# Patient Record
Sex: Female | Born: 1985
Health system: Southern US, Community
[De-identification: ages and names within clinical notes are randomized; demographics above are authoritative.]

## PROBLEM LIST (undated history)

## (undated) ENCOUNTER — Inpatient Hospital Stay (HOSPITAL_COMMUNITY): Payer: Self-pay

## (undated) DIAGNOSIS — Z9889 Other specified postprocedural states: Secondary | ICD-10-CM

## (undated) DIAGNOSIS — D649 Anemia, unspecified: Secondary | ICD-10-CM

## (undated) DIAGNOSIS — R112 Nausea with vomiting, unspecified: Secondary | ICD-10-CM

## (undated) DIAGNOSIS — N189 Chronic kidney disease, unspecified: Secondary | ICD-10-CM

## (undated) DIAGNOSIS — N803 Endometriosis of pelvic peritoneum: Secondary | ICD-10-CM

## (undated) DIAGNOSIS — F419 Anxiety disorder, unspecified: Secondary | ICD-10-CM

## (undated) HISTORY — PX: BREAST ENHANCEMENT SURGERY: SHX7

## (undated) HISTORY — PX: BREAST SURGERY: SHX581

## (undated) HISTORY — DX: Endometriosis of pelvic peritoneum: N80.3

## (undated) HISTORY — DX: Anxiety disorder, unspecified: F41.9

---

## 2004-08-13 ENCOUNTER — Emergency Department (HOSPITAL_COMMUNITY): Admission: EM | Admit: 2004-08-13 | Discharge: 2004-08-14 | Payer: Self-pay | Admitting: Emergency Medicine

## 2008-02-29 ENCOUNTER — Emergency Department (HOSPITAL_COMMUNITY): Admission: EM | Admit: 2008-02-29 | Discharge: 2008-02-29 | Payer: Self-pay | Admitting: Emergency Medicine

## 2008-05-02 ENCOUNTER — Inpatient Hospital Stay (HOSPITAL_COMMUNITY): Admission: AD | Admit: 2008-05-02 | Discharge: 2008-05-02 | Payer: Self-pay | Admitting: Obstetrics

## 2008-05-15 ENCOUNTER — Observation Stay (HOSPITAL_COMMUNITY): Admission: AD | Admit: 2008-05-15 | Discharge: 2008-05-16 | Payer: Self-pay | Admitting: Obstetrics

## 2008-06-14 ENCOUNTER — Inpatient Hospital Stay (HOSPITAL_COMMUNITY): Admission: RE | Admit: 2008-06-14 | Discharge: 2008-06-17 | Payer: Self-pay | Admitting: Obstetrics

## 2010-06-04 ENCOUNTER — Ambulatory Visit (HOSPITAL_COMMUNITY)
Admission: RE | Admit: 2010-06-04 | Discharge: 2010-06-04 | Disposition: A | Discharge status: Home / Self Care | Payer: Self-pay | Source: Ambulatory Visit | Attending: Family Medicine | Admitting: Family Medicine

## 2010-06-04 ENCOUNTER — Other Ambulatory Visit: Payer: Self-pay | Admitting: Family Medicine

## 2010-06-04 DIAGNOSIS — D72829 Elevated white blood cell count, unspecified: Secondary | ICD-10-CM | POA: Insufficient documentation

## 2010-06-04 DIAGNOSIS — R112 Nausea with vomiting, unspecified: Secondary | ICD-10-CM | POA: Insufficient documentation

## 2010-06-04 DIAGNOSIS — K7689 Other specified diseases of liver: Secondary | ICD-10-CM | POA: Insufficient documentation

## 2010-06-04 DIAGNOSIS — R1032 Left lower quadrant pain: Secondary | ICD-10-CM | POA: Insufficient documentation

## 2010-06-04 MED ORDER — IOHEXOL 300 MG/ML  SOLN
100.0000 mL | Freq: Once | INTRAMUSCULAR | Status: AC | PRN
  Administered 2010-06-04: 100 mL via INTRAVENOUS

## 2010-07-24 LAB — CBC
HCT: 36.8 % (ref 36.0–46.0)
Hemoglobin: 12.2 g/dL (ref 12.0–15.0)
Hemoglobin: 9 g/dL — ABNORMAL LOW (ref 12.0–15.0)
MCV: 86.2 fL (ref 78.0–100.0)
Platelets: 289 10*3/uL (ref 150–400)
RBC: 3.1 MIL/uL — ABNORMAL LOW (ref 3.87–5.11)
RDW: 17 % — ABNORMAL HIGH (ref 11.5–15.5)
WBC: 11.7 10*3/uL — ABNORMAL HIGH (ref 4.0–10.5)
WBC: 11.8 10*3/uL — ABNORMAL HIGH (ref 4.0–10.5)

## 2010-07-28 LAB — WET PREP, GENITAL
Trich, Wet Prep: NONE SEEN
Yeast Wet Prep HPF POC: NONE SEEN

## 2010-07-28 LAB — FETAL FIBRONECTIN: Fetal Fibronectin: NEGATIVE

## 2010-07-28 LAB — URINALYSIS, ROUTINE W REFLEX MICROSCOPIC
Bilirubin Urine: NEGATIVE
Glucose, UA: NEGATIVE mg/dL
Hgb urine dipstick: NEGATIVE
Protein, ur: NEGATIVE mg/dL

## 2010-07-29 LAB — CROSSMATCH
ABO/RH(D): O POS
Antibody Screen: NEGATIVE

## 2010-07-29 LAB — CBC
HCT: 31 % — ABNORMAL LOW (ref 36.0–46.0)
Hemoglobin: 10.5 g/dL — ABNORMAL LOW (ref 12.0–15.0)
MCHC: 33.8 g/dL (ref 30.0–36.0)
MCV: 84.3 fL (ref 78.0–100.0)
Platelets: 290 K/uL (ref 150–400)
RBC: 3.68 MIL/uL — ABNORMAL LOW (ref 3.87–5.11)
RDW: 14.1 % (ref 11.5–15.5)
WBC: 13 K/uL — ABNORMAL HIGH (ref 4.0–10.5)

## 2010-08-26 NOTE — Op Note (Signed)
Shelly [name], Shelly [name]             ACCOUNT NO.:  [id number]   MEDICAL RECORD NO.:  [id number]          PATIENT TYPE:  INP   LOCATION:  NA                            FACILITY:  [facility]   PHYSICIAN:  Kathreen Cosier, M.D.DATE OF BIRTH:  02-18-1986   DATE OF PROCEDURE:  06/14/2008  DATE OF DISCHARGE:                               OPERATIVE REPORT   PREOPERATIVE DIAGNOSIS:  Previous cesarean section at term, desires  repeat.   POSTOPERATIVE DIAGNOSES:  Previous cesarean section at term, desires  repeat.   SURGEON:  Kathreen Cosier, M.D.   FIRST ASSISTANT:  Charles A. Clearance Coots, M.D.   ANESTHESIA:  Spinal.   PROCEDURE:  The patient placed on the operating table in supine position  after the spinal administered.  Abdomen prepped and draped. Bladder  emptied with a Foley catheter. Transverse suprapubic incision was made,  carried down to the rectus fascia.  Fascia cleaned and incised to the  length of the incision. Rectus muscles retracted laterally. The  peritoneum was incised longitudinally.  Transverse incision was made in  the visceral peritoneum above the bladder.  Bladder mobilized  inferiorly.  Transverse lower uterine incision was made and the fluid  was clear.  The patient delivered from the OP position of a female, Apgar  9, 9, weighing 7 pounds 8 ounces.  The team was in attendance and the  fluid was cleared.  The placenta was posterior fundal and removed  manually and sent to labor and delivery.  Uterine cavity cleaned with  dry laps.  Uterine incision closed in one layer with continuous suture  of #1 chromic.  Hemostasis satisfactory.  Bladder flap reattached with 2-  0 chromic.  Uterus well contracted.  Tubes and ovaries normal.  Abdomen  closed in layers, peritoneum continuous suture of O chromic fascia,  continuous suture with Dexon, and the skin closed with subcuticular  stitch of 4-0 Monocryl.  Blood loss 400 mL.  The patient tolerated the  procedure well, taken to  recovery room in good condition.           ______________________________  Kathreen Cosier, M.D.     BAM/MEDQ  D:  06/14/2008  T:  06/14/2008  Job:  [id number]

## 2010-08-29 NOTE — Discharge Summary (Signed)
Shelly [name], Shelly [name]             ACCOUNT NO.:  [id number]   MEDICAL RECORD NO.:  [id number]          PATIENT TYPE:  INP   LOCATION:  9117                          FACILITY:  [facility]   PHYSICIAN:  Kathreen Cosier, M.D.DATE OF BIRTH:  [birth year]   DATE OF ADMISSION:  06/14/2008  DATE OF DISCHARGE:  06/17/2008                               DISCHARGE SUMMARY   The patient is a 25 year old gravida 3, para 2-0-2, EDC on June 21, 2008, with 2 previous C-sections, and she was now at term and was in for  a repeat C-section.  During the pregnancy, her hemoglobin fell to 8 and  she was symptomatic and got transfused 2 units of packed cells  understanding the risk of transfusion, this was done on May 15, 2008.  Post-transfusion hemoglobin was 10.  The patient underwent a  repeat low transverse cesarean section on June 14, 2008, and had a female,  Apgar 9 and 9 weighing 7 pounds 8 ounces from the OP position.  Postoperatively, she did well.  Her hemoglobin was 9.  RPR negative.  HIV negative.  She was discharged home on the third postoperative day,  ambulatory on a regular diet, to see me in 6 weeks.  She was on Tylox  for pain and discharge diagnosis status post previous cesarean section  at term desired repeat.           ______________________________  Kathreen Cosier, M.D.     BAM/MEDQ  D:  07/04/2008  T:  07/04/2008  Job:  [id number]

## 2011-01-14 LAB — URINALYSIS, ROUTINE W REFLEX MICROSCOPIC
Glucose, UA: NEGATIVE
Hgb urine dipstick: NEGATIVE
Protein, ur: NEGATIVE
Specific Gravity, Urine: 1.01
pH: 8

## 2011-01-14 LAB — POCT I-STAT, CHEM 8
BUN: 6
Calcium, Ion: 1.18
Chloride: 108
Creatinine, Ser: 0.7
Sodium: 139

## 2011-01-14 LAB — CBC
HCT: 28.5 — ABNORMAL LOW
Hemoglobin: 9.7 — ABNORMAL LOW
RBC: 3.19 — ABNORMAL LOW
RDW: 13.6
WBC: 10.2

## 2011-01-14 LAB — PREGNANCY, URINE: Preg Test, Ur: POSITIVE

## 2011-01-14 LAB — D-DIMER, QUANTITATIVE: D-Dimer, Quant: 0.36

## 2011-01-14 LAB — DIFFERENTIAL
Basophils Absolute: 0
Eosinophils Relative: 1
Lymphocytes Relative: 12
Lymphs Abs: 1.2
Monocytes Absolute: 0.5
Neutro Abs: 8.4 — ABNORMAL HIGH

## 2011-02-25 ENCOUNTER — Other Ambulatory Visit: Payer: Self-pay | Admitting: Family Medicine

## 2011-02-25 ENCOUNTER — Ambulatory Visit
Admission: RE | Admit: 2011-02-25 | Discharge: 2011-02-25 | Disposition: A | Discharge status: Home / Self Care | Payer: Self-pay | Source: Ambulatory Visit | Attending: Family Medicine | Admitting: Family Medicine

## 2011-02-25 DIAGNOSIS — R519 Headache, unspecified: Secondary | ICD-10-CM

## 2011-02-26 ENCOUNTER — Other Ambulatory Visit: Payer: Self-pay

## 2011-09-22 ENCOUNTER — Ambulatory Visit: Payer: Self-pay | Admitting: Physician Assistant

## 2011-09-22 VITALS — BP 108/76 | HR 61 | Temp 98.7°F | Resp 16 | Ht 61.0 in | Wt 115.0 lb

## 2011-09-22 DIAGNOSIS — J029 Acute pharyngitis, unspecified: Secondary | ICD-10-CM

## 2011-09-22 DIAGNOSIS — R059 Cough, unspecified: Secondary | ICD-10-CM

## 2011-09-22 DIAGNOSIS — R05 Cough: Secondary | ICD-10-CM

## 2011-09-22 LAB — POCT RAPID STREP A (OFFICE): Rapid Strep A Screen: NEGATIVE

## 2011-09-22 MED ORDER — AMOXICILLIN 875 MG PO TABS: 875.0000 mg | ORAL_TABLET | Freq: Two times a day (BID) | ORAL | Status: AC

## 2011-09-22 MED ORDER — BENZONATATE 100 MG PO CAPS: 100.0000 mg | ORAL_CAPSULE | Freq: Three times a day (TID) | ORAL | Status: AC | PRN

## 2011-09-22 NOTE — Progress Notes (Signed)
  Subjective:    Patient ID: Shelly [name], female    DOB: April 23, 1985, 26 y.o.   MRN: [id number]  HPI Patient presents with sore throat, fever, chills, and slight cough x 7 days. States her symptoms started as a sore throat and fever. Last documented fever was 09/16/11 and was 100.0. She has had persistent congestion, fatigue, and slight cough. Denies nausea, vomiting, headache, sinus pain/pressure, or otalgia. She has been taking ibuprofen as needed for her sore throat which has not helped. States that at the onset of the illness she had white patches on the back of her throat and it has been difficulty with eating and swallowing.     Review of Systems  All other systems reviewed and are negative.       Objective:   Physical Exam  Constitutional: She is oriented to person, place, and time. She appears well-developed and well-nourished.  HENT:  Head: Normocephalic and atraumatic.  Right Ear: Hearing, tympanic membrane, external ear and ear canal normal.  Left Ear: Hearing, tympanic membrane, external ear and ear canal normal.       Bilateral erythema. No exudate. +clear postnasal drainage.   Eyes: Conjunctivae are normal.  Neck: Normal range of motion. Neck supple.  Cardiovascular: Normal rate, regular rhythm and normal heart sounds.   Pulmonary/Chest: Effort normal and breath sounds normal.  Lymphadenopathy:    She has no cervical adenopathy.  Neurological: She is alert and oriented to person, place, and time.  Skin: Skin is warm and dry.  Psychiatric: She has a normal mood and affect. Her behavior is normal. Judgment and thought content normal.          Assessment & Plan:   1. Acute pharyngitis  Will treat with amoxicillin 875 mg bid x 7 days.  Continue tylenol or ibuprofen as needed for pain.  Follow up in 48 hours if symptoms persist, sooner if worse.  POCT rapid strep A, amoxicillin (AMOXIL) 875 MG tablet  2. Cough  benzonatate (TESSALON) 100 MG capsule

## 2012-02-12 ENCOUNTER — Encounter: Payer: Self-pay | Admitting: Family Medicine

## 2012-02-12 ENCOUNTER — Ambulatory Visit (INDEPENDENT_AMBULATORY_CARE_PROVIDER_SITE_OTHER): Payer: PRIVATE HEALTH INSURANCE | Admitting: Family Medicine

## 2012-02-12 VITALS — BP 110/68 | HR 88 | Temp 99.0°F | Resp 16 | Ht 61.0 in | Wt 117.0 lb

## 2012-02-12 DIAGNOSIS — R5383 Other fatigue: Secondary | ICD-10-CM

## 2012-02-12 DIAGNOSIS — N921 Excessive and frequent menstruation with irregular cycle: Secondary | ICD-10-CM

## 2012-02-12 DIAGNOSIS — R110 Nausea: Secondary | ICD-10-CM

## 2012-02-12 DIAGNOSIS — N920 Excessive and frequent menstruation with regular cycle: Secondary | ICD-10-CM

## 2012-02-12 DIAGNOSIS — R140 Abdominal distension (gaseous): Secondary | ICD-10-CM

## 2012-02-12 DIAGNOSIS — R141 Gas pain: Secondary | ICD-10-CM

## 2012-02-12 LAB — CBC WITH DIFFERENTIAL/PLATELET
HCT: 34.4 % — ABNORMAL LOW (ref 36.0–46.0)
Hemoglobin: 11.8 g/dL — ABNORMAL LOW (ref 12.0–15.0)
Lymphs Abs: 1.6 10*3/uL (ref 0.7–4.0)
Monocytes Absolute: 0.5 10*3/uL (ref 0.1–1.0)
Monocytes Relative: 7 % (ref 3–12)
Neutro Abs: 4.7 10*3/uL (ref 1.7–7.7)
Neutrophils Relative %: 68 % (ref 43–77)
RBC: 4.29 MIL/uL (ref 3.87–5.11)

## 2012-02-12 LAB — TSH: TSH: 1.064 u[IU]/mL (ref 0.350–4.500)

## 2012-02-13 LAB — COMPREHENSIVE METABOLIC PANEL
Albumin: 4.5 g/dL (ref 3.5–5.2)
CO2: 28 mEq/L (ref 19–32)
Calcium: 9.1 mg/dL (ref 8.4–10.5)
Chloride: 103 mEq/L (ref 96–112)
Glucose, Bld: 75 mg/dL (ref 70–99)
Potassium: 3.9 mEq/L (ref 3.5–5.3)
Sodium: 140 mEq/L (ref 135–145)
Total Protein: 7.4 g/dL (ref 6.0–8.3)

## 2012-02-15 LAB — H. PYLORI ANTIBODY, IGG: H Pylori IgG: 0.53 {ISR}

## 2012-02-17 NOTE — Progress Notes (Signed)
  Subjective:    Patient ID: Shelly [name], female    DOB: [birth year], 26 y.o.   MRN: [id number] Chief Complaint  Patient presents with  . Abdominal Pain    HPI  Intermittent RLQ abd pain for the past yr. Not associated w/ menses. Unsure if associated w/ food. Now becoming bloated as well. Normal stools.  Does have a h/o ovarian cysts.  No past medical history on file.   Review of Systems  Constitutional: Positive for activity change, appetite change and fatigue. Negative for fever, chills and unexpected weight change.  Respiratory: Negative for shortness of breath.   Cardiovascular: Negative for chest pain and leg swelling.  Gastrointestinal: Positive for nausea, abdominal pain and abdominal distention. Negative for vomiting, diarrhea, constipation, blood in stool and anal bleeding.  Genitourinary: Positive for vaginal bleeding, menstrual problem and pelvic pain. Negative for dysuria, decreased urine volume, vaginal discharge and difficulty urinating.  Musculoskeletal: Negative for gait problem.  Skin: Negative for rash.  Hematological: Negative for adenopathy. Does not bruise/bleed easily.  Psychiatric/Behavioral: Negative for sleep disturbance.      BP 110/68  Pulse 88  Temp 99 F (37.2 C) (Oral)  Resp 16  Ht 5\' 1"  (1.549 m)  Wt 117 lb (53.071 kg)  BMI 22.11 kg/m2  SpO2 100% Objective:   Physical Exam  Constitutional: She is oriented to person, place, and time. She appears well-developed and well-nourished. No distress.  HENT:  Head: Normocephalic and atraumatic.  Neck: Normal range of motion. Neck supple. No thyromegaly present.  Cardiovascular: Normal rate, regular rhythm, normal heart sounds and intact distal pulses.   Pulmonary/Chest: Effort normal and breath sounds normal. No respiratory distress.  Abdominal: Soft. Bowel sounds are normal. She exhibits no distension and no mass. There is no tenderness. There is no rebound, no guarding and no CVA tenderness.    Musculoskeletal: She exhibits no edema.  Lymphadenopathy:    She has no cervical adenopathy.  Neurological: She is alert and oriented to person, place, and time.  Skin: Skin is warm and dry. She is not diaphoretic. No erythema.  Psychiatric: She has a normal mood and affect. Her behavior is normal.          Assessment & Plan:   1. Bloating  Comprehensive metabolic panel, H. pylori antibody, IgG, Tissue transglutaminase, IgG, Lipase  2. Fatigue  CBC with Differential, Comprehensive metabolic panel, TSH, Lipase  3. Nausea  H. pylori antibody, IgG  4. Metrorrhagia    5. Menorrhagia

## 2012-02-22 ENCOUNTER — Emergency Department (HOSPITAL_COMMUNITY)
Admission: EM | Admit: 2012-02-22 | Discharge: 2012-02-22 | Disposition: A | Discharge status: Home / Self Care | Payer: PRIVATE HEALTH INSURANCE | Attending: Emergency Medicine | Admitting: Emergency Medicine

## 2012-02-22 ENCOUNTER — Encounter (HOSPITAL_COMMUNITY): Payer: Self-pay | Admitting: Emergency Medicine

## 2012-02-22 ENCOUNTER — Emergency Department (HOSPITAL_COMMUNITY): Payer: PRIVATE HEALTH INSURANCE

## 2012-02-22 DIAGNOSIS — N83209 Unspecified ovarian cyst, unspecified side: Secondary | ICD-10-CM | POA: Insufficient documentation

## 2012-02-22 DIAGNOSIS — N83201 Unspecified ovarian cyst, right side: Secondary | ICD-10-CM

## 2012-02-22 DIAGNOSIS — Z3202 Encounter for pregnancy test, result negative: Secondary | ICD-10-CM | POA: Insufficient documentation

## 2012-02-22 DIAGNOSIS — Z9889 Other specified postprocedural states: Secondary | ICD-10-CM | POA: Insufficient documentation

## 2012-02-22 LAB — COMPREHENSIVE METABOLIC PANEL
Alkaline Phosphatase: 53 U/L (ref 39–117)
BUN: 12 mg/dL (ref 6–23)
Chloride: 102 mEq/L (ref 96–112)
GFR calc Af Amer: >90 mL/min (ref >90)
Glucose, Bld: 85 mg/dL (ref 70–99)
Potassium: 3.9 mEq/L (ref 3.5–5.1)
Total Bilirubin: 0.5 mg/dL (ref 0.3–1.2)

## 2012-02-22 LAB — PREGNANCY, URINE: Preg Test, Ur: NEGATIVE

## 2012-02-22 LAB — CBC WITH DIFFERENTIAL/PLATELET
Basophils Relative: 1 % (ref 0–1)
Eosinophils Absolute: 0.1 10*3/uL (ref 0.0–0.7)
Eosinophils Relative: 1 % (ref 0–5)
Lymphs Abs: 1.7 10*3/uL (ref 0.7–4.0)
MCH: 27.7 pg (ref 26.0–34.0)
MCHC: 33.2 g/dL (ref 30.0–36.0)
MCV: 83.7 fL (ref 78.0–100.0)
Platelets: 354 10*3/uL (ref 150–400)
RBC: 4.47 MIL/uL (ref 3.87–5.11)
RDW: 13.5 % (ref 11.5–15.5)

## 2012-02-22 LAB — LIPASE, BLOOD: Lipase: 52 U/L (ref 11–59)

## 2012-02-22 LAB — WET PREP, GENITAL
Trich, Wet Prep: NONE SEEN
Yeast Wet Prep HPF POC: NONE SEEN

## 2012-02-22 LAB — URINALYSIS, MICROSCOPIC ONLY
Bilirubin Urine: NEGATIVE
Ketones, ur: NEGATIVE mg/dL
Nitrite: NEGATIVE
Urobilinogen, UA: 0.2 mg/dL (ref 0.0–1.0)

## 2012-02-22 MED ORDER — ONDANSETRON HCL 4 MG/2ML IJ SOLN
4.0000 mg | Freq: Once | INTRAMUSCULAR | Status: AC
  Administered 2012-02-22: 4 mg via INTRAVENOUS
  Filled 2012-02-22: qty 2

## 2012-02-22 MED ORDER — HYDROCODONE-ACETAMINOPHEN 5-325 MG PO TABS: 1.0000 | ORAL_TABLET | Freq: Four times a day (QID) | ORAL | Status: DC | PRN

## 2012-02-22 NOTE — ED Notes (Signed)
Pt c/o of lower right abdominal pain. States she has had pain there for about a year but was tolerable but has gotten worse since last night. Also states that she has been bloated a lot the past 2 weeks.

## 2012-02-22 NOTE — ED Provider Notes (Signed)
History     CSN: [id number]  Arrival date & time 02/22/12  8756   First MD Initiated Contact with Patient 02/22/12 1011      Chief Complaint  Patient presents with  . Abdominal Pain    (Consider location/radiation/quality/duration/timing/severity/associated sxs/prior treatment) HPI Pt is a 26 yo female with history of an ovarian cyst in 2008 who presents to the ER with abdominal pain.  Pain has been occuring on and off for the past year.  Pain is located in RLQ.  Pt states that at times she has seen bulging in that area.  Pt complains of dyspareunia.  Pt has had very heavy irregular periods for the past year.  Her periods were regular before this.  Pt does not associate pain with menstrual cycle.  Pt is in a monogamous relationship.  Last LMP was 02/05/12 and lasted 6 days.  Pt is not on any birth control.  Pt has also experienced some bloating for the past 2 weeks.  Pt denies vaginal bleeding and discharge, fever, chills chest pain, SOB, diarrhea, flank pain, dysuria, and hematuria.  She has not seen an OB/GYN concerning this issue.  History reviewed. No pertinent past medical history.  Past Surgical History  Procedure Date  . Cesarean section     No family history on file.  History  Substance Use Topics  . Smoking status: Never Smoker   . Smokeless tobacco: Not on file  . Alcohol Use: No    OB History    Grav Para Term Preterm Abortions TAB SAB Ect Mult Living                  Review of Systems All other systems negative except as documented in the HPI. All pertinent positives and negatives as reviewed in the HPI.  Allergies  Review of patient's allergies indicates no known allergies.  Home Medications   Current Outpatient Rx  Name  Route  Sig  Dispense  Refill  . IBUPROFEN 100 MG PO TABS   Oral   Take 100 mg by mouth every 6 (six) hours as needed.           Pulse 111  Temp 98.8 F (37.1 C) (Oral)  Resp 20  SpO2 99%  LMP 02/05/2012  Physical Exam    Constitutional: She is oriented to person, place, and time. She appears well-developed and well-nourished. No distress.  HENT:  Head: Normocephalic and atraumatic.  Mouth/Throat: Oropharynx is clear and moist.  Eyes: Pupils are equal, round, and reactive to light.  Cardiovascular: Normal rate, regular rhythm and normal heart sounds.  Exam reveals no gallop and no friction rub.   No murmur heard. Pulmonary/Chest: Effort normal and breath sounds normal. No respiratory distress.  Abdominal: Soft. Bowel sounds are normal. She exhibits no distension and no mass. There is tenderness in the right lower quadrant. There is no rebound, no guarding and negative Murphy's sign.  Genitourinary: Vagina normal. There is no rash, tenderness or injury on the right labia. There is no rash, tenderness, lesion or injury on the left labia. Cervix exhibits no motion tenderness, no discharge and no friability. Right adnexum displays tenderness. Right adnexum displays no mass and no fullness. Left adnexum displays no mass, no tenderness and no fullness. No erythema, tenderness or bleeding around the vagina. No foreign body around the vagina. No signs of injury around the vagina. No vaginal discharge found.  Neurological: She is alert and oriented to person, place, and time.  Skin: Skin  is warm and dry. No rash noted.    ED Course  Procedures (including critical care time)   Labs Reviewed  CBC WITH DIFFERENTIAL  COMPREHENSIVE METABOLIC PANEL  LIPASE, BLOOD  URINALYSIS, MICROSCOPIC ONLY   The patient has pain in the right lower quadrant that is most likely due to an ovarian cyst.  Patient, states, that she's had this pain, off and on for quite a while.  Patient will be referred to GYN for close followup. Told to return here as needed.   MDM   MDM Reviewed: vitals and nursing note Interpretation: labs and ultrasound          Carlyle Dolly, PA-C 02/22/12 1352

## 2012-02-22 NOTE — ED Notes (Signed)
Pt presenting to ed with c/o lower right side abdominal pain x 1 year. Pt states pain worse last night. Pt states "in 2008 I was diagnosed with a cyst and it feels the same and it's in the same spot" pt states positive nausea no vomiting no diarrhea. Pt denies hematuria pt states normal bowel movement yesterday

## 2012-02-23 LAB — GC/CHLAMYDIA PROBE AMP, GENITAL
Chlamydia, DNA Probe: NEGATIVE
GC Probe Amp, Genital: NEGATIVE

## 2012-02-25 NOTE — ED Provider Notes (Signed)
Medical screening examination/treatment/procedure(s) were performed by non-physician practitioner and as supervising physician I was immediately available for consultation/collaboration.  Nilton Lave T Thane Age, MD 02/25/12 2317 

## 2012-03-03 ENCOUNTER — Encounter: Payer: Self-pay | Admitting: Obstetrics & Gynecology

## 2012-03-03 ENCOUNTER — Ambulatory Visit (INDEPENDENT_AMBULATORY_CARE_PROVIDER_SITE_OTHER): Payer: PRIVATE HEALTH INSURANCE | Admitting: Obstetrics & Gynecology

## 2012-03-03 VITALS — BP 113/78 | HR 98 | Temp 97.5°F | Ht 61.0 in | Wt 116.0 lb

## 2012-03-03 DIAGNOSIS — N83209 Unspecified ovarian cyst, unspecified side: Secondary | ICD-10-CM

## 2012-03-03 DIAGNOSIS — N949 Unspecified condition associated with female genital organs and menstrual cycle: Secondary | ICD-10-CM

## 2012-03-03 DIAGNOSIS — G8929 Other chronic pain: Secondary | ICD-10-CM | POA: Insufficient documentation

## 2012-03-03 MED ORDER — DICLOFENAC SODIUM 75 MG PO TBEC: 75.0000 mg | DELAYED_RELEASE_TABLET | Freq: Two times a day (BID) | ORAL | Status: DC

## 2012-03-03 NOTE — Patient Instructions (Addendum)
Laparoscopy Laparoscopy is a surgical procedure. It is used to diagnose and treat diseases inside the belly(abdomen). It is usually a brief, common, and relatively simple procedure. The laparoscopeis a thin, lighted, pencil-sized instrument. It is like a telescope. It is inserted into your abdomen through a small cut (incision). Your caregiver can look at the organs inside your body through this instrument. He or she can see if there is anything abnormal. Laparoscopy can be done either in a hospital or outpatient clinic. You may be given a mild sedative to help you relax before the procedure. Once in the operating room, you will be given a drug to make you sleep (general anesthesia). Laparoscopy usually lasts less than 1 hour. After the procedure, you will be monitored in a recovery area until you are stable and doing well. Once you are home, it will take 2 to 3 days to fully recover. RISKS AND COMPLICATIONS  Laparoscopy has relatively few risks. Your caregiver will discuss the risks with you before the procedure. Some problems that can occur include:  Infection.  Bleeding.  Damage to other organs.  Anesthetic side effects. PROCEDURE Once you receive anesthesia, your surgeon inflates the abdomen with a harmless gas (carbon dioxide). This makes the organs easier to see. The laparoscope is inserted into the abdomen through a small incision. This allows your surgeon to see into the abdomen. Other small instruments are also inserted into the abdomen through other small openings. Many surgeons attach a video camera to the laparoscope to enlarge the view. During a diagnostic laparoscopy, the surgeon may be looking for inflammation, infection, or cancer. Your surgeon may take tissue samples(biopsies). The samples are sent to a specialist in looking at cells and tissue samples (pathologist). The pathologist examines them under a microscope. Biopsies can help to diagnose or confirm a disease. AFTER THE  PROCEDURE   The gas is released from inside the abdomen.  The incisions are closed with stitches (sutures). Because these incisions are small (usually less than 1/2 inch), there is usually minimal discomfort after the procedure. There may be some mild discomfort in the throat. This is from the tube placed in the throat while you were sleeping. You may have some mild abdominal discomfort. There may also be discomfort from the instrument placement incisions in the abdomen.  The recovery time is shortened as long as there are no complications.  You will rest in a recovery room until stable and doing well. As long as there are no complications, you may be allowed to go home. FINDING OUT THE RESULTS OF YOUR TEST Not all test results are available during your visit. If your test results are not back during the visit, make an appointment with your caregiver to find out the results. Do not assume everything is normal if you have not heard from your caregiver or the medical facility. It is important for you to follow up on all of your test results. HOME CARE INSTRUCTIONS   Take all medicines as directed.  Only take over-the-counter or prescription medicines for pain, discomfort, or fever as directed by your caregiver.  Resume daily activities as directed.  Showers are preferred over baths.  You may resume sexual activities in 1 week or as directed.  Do not drive while taking narcotics. SEEK MEDICAL CARE IF:   There is increasing abdominal pain.  There is new pain in the shoulders (shoulder strap areas).  You feel lightheaded or faint.  You have the chills.  You or your child  has an oral temperature above 102 F (38.9 C).  There is pus-like (purulent) drainage from any of the wounds.  You are unable to pass gas or have a bowel movement.  You feel sick to your stomach (nauseous) or throw up (vomit). MAKE SURE YOU:   Understand these instructions.  Will watch your condition.  Will  get help right away if you are not doing well or get worse. Document Released: 07/06/2000 Document Revised: 06/22/2011 Document Reviewed: 03/30/2007 [facility] Patient Information 2013 [place], [place].   Ovarian Cyst The ovaries are small organs that are on each side of the uterus. The ovaries are the organs that produce the female hormones, estrogen and progesterone. An ovarian cyst is a sac filled with fluid that can vary in its size. It is normal for a small cyst to form in women who are in the childbearing age and who have menstrual periods. This type of cyst is called a follicle cyst that becomes an ovulation cyst (corpus luteum cyst) after it produces the women's egg. It later goes away on its own if the woman does not become pregnant. There are other kinds of ovarian cysts that may cause problems and may need to be treated. The most serious problem is a cyst with cancer. It should be noted that menopausal women who have an ovarian cyst are at a higher risk of it being a cancer cyst. They should be evaluated very quickly, thoroughly and followed closely. This is especially true in menopausal women because of the high rate of ovarian cancer in women in menopause. CAUSES AND TYPES OF OVARIAN CYSTS:  FUNCTIONAL CYST: The follicle/corpus luteum cyst is a functional cyst that occurs every month during ovulation with the menstrual cycle. They go away with the next menstrual cycle if the woman does not get pregnant. Usually, there are no symptoms with a functional cyst.  ENDOMETRIOMA CYST: This cyst develops from the lining of the uterus tissue. This cyst gets in or on the ovary. It grows every month from the bleeding during the menstrual period. It is also called a "chocolate cyst" because it becomes filled with blood that turns brown. This cyst can cause pain in the lower abdomen during intercourse and with your menstrual period.  CYSTADENOMA CYST: This cyst develops from the cells on the outside of the  ovary. They usually are not cancerous. They can get very big and cause lower abdomen pain and pain with intercourse. This type of cyst can twist on itself, cut off its blood supply and cause severe pain. It also can easily rupture and cause a lot of pain.  DERMOID CYST: This type of cyst is sometimes found in both ovaries. They are found to have different kinds of body tissue in the cyst. The tissue includes skin, teeth, hair, and/or cartilage. They usually do not have symptoms unless they get very big. Dermoid cysts are rarely cancerous.  POLYCYSTIC OVARY: This is a rare condition with hormone problems that produces many small cysts on both ovaries. The cysts are follicle-like cysts that never produce an egg and become a corpus luteum. It can cause an increase in body weight, infertility, acne, increase in body and facial hair and lack of menstrual periods or rare menstrual periods. Many women with this problem develop type 2 diabetes. The exact cause of this problem is unknown. A polycystic ovary is rarely cancerous.  THECA LUTEIN CYST: Occurs when too much hormone (human chorionic gonadotropin) is produced and over-stimulates the ovaries to produce  an egg. They are frequently seen when doctors stimulate the ovaries for invitro-fertilization (test tube babies).  LUTEOMA CYST: This cyst is seen during pregnancy. Rarely it can cause an obstruction to the birth canal during labor and delivery. They usually go away after delivery. SYMPTOMS   Pelvic pain or pressure.  Pain during sexual intercourse.  Increasing girth (swelling) of the abdomen.  Abnormal menstrual periods.  Increasing pain with menstrual periods.  You stop having menstrual periods and you are not pregnant. DIAGNOSIS  The diagnosis can be made during:  Routine or annual pelvic examination (common).  Ultrasound.  X-ray of the pelvis.  CT Scan.  MRI.  Blood tests. TREATMENT   Treatment may only be to follow the cyst  monthly for 2 to 3 months with your caregiver. Many go away on their own, especially functional cysts.  May be aspirated (drained) with a long needle with ultrasound, or by laparoscopy (inserting a tube into the pelvis through a small incision).  The whole cyst can be removed by laparoscopy.  Sometimes the cyst may need to be removed through an incision in the lower abdomen.  Hormone treatment is sometimes used to help dissolve certain cysts.  Birth control pills are sometimes used to help dissolve certain cysts. HOME CARE INSTRUCTIONS  Follow your caregiver's advice regarding:  Medicine.  Follow up visits to evaluate and treat the cyst.  You may need to come back or make an appointment with another caregiver, to find the exact cause of your cyst, if your caregiver is not a gynecologist.  Get your yearly and recommended pelvic examinations and Pap tests.  Let your caregiver know if you have had an ovarian cyst in the past. SEEK MEDICAL CARE IF:   Your periods are late, irregular, they stop, or are painful.  Your stomach (abdomen) or pelvic pain does not go away.  Your stomach becomes larger or swollen.  You have pressure on your bladder or trouble emptying your bladder completely.  You have painful sexual intercourse.  You have feelings of fullness, pressure, or discomfort in your stomach.  You lose weight for no apparent reason.  You feel generally ill.  You become constipated.  You lose your appetite.  You develop acne.  You have an increase in body and facial hair.  You are gaining weight, without changing your exercise and eating habits.  You think you are pregnant. SEEK IMMEDIATE MEDICAL CARE IF:   You have increasing abdominal pain.  You feel sick to your stomach (nausea) and/or vomit.  You develop a fever that comes on suddenly.  You develop abdominal pain during a bowel movement.  Your menstrual periods become heavier than usual. Document  Released: 03/30/2005 Document Revised: 06/22/2011 Document Reviewed: 01/31/2009 [facility] Patient Information 2013 [place], [place].

## 2012-03-03 NOTE — Progress Notes (Signed)
History:  26 y.o. G3P3003 here today for followup after being evaluated in the ED on 02/22/12 for chronic pelvic pain, ultrasound showed a 1.8 cm likely corpus luteum cyst.  She reports that she has had pelvic pain, mostly on her right side, for over one year.  She reports daily pain, worse during intercourse, has tried different positions but always has pain.  She is concerned that "something is wrong" and is interested in surgical evaluation for her pain.  She understands that her pain is out of proportion to pain cause by a small cyst, also this pain has been present for over a year.  Patient also reports having infertility problems, she was told she needs a separate appointment to discuss any infertility issues.   The following portions of the patient's history were reviewed and updated as appropriate: allergies, current medications, past family history, past medical history, past social history, past surgical history and problem list.  Review of Systems:  Pertinent items are noted in HPI.  Objective:  Physical Exam Blood pressure 113/78, pulse 98, temperature 97.5 F (36.4 C), temperature source Oral, height 5\' 1"  (1.549 m), weight 116 lb (52.617 kg), last menstrual period 02/05/2012. Gen: NAD Abd: Soft, well-healed Pfannensteil incisions, moderate tenderness in RLQ around her right incisional edge, no masses palpated Pelvic: Normal appearing external genitalia; normal appearing vaginal mucosa and cervix.   Small uterus, normal adnexa. Diffuse uterine and adnexal tenderness on bimanual exam especially on the right.  Labs and Imaging 02/22/2012  TRANSABDOMINAL AND TRANSVAGINAL ULTRASOUND OF PELVIS  Clinical Data: Pelvic pain  Technique:  Both transabdominal and transvaginal ultrasound examinations of the pelvis were performed. Transabdominal technique was performed for global imaging of the pelvis including uterus, ovaries, adnexal regions, and pelvic cul-de-sac.  It was necessary to proceed  with endovaginal exam following the transabdominal exam to visualize the adnexa.  Comparison:  None  Findings:  Uterus: 8.5 x 4.1 x 5.5 cm.  No myometrial mass.  Retroverted.  Endometrium: 13 mm in thickness and uniform.  Right ovary:  1.8 x 1.4 x 0.9 cm complex cyst.  Otherwise within normal limits.  Left ovary: Normal appearance/no adnexal mass  Other findings: Small amount of free fluid is physiologic.  IMPRESSION: Complex cyst in the right ovary 1.8 cm likely a hemorrhagic cyst or involuting functional cyst.  Otherwise the study is within normal limits.   Original Report Authenticated By: Jolaine Click, M.D.    02/22/2012 GC, Chlam, wet prep all negative  Assessment & Plan:  Patient desires surgical evaluation for chronic pelvic pain.  Recommended operative laparoscopy and chromopertubation.  The risks of surgery were discussed in detail with the patient including but not limited to: bleeding which may require transfusion or reoperation; infection which may require prolonged hospitalization or re-hospitalization and antibiotic therapy; injury to bowel, bladder, ureters and major vessels or other surrounding organs; need for additional procedures including laparotomy; thromboembolic phenomenon, incisional problems and other postoperative or anesthesia complications.  Patient was told that the likelihood that her condition and symptoms will be treated effectively with this surgical management was very high; the postoperative expectations were also discussed in detail. The patient also understands the alternative treatment options which were discussed in full, she declined trial of OCPs.  All questions were answered.  She was told that she will be contacted by our surgical scheduler regarding the time and date of her surgery; routine preoperative instructions of having nothing to eat or drink after midnight on the day prior to  surgery and also coming to the hospital 1 1/2 hours prior to her time of surgery were  also emphasized.  She was told she may be called for a preoperative appointment about a week prior to surgery and will be given further preoperative instructions at that visit. Printed patient education handouts about the procedure were given to the patient to review at home.  Patient was given a prescription for Diclofenac for pain. Pain precautions given.

## 2012-03-07 ENCOUNTER — Encounter: Payer: Self-pay | Admitting: Obstetrics & Gynecology

## 2012-03-31 ENCOUNTER — Ambulatory Visit (INDEPENDENT_AMBULATORY_CARE_PROVIDER_SITE_OTHER): Payer: Self-pay | Admitting: Obstetrics & Gynecology

## 2012-03-31 ENCOUNTER — Encounter: Payer: Self-pay | Admitting: Obstetrics & Gynecology

## 2012-03-31 VITALS — BP 110/74 | HR 72 | Temp 98.1°F | Ht 61.0 in | Wt 116.3 lb

## 2012-03-31 DIAGNOSIS — N949 Unspecified condition associated with female genital organs and menstrual cycle: Secondary | ICD-10-CM

## 2012-03-31 DIAGNOSIS — G8929 Other chronic pain: Secondary | ICD-10-CM

## 2012-03-31 DIAGNOSIS — Z01818 Encounter for other preprocedural examination: Secondary | ICD-10-CM

## 2012-03-31 NOTE — Progress Notes (Signed)
History:   26 y.o. G3P3003 here today for preoperative examination for upcoming surgery; she is scheduled for operative laparoscopy, chromopertubation for chronic pelvic pain and infertility.   Patient also reports having infertility problems, she was told she needs a separate appointment to discuss any infertility issues.  The following portions of the patient's history were reviewed and updated as appropriate: allergies, current medications, past family history, past medical history, past social history, past surgical history and problem list.   Review of Systems:   Pertinent items are noted in HPI.   Objective:   Physical Exam  BP 110/74  Pulse 72  Temp 98.1 F (36.7 C) (Oral)  Ht 5' 1" (1.549 m)  Wt 116 lb 4.8 oz (52.753 kg)  BMI 21.97 kg/m2  LMP 03/08/2012 Gen: NAD  Abd: Soft, well-healed Pfannensteil incisions, moderate tenderness in RLQ around her right incisional edge, no masses palpated  Pelvic: Normal appearing external genitalia; normal appearing vaginal mucosa and cervix. Small uterus, normal adnexa. Diffuse uterine and adnexal tenderness on bimanual exam especially on the right.   Labs and Imaging  02/22/2012 TRANSABDOMINAL AND TRANSVAGINAL ULTRASOUND OF PELVIS Clinical Data: Pelvic pain Technique: Both transabdominal and transvaginal ultrasound examinations of the pelvis were performed. Transabdominal technique was performed for global imaging of the pelvis including uterus, ovaries, adnexal regions, and pelvic cul-de-sac. It was necessary to proceed with endovaginal exam following the transabdominal exam to visualize the adnexa. Comparison: None Findings: Uterus: 8.5 x 4.1 x 5.5 cm. No myometrial mass. Retroverted. Endometrium: 13 mm in thickness and uniform. Right ovary: 1.8 x 1.4 x 0.9 cm complex cyst. Otherwise within normal limits. Left ovary: Normal appearance/no adnexal mass Other findings: Small amount of free fluid is physiologic. IMPRESSION: Complex cyst in the right  ovary 1.8 cm likely a hemorrhagic cyst or involuting functional cyst. Otherwise the study is within normal limits.   02/22/2012 GC, Chlam, wet prep all negative   Assessment & Plan:   Patient will undergo operative laparoscopy and chromopertubation. The risks of surgery were re-discussed in detail with the patient including but not limited to: bleeding which may require transfusion or reoperation; infection which may require prolonged hospitalization or re-hospitalization and antibiotic therapy; injury to bowel, bladder, ureters and major vessels or other surrounding organs; need for additional procedures including laparotomy; thromboembolic phenomenon, incisional problems and other postoperative or anesthesia complications. Postoperative expectations were also discussed in detail.  All questions were answered.  Routine preoperative instructions of having nothing to eat or drink after midnight on the day prior to surgery and also coming to the hospital 1 1/2 hours prior to her time of surgery were also emphasized.  Patient has enough pain medications, will take them as needed.   

## 2012-03-31 NOTE — Patient Instructions (Signed)
Return to clinic for any scheduled appointments or for any gynecologic concerns as needed.   

## 2012-04-15 ENCOUNTER — Encounter (HOSPITAL_COMMUNITY): Payer: Self-pay | Admitting: Pharmacist

## 2012-04-18 ENCOUNTER — Encounter: Payer: Self-pay | Admitting: *Deleted

## 2012-04-18 NOTE — Progress Notes (Signed)
Pre-authorization obtained.  #[id number].

## 2012-04-20 ENCOUNTER — Encounter (HOSPITAL_COMMUNITY)
Admission: RE | Admit: 2012-04-20 | Discharge: 2012-04-20 | Disposition: A | Discharge status: Home / Self Care | Payer: PRIVATE HEALTH INSURANCE | Source: Ambulatory Visit | Attending: Obstetrics & Gynecology | Admitting: Obstetrics & Gynecology

## 2012-04-20 ENCOUNTER — Encounter (HOSPITAL_COMMUNITY): Payer: Self-pay

## 2012-04-20 ENCOUNTER — Encounter (HOSPITAL_COMMUNITY): Payer: Self-pay | Admitting: Obstetrics & Gynecology

## 2012-04-20 HISTORY — DX: Other specified postprocedural states: Z98.890

## 2012-04-20 HISTORY — DX: Nausea with vomiting, unspecified: R11.2

## 2012-04-20 LAB — CBC
MCH: 27.2 pg (ref 26.0–34.0)
MCV: 85.1 fL (ref 78.0–100.0)
Platelets: 289 10*3/uL (ref 150–400)
RBC: 4.23 MIL/uL (ref 3.87–5.11)
RDW: 13.3 % (ref 11.5–15.5)

## 2012-04-20 MED ORDER — DEXTROSE 5 % IV SOLN
2.0000 g | INTRAVENOUS | Status: AC
  Administered 2012-04-21: 2000 mg via INTRAVENOUS
  Filled 2012-04-20: qty 2

## 2012-04-20 MED ORDER — DEXTROSE 5 % IV SOLN
2.0000 g | INTRAVENOUS | Status: DC
  Filled 2012-04-20: qty 2

## 2012-04-20 NOTE — Patient Instructions (Signed)
Your procedure is scheduled on:04/21/12  Enter through the Main Entrance at :8am Pick up desk phone and dial [phone] and inform us of your arrival.  Please call [phone] if you have any problems the morning of surgery.  Remember: Do not eat or drink after midnight:tonight   DO NOT wear jewelry, eye make-up, lipstick,body lotion, or dark fingernail polish. Do not shave for 48 hours prior to surgery.   Patients discharged on the day of surgery will not be allowed to drive home.

## 2012-04-21 ENCOUNTER — Ambulatory Visit (HOSPITAL_COMMUNITY): Payer: PRIVATE HEALTH INSURANCE | Admitting: Anesthesiology

## 2012-04-21 ENCOUNTER — Ambulatory Visit (HOSPITAL_COMMUNITY)
Admission: RE | Admit: 2012-04-21 | Discharge: 2012-04-21 | Disposition: A | Discharge status: Home / Self Care | Payer: PRIVATE HEALTH INSURANCE | Source: Ambulatory Visit | Attending: Obstetrics & Gynecology | Admitting: Obstetrics & Gynecology

## 2012-04-21 ENCOUNTER — Encounter (HOSPITAL_COMMUNITY)
Admission: RE | Disposition: A | Discharge status: Home / Self Care | Payer: Self-pay | Source: Ambulatory Visit | Attending: Obstetrics & Gynecology

## 2012-04-21 ENCOUNTER — Encounter (HOSPITAL_COMMUNITY): Payer: Self-pay | Admitting: Anesthesiology

## 2012-04-21 DIAGNOSIS — R102 Pelvic and perineal pain: Secondary | ICD-10-CM

## 2012-04-21 DIAGNOSIS — Z01818 Encounter for other preprocedural examination: Secondary | ICD-10-CM | POA: Insufficient documentation

## 2012-04-21 DIAGNOSIS — N979 Female infertility, unspecified: Secondary | ICD-10-CM | POA: Diagnosis present

## 2012-04-21 DIAGNOSIS — IMO0002 Reserved for concepts with insufficient information to code with codable children: Secondary | ICD-10-CM

## 2012-04-21 DIAGNOSIS — N949 Unspecified condition associated with female genital organs and menstrual cycle: Secondary | ICD-10-CM | POA: Insufficient documentation

## 2012-04-21 DIAGNOSIS — G8929 Other chronic pain: Secondary | ICD-10-CM | POA: Diagnosis present

## 2012-04-21 DIAGNOSIS — N803 Endometriosis of pelvic peritoneum: Secondary | ICD-10-CM | POA: Insufficient documentation

## 2012-04-21 DIAGNOSIS — Z01812 Encounter for preprocedural laboratory examination: Secondary | ICD-10-CM | POA: Insufficient documentation

## 2012-04-21 HISTORY — PX: LAPAROSCOPY: SHX197

## 2012-04-21 HISTORY — PX: LAPAROSCOPIC LYSIS OF ADHESIONS: SHX5905

## 2012-04-21 HISTORY — DX: Endometriosis of pelvic peritoneum: N80.3

## 2012-04-21 HISTORY — DX: Reserved for concepts with insufficient information to code with codable children: IMO0002

## 2012-04-21 HISTORY — PX: CHROMOPERTUBATION: SHX6288

## 2012-04-21 SURGERY — LAPAROSCOPY OPERATIVE
Anesthesia: General | Site: Vagina | Wound class: Clean

## 2012-04-21 MED ORDER — MIDAZOLAM HCL 5 MG/5ML IJ SOLN
INTRAMUSCULAR | Status: DC | PRN
  Administered 2012-04-21: 2 mg via INTRAVENOUS

## 2012-04-21 MED ORDER — LIDOCAINE HCL (CARDIAC) 20 MG/ML IV SOLN
INTRAVENOUS | Status: AC
  Filled 2012-04-21: qty 5

## 2012-04-21 MED ORDER — NEOSTIGMINE METHYLSULFATE 1 MG/ML IJ SOLN
INTRAMUSCULAR | Status: AC
  Filled 2012-04-21: qty 1

## 2012-04-21 MED ORDER — OXYCODONE-ACETAMINOPHEN 5-325 MG PO TABS: 1.0000 | ORAL_TABLET | Freq: Four times a day (QID) | ORAL | Status: DC | PRN

## 2012-04-21 MED ORDER — METHYLENE BLUE 1 % INJ SOLN
INTRAMUSCULAR | Status: AC
  Filled 2012-04-21: qty 1

## 2012-04-21 MED ORDER — GLYCOPYRROLATE 0.2 MG/ML IJ SOLN
INTRAMUSCULAR | Status: DC | PRN
  Administered 2012-04-21: 0.4 mg via INTRAVENOUS

## 2012-04-21 MED ORDER — DEXAMETHASONE SODIUM PHOSPHATE 10 MG/ML IJ SOLN
INTRAMUSCULAR | Status: DC | PRN
  Administered 2012-04-21: 10 mg via INTRAVENOUS

## 2012-04-21 MED ORDER — KETOROLAC TROMETHAMINE 30 MG/ML IJ SOLN
INTRAMUSCULAR | Status: AC
  Filled 2012-04-21: qty 1

## 2012-04-21 MED ORDER — NEOSTIGMINE METHYLSULFATE 1 MG/ML IJ SOLN
INTRAMUSCULAR | Status: DC | PRN
  Administered 2012-04-21: 2 mg via INTRAVENOUS

## 2012-04-21 MED ORDER — IBUPROFEN 600 MG PO TABS: 600.0000 mg | ORAL_TABLET | Freq: Four times a day (QID) | ORAL | Status: DC | PRN

## 2012-04-21 MED ORDER — ONDANSETRON HCL 4 MG/2ML IJ SOLN
INTRAMUSCULAR | Status: DC | PRN
  Administered 2012-04-21: 4 mg via INTRAVENOUS

## 2012-04-21 MED ORDER — FENTANYL CITRATE 0.05 MG/ML IJ SOLN
INTRAMUSCULAR | Status: DC | PRN
  Administered 2012-04-21: 100 ug via INTRAVENOUS
  Administered 2012-04-21 (×3): 50 ug via INTRAVENOUS

## 2012-04-21 MED ORDER — ONDANSETRON HCL 4 MG/2ML IJ SOLN
INTRAMUSCULAR | Status: AC
  Filled 2012-04-21: qty 2

## 2012-04-21 MED ORDER — GLYCOPYRROLATE 0.2 MG/ML IJ SOLN
INTRAMUSCULAR | Status: AC
  Filled 2012-04-21: qty 2

## 2012-04-21 MED ORDER — KETOROLAC TROMETHAMINE 30 MG/ML IJ SOLN: 15.0000 mg | Freq: Once | INTRAMUSCULAR | Status: DC | PRN

## 2012-04-21 MED ORDER — MUPIROCIN 2 % EX OINT
TOPICAL_OINTMENT | CUTANEOUS | Status: AC
  Filled 2012-04-21: qty 22

## 2012-04-21 MED ORDER — ROCURONIUM BROMIDE 50 MG/5ML IV SOLN
INTRAVENOUS | Status: AC
  Filled 2012-04-21: qty 1

## 2012-04-21 MED ORDER — METHYLENE BLUE 1 % INJ SOLN
INTRAMUSCULAR | Status: DC | PRN
  Administered 2012-04-21: 1 mL

## 2012-04-21 MED ORDER — MUPIROCIN 2 % EX OINT
TOPICAL_OINTMENT | Freq: Two times a day (BID) | CUTANEOUS | Status: DC
  Administered 2012-04-21: 08:00:00 via NASAL

## 2012-04-21 MED ORDER — BUPIVACAINE HCL (PF) 0.5 % IJ SOLN
INTRAMUSCULAR | Status: DC | PRN
  Administered 2012-04-21: 8 mL

## 2012-04-21 MED ORDER — DEXAMETHASONE SODIUM PHOSPHATE 10 MG/ML IJ SOLN
INTRAMUSCULAR | Status: AC
  Filled 2012-04-21: qty 1

## 2012-04-21 MED ORDER — PROMETHAZINE HCL 25 MG/ML IJ SOLN: 6.2500 mg | INTRAMUSCULAR | Status: DC | PRN

## 2012-04-21 MED ORDER — BUPIVACAINE HCL (PF) 0.5 % IJ SOLN
INTRAMUSCULAR | Status: AC
  Filled 2012-04-21: qty 30

## 2012-04-21 MED ORDER — LACTATED RINGERS IR SOLN
Status: DC | PRN
  Administered 2012-04-21: 3000 mL

## 2012-04-21 MED ORDER — MIDAZOLAM HCL 2 MG/2ML IJ SOLN: 0.5000 mg | Freq: Once | INTRAMUSCULAR | Status: DC | PRN

## 2012-04-21 MED ORDER — DOCUSATE SODIUM 100 MG PO CAPS: 100.0000 mg | ORAL_CAPSULE | Freq: Two times a day (BID) | ORAL | Status: DC | PRN

## 2012-04-21 MED ORDER — MIDAZOLAM HCL 2 MG/2ML IJ SOLN
INTRAMUSCULAR | Status: AC
  Filled 2012-04-21: qty 2

## 2012-04-21 MED ORDER — MUPIROCIN 2 % EX OINT: TOPICAL_OINTMENT | Freq: Two times a day (BID) | CUTANEOUS | Status: DC

## 2012-04-21 MED ORDER — PROPOFOL 10 MG/ML IV EMUL
INTRAVENOUS | Status: AC
  Filled 2012-04-21: qty 20

## 2012-04-21 MED ORDER — PROPOFOL 10 MG/ML IV BOLUS
INTRAVENOUS | Status: DC | PRN
  Administered 2012-04-21: 150 mg via INTRAVENOUS

## 2012-04-21 MED ORDER — MEPERIDINE HCL 25 MG/ML IJ SOLN
INTRAMUSCULAR | Status: AC
  Administered 2012-04-21: 12.5 mg via INTRAVENOUS
  Filled 2012-04-21: qty 1

## 2012-04-21 MED ORDER — FENTANYL CITRATE 0.05 MG/ML IJ SOLN
INTRAMUSCULAR | Status: AC
  Filled 2012-04-21: qty 5

## 2012-04-21 MED ORDER — ROCURONIUM BROMIDE 100 MG/10ML IV SOLN
INTRAVENOUS | Status: DC | PRN
  Administered 2012-04-21: 30 mg via INTRAVENOUS

## 2012-04-21 MED ORDER — KETOROLAC TROMETHAMINE 30 MG/ML IJ SOLN
INTRAMUSCULAR | Status: DC | PRN
  Administered 2012-04-21: 30 mg via INTRAVENOUS

## 2012-04-21 MED ORDER — LACTATED RINGERS IV SOLN
INTRAVENOUS | Status: DC | PRN
  Administered 2012-04-21 (×2): via INTRAVENOUS

## 2012-04-21 MED ORDER — FENTANYL CITRATE 0.05 MG/ML IJ SOLN: 25.0000 ug | INTRAMUSCULAR | Status: DC | PRN

## 2012-04-21 MED ORDER — LACTATED RINGERS IV SOLN
INTRAVENOUS | Status: DC
  Administered 2012-04-21: 09:00:00 via INTRAVENOUS

## 2012-04-21 MED ORDER — MEPERIDINE HCL 25 MG/ML IJ SOLN
6.2500 mg | INTRAMUSCULAR | Status: DC | PRN
  Administered 2012-04-21: 12.5 mg via INTRAVENOUS

## 2012-04-21 MED ORDER — LIDOCAINE HCL (CARDIAC) 20 MG/ML IV SOLN
INTRAVENOUS | Status: DC | PRN
  Administered 2012-04-21: 50 mg via INTRAVENOUS

## 2012-04-21 SURGICAL SUPPLY — 30 items
BAG SPEC RTRVL LRG 6X4 10 (ENDOMECHANICALS)
CABLE HIGH FREQUENCY MONO STRZ (ELECTRODE) IMPLANT
CHLORAPREP W/TINT 26ML (MISCELLANEOUS) ×3 IMPLANT
CLOTH BEACON ORANGE TIMEOUT ST (SAFETY) ×3 IMPLANT
FORCEPS CUTTING 33CM 5MM (CUTTING FORCEPS) IMPLANT
GLOVE BIO SURGEON STRL SZ7 (GLOVE) ×3 IMPLANT
GLOVE BIOGEL PI IND STRL 7.0 (GLOVE) ×4 IMPLANT
GLOVE BIOGEL PI INDICATOR 7.0 (GLOVE) ×2
GOWN PREVENTION PLUS LG XLONG (DISPOSABLE) ×4 IMPLANT
GOWN STRL REIN XL XLG (GOWN DISPOSABLE) ×4 IMPLANT
NDL INSUFFLATION 14GA 120MM (NEEDLE) ×2 IMPLANT
NEEDLE GYRUS 33CM (NEEDLE) IMPLANT
NEEDLE INSUFFLATION 14GA 120MM (NEEDLE) ×3 IMPLANT
NS IRRIG 1000ML POUR BTL (IV SOLUTION) ×2 IMPLANT
PACK LAPAROSCOPY BASIN (CUSTOM PROCEDURE TRAY) ×3 IMPLANT
POUCH SPECIMEN RETRIEVAL 10MM (ENDOMECHANICALS) IMPLANT
PROTECTOR NERVE ULNAR (MISCELLANEOUS) ×4 IMPLANT
SEALER TISSUE G2 CVD JAW 35 (ENDOMECHANICALS) IMPLANT
SEALER TISSUE G2 CVD JAW 45CM (ENDOMECHANICALS)
SET IRRIG TUBING LAPAROSCOPIC (IRRIGATION / IRRIGATOR) ×1 IMPLANT
SUT VIC AB 3-0 X1 27 (SUTURE) IMPLANT
SUT VICRYL 0 UR6 27IN ABS (SUTURE) ×6 IMPLANT
SUT VICRYL 4-0 PS2 18IN ABS (SUTURE) ×3 IMPLANT
SYR 50ML LL SCALE MARK (SYRINGE) ×2 IMPLANT
TOWEL OR 17X24 6PK STRL BLUE (TOWEL DISPOSABLE) ×6 IMPLANT
TRAY FOLEY CATH 14FR (SET/KITS/TRAYS/PACK) ×3 IMPLANT
TROCAR XCEL NON-BLD 11X100MML (ENDOMECHANICALS) ×3 IMPLANT
TROCAR XCEL NON-BLD 5MMX100MML (ENDOMECHANICALS) ×6 IMPLANT
WARMER LAPAROSCOPE (MISCELLANEOUS) ×3 IMPLANT
WATER STERILE IRR 1000ML POUR (IV SOLUTION) ×2 IMPLANT

## 2012-04-21 NOTE — Transfer of Care (Signed)
Immediate Anesthesia Transfer of Care Note  Patient: Shelly [name]  Procedure(s) Performed: Procedure(s) (LRB) with comments: LAPAROSCOPY OPERATIVE (N/A) - Peritoneal biopsies CHROMOPERTUBATION (N/A) LAPAROSCOPIC LYSIS OF ADHESIONS (N/A)  Patient Location: PACU  Anesthesia Type:General  Level of Consciousness: sedated  Airway & Oxygen Therapy: Patient Spontanous Breathing and Patient connected to nasal cannula oxygen  Post-op Assessment: Report given to PACU RN and Post -op Vital signs reviewed and stable  Post vital signs: stable  Complications: No apparent anesthesia complications

## 2012-04-21 NOTE — Anesthesia Postprocedure Evaluation (Signed)
Anesthesia Post Note  Patient: Shelly [name]  Procedure(s) Performed: Procedure(s) (LRB): LAPAROSCOPY OPERATIVE (N/A) CHROMOPERTUBATION (N/A) LAPAROSCOPIC LYSIS OF ADHESIONS (N/A)  Anesthesia type: GA  Patient location: PACU  Post pain: Pain level controlled  Post assessment: Post-op Vital signs reviewed  Last Vitals:  Filed Vitals:   04/21/12 1045  BP: 111/74  Pulse: 62  Temp:   Resp: 14    Post vital signs: Reviewed  Level of consciousness: sedated  Complications: No apparent anesthesia complications

## 2012-04-21 NOTE — Op Note (Addendum)
Shelly [name] PROCEDURE DATE: 04/21/2012  PREOPERATIVE DIAGNOSIS: Chronic pelvic pain, secondary infertility POSTOPERATIVE DIAGNOSIS: The same PROCEDURE: Laparoscopy,peritoneal biopsies, lysis of adhesions, chromopertubation SURGEON:  Dr. Jaynie Collins ASSISTANT: Dr. Nicholaus Bloom  INDICATIONS: 27 y.o. [id number] with history of chronic pelvic pain and secondary infertility desiring surgical evaluation.   Please see preoperative notes for further details.  Risks of surgery were discussed with the patient including but not limited to: bleeding which may require transfusion or reoperation; infection which may require antibiotics; injury to bowel, bladder, ureters or other surrounding organs; need for additional procedures including laparotomy; thromboembolic phenomenon, incisional problems and other postoperative/anesthesia complications. Written informed consent was obtained.    FINDINGS:  Small uterus, normal appearance of ovaries and fallopian tubes bilaterally.  Chromopertubation revealed patency of the left fallopian tube but no patency on the right fallopian tube after after 180 ml instillation of diluted methylene blue solution. There is diffuse evidence of red-colored probable endometriosis lesions in the on the right pelvic side wall and posterior cul-de-sac, and diffuse thin adhesions which were lysed.  Peritoneal biopsies were taken and sent to pathology.  The posterior surface of her bladder was very adherent to her anterior lower segment which is expected given her history of three previous cesarean sections.  No other abdominal/pelvic abnormality.  Normal upper abdomen.  ANESTHESIA:    General INTRAVENOUS FLUIDS: 1100 ml ESTIMATED BLOOD LOSS: 10 ml URINE OUTPUT: 300 ml SPECIMENS: Peritoneal biopsies COMPLICATIONS: None immediate  PROCEDURE IN DETAIL:  The patient had sequential compression devices applied to her lower extremities while in the preoperative area.  She was then taken to the  operating room where general anesthesia was administered and was found to be adequate.  She was placed in the dorsal lithotomy position, and was prepped and draped in a sterile manner.  A Foley catheter was inserted into her bladder and attached to constant drainage and a Kahn uterine manipulator was then advanced into the uterus .  After an adequate timeout was performed, attention was then turned to the patient's abdomen where a 10-mm skin incision was made in the umbilical fold.  The Veress needle was carefully introduced into the peritoneal cavity at a 45-degree angle into the abdominal wall.  Intraperitoneal placement was confirmed by drop in intraabdominal pressure with insufflation of carbon dioxide gas.  Adequate pneumoperitoneum was obtained, and the 10-mm trocar and sleeve were then advanced without difficulty into the abdomen where intraabdominal placement was confirmed by the laparoscope. Bilateral 5-mm lower quadrant ports were placed under direct visualization.  A detailed survey of the patient's pelvis and abdomen revealed the findings as mentioned above.   Biopsy forceps were used to take peritoneal biopsies.  The thin adhesions were lysed bluntly.  The operative site was surveyed, and it was found to be hemostatic.  No intraoperative injury to surrounding organs was noted.  Chromopertubation was performed which revealed a patent left fallopian tube, but blocked right fallopian tube.  Irrigation of the pelvis was then performed. The abdomen was desufflated and all instruments were then removed from the patient's abdomen. The uterine manipulator was removed without complications.  All incisions were closed with 3-0 Vicryl subcuticular stitches and Dermabond. The patient tolerated the procedures well.  All instruments, needles, and sponge counts were correct x 2. The patient was taken to the recovery room in stable condition.   The patient will be discharged to home as per PACU criteria.  Routine  postoperative instructions given.  She was prescribed Percocet, Ibuprofen  and Colace.  She will follow up in the clinic on 05/12/12 for postoperative evaluation .

## 2012-04-21 NOTE — H&P (View-Only) (Signed)
History:   27 y.o. [id number] here today for preoperative examination for upcoming surgery; she is scheduled for operative laparoscopy, chromopertubation for chronic pelvic pain and infertility.   Patient also reports having infertility problems, she was told she needs a separate appointment to discuss any infertility issues.  The following portions of the patient's history were reviewed and updated as appropriate: allergies, current medications, past family history, past medical history, past social history, past surgical history and problem list.   Review of Systems:   Pertinent items are noted in HPI.   Objective:   Physical Exam  BP 110/74  Pulse 72  Temp 98.1 F (36.7 C) (Oral)  Ht 5\' 1"  (1.549 m)  Wt 116 lb 4.8 oz (52.753 kg)  BMI 21.97 kg/m2  LMP 03/08/2012 Gen: NAD  Abd: Soft, well-healed Pfannensteil incisions, moderate tenderness in RLQ around her right incisional edge, no masses palpated  Pelvic: Normal appearing external genitalia; normal appearing vaginal mucosa and cervix. Small uterus, normal adnexa. Diffuse uterine and adnexal tenderness on bimanual exam especially on the right.   Labs and Imaging  02/22/2012 TRANSABDOMINAL AND TRANSVAGINAL ULTRASOUND OF PELVIS Clinical Data: Pelvic pain Technique: Both transabdominal and transvaginal ultrasound examinations of the pelvis were performed. Transabdominal technique was performed for global imaging of the pelvis including uterus, ovaries, adnexal regions, and pelvic cul-de-sac. It was necessary to proceed with endovaginal exam following the transabdominal exam to visualize the adnexa. Comparison: None Findings: Uterus: 8.5 x 4.1 x 5.5 cm. No myometrial mass. Retroverted. Endometrium: 13 mm in thickness and uniform. Right ovary: 1.8 x 1.4 x 0.9 cm complex cyst. Otherwise within normal limits. Left ovary: Normal appearance/no adnexal mass Other findings: Small amount of free fluid is physiologic. IMPRESSION: Complex cyst in the right  ovary 1.8 cm likely a hemorrhagic cyst or involuting functional cyst. Otherwise the study is within normal limits.   02/22/2012 GC, Chlam, wet prep all negative   Assessment & Plan:   Patient will undergo operative laparoscopy and chromopertubation. The risks of surgery were re-discussed in detail with the patient including but not limited to: bleeding which may require transfusion or reoperation; infection which may require prolonged hospitalization or re-hospitalization and antibiotic therapy; injury to bowel, bladder, ureters and major vessels or other surrounding organs; need for additional procedures including laparotomy; thromboembolic phenomenon, incisional problems and other postoperative or anesthesia complications. Postoperative expectations were also discussed in detail.  All questions were answered.  Routine preoperative instructions of having nothing to eat or drink after midnight on the day prior to surgery and also coming to the hospital 1 1/2 hours prior to her time of surgery were also emphasized.  Patient has enough pain medications, will take them as needed.

## 2012-04-21 NOTE — Anesthesia Preprocedure Evaluation (Signed)
Anesthesia Evaluation  Patient identified by MRN, date of birth, ID band Patient awake    Reviewed: Allergy & Precautions, H&P , Patient's Chart, lab work & pertinent test results, reviewed documented beta blocker date and time   History of Anesthesia Complications (+) PONV  Airway Mallampati: II TM Distance: >3 FB Neck ROM: full    Dental No notable dental hx.    Pulmonary neg pulmonary ROS,  breath sounds clear to auscultation  Pulmonary exam normal       Cardiovascular Exercise Tolerance: Good negative cardio ROS  Rhythm:regular Rate:Normal     Neuro/Psych negative neurological ROS  negative psych ROS   GI/Hepatic negative GI ROS, Neg liver ROS,   Endo/Other  negative endocrine ROS  Renal/GU negative Renal ROS     Musculoskeletal   Abdominal   Peds  Hematology negative hematology ROS (+)   Anesthesia Other Findings   Reproductive/Obstetrics negative OB ROS                           Anesthesia Physical Anesthesia Plan  ASA: I  Anesthesia Plan: General ETT   Post-op Pain Management:    Induction:   Airway Management Planned:   Additional Equipment:   Intra-op Plan:   Post-operative Plan:   Informed Consent: I have reviewed the patients History and Physical, chart, labs and discussed the procedure including the risks, benefits and alternatives for the proposed anesthesia with the patient or authorized representative who has indicated his/her understanding and acceptance.   Dental Advisory Given  Plan Discussed with: CRNA and Surgeon  Anesthesia Plan Comments:         Anesthesia Quick Evaluation

## 2012-04-21 NOTE — Interval H&P Note (Signed)
History and Physical Interval Note 04/21/2012 8:24 AM  Shelly [name]  has presented today for surgery, with the diagnosis of chronic pelvic pain and secondary infertility  The various methods of treatment have been discussed with the patient and family. After consideration of risks, benefits and other options for treatment, the patient has consented to  Procedure(s): LAPAROSCOPY OPERATIVE (N/A) - chromopertubation/lysis of adhesion as a surgical intervention .  The patient's history has been reviewed, patient examined, no change in status, stable for surgery.  I have reviewed the patient's chart and labs.  Questions were answered to the patient's satisfaction.  To OR when ready.  Jaynie Collins, MD, FACOG Attending Obstetrician & Gynecologist Faculty Practice, [facility] [place]

## 2012-04-22 ENCOUNTER — Encounter (HOSPITAL_COMMUNITY): Payer: Self-pay | Admitting: Obstetrics & Gynecology

## 2012-04-22 ENCOUNTER — Encounter: Payer: Self-pay | Admitting: Obstetrics & Gynecology

## 2012-05-12 ENCOUNTER — Ambulatory Visit (INDEPENDENT_AMBULATORY_CARE_PROVIDER_SITE_OTHER): Payer: PRIVATE HEALTH INSURANCE | Admitting: Obstetrics & Gynecology

## 2012-05-12 ENCOUNTER — Encounter: Payer: Self-pay | Admitting: Obstetrics & Gynecology

## 2012-05-12 VITALS — BP 118/85 | HR 96 | Temp 99.8°F | Ht 61.0 in | Wt 119.6 lb

## 2012-05-12 DIAGNOSIS — N803 Endometriosis of pelvic peritoneum, unspecified: Secondary | ICD-10-CM

## 2012-05-12 DIAGNOSIS — G8929 Other chronic pain: Secondary | ICD-10-CM

## 2012-05-12 DIAGNOSIS — N949 Unspecified condition associated with female genital organs and menstrual cycle: Secondary | ICD-10-CM

## 2012-05-12 DIAGNOSIS — IMO0002 Reserved for concepts with insufficient information to code with codable children: Secondary | ICD-10-CM

## 2012-05-12 DIAGNOSIS — Z09 Encounter for follow-up examination after completed treatment for conditions other than malignant neoplasm: Secondary | ICD-10-CM

## 2012-05-12 DIAGNOSIS — N979 Female infertility, unspecified: Secondary | ICD-10-CM

## 2012-05-12 NOTE — Progress Notes (Signed)
Here for post op visit. States had diarrhea 1/26 and 1/27 and has had low fever up to 100.3 05/09/12, but states has felt hot today and yesterday but hasn't checked temperature.

## 2012-05-12 NOTE — Patient Instructions (Signed)
  Endometriosis  Endometriosis is a disease that occurs when the endometrium (lining of the uterus) is misplaced outside of its normal location. It may occur in many locations close to the uterus (womb), but commonly on the ovaries, fallopian tubes, vagina (birth canal) and bowel located close to the uterus. Because the uterus sloughs (expels) its lining every month (menses), there is bleeding whereever the endometrial tissue is located.  SYMPTOMS   Often there are no symptoms. However, because blood is irritating to tissues not normally exposed to it, when symptoms occur they vary with the location of the misplaced endometrium. Symptoms often include back and abdominal pain. Periods may be heavier and intercourse may be painful. Infertility may be present. You may have all of these symptoms at one time or another or you may have months with no symptoms at all. Although the symptoms occur mainly during menses, they can occur mid-cycle as well, and usually terminate with menopause.  DIAGNOSIS   Your caregiver may recommend a blood test and urine test (urinalysis) to help rule out other conditions. Another common test is ultrasound, a painless procedure that uses sound waves to make a sonogram "picture" of abnormal tissue that could be endometriosis. If your bowel movements are painful around your periods, your caregiver may advise a barium enema (an X-ray of the lower bowel), to try to find the source of your pain. This is sometimes confirmed by laparoscopy. Laparoscopy is a procedure where your caregiver looks into your abdomen with a laparoscope (a small pencil sized telescope). Your caregiver may take a tiny piece of tissue (biopsy) from any abnormal tissue to confirm or document your problem. These tissues are sent to the lab and a pathologist looks at them under the microscope to give a microscopic diagnosis.  TREATMENT   Once the diagnosis is made, it can be treated by destruction of the misplaced endometrial  tissue using heat (diathermy), laser, cutting (excision), or chemical means. It may also be treated with hormonal therapy. When using hormonal therapy menses are eliminated, therefore eliminating the monthly exposure to blood by the misplaced endometrial tissue. Only in severe cases is it necessary to perform a hysterectomy with removal of the tubes, uterus and ovaries.  HOME CARE INSTRUCTIONS    Only take over-the-counter or prescription medicines for pain, discomfort, or fever as directed by your caregiver.   Avoid activities that produce pain, including a physical sexual relationship.   Do not take aspirin as this may increase bleeding when not on hormonal therapy.   See your caregiver for pain or problems not controlled with treatment.  SEEK IMMEDIATE MEDICAL CARE IF:    Your pain is severe and is not responding to pain medicine.   You develop severe nausea and vomiting, or you cannot keep foods down.   Your pain localizes to the right lower part of your abdomen (possible appendicitis).   You have swelling or increasing pain in the abdomen.   You have a fever.   You see blood in your stool.  MAKE SURE YOU:    Understand these instructions.   Will watch your condition.   Will get help right away if you are not doing well or get worse.  Document Released: 03/27/2000 Document Revised: 06/22/2011 Document Reviewed: 11/16/2007  [employer] Patient Information 2013 [employer]

## 2012-05-12 NOTE — Progress Notes (Signed)
  Subjective:     Shelly [name] is a 27 y.o. [id number] female who presents to the clinic status post operative laparoscopy,peritoneal biopsies, lysis of adhesions, chromopertubation for chronic pelvic pain and infertility.  Eating a regular diet without difficulty. Reports having low grade fever yesterday with loose stool, possible gastroenteritis.  The patient is not having any pain.  The following portions of the patient's history were reviewed and updated as appropriate: allergies, current medications, past family history, past medical history, past social history, past surgical history and problem list.  Review of Systems Pertinent items are noted in HPI.    Objective:    BP 118/85  Pulse 96  Temp 99.8 F (37.7 C)  Ht 5\' 1"  (1.549 m)  Wt 119 lb 9.6 oz (54.25 kg)  BMI 22.60 kg/m2  LMP 04/09/2012 General:  alert and no distress  Abdomen: soft, bowel sounds active, non-tender  Incision:   healing well, no drainage, no erythema,  no dehiscence, incision well approximated     Pathology (04/21/2012) 1. Peritoneum, biopsy - FIBROTIC TISSUE. - NO EVIDENCE OF ATYPIA OR MALIGNANCY. 2. Cul-de-sac biopsy, left - FIBROTIC TISSUE WITH MESOTHELIAL LINED CYST AND FOCAL ENDOMETRIOSIS. - NO EVIDENCE OF ATYPIA OR MALIGNANC  Assessment:    Doing well postoperatively. Operative findings again reviewed. Pathology report discussed.  Chromopertubation showed nonpatent right fallopian tube.   Plan:   Discussed management options for endometriosis and infertility; all questions answered. Patient not interested in hormonal management or further surgery at this point as she wants to have another child.  She has been trying for over a year, has used ovulation predictor kits.  Recommended referall to Dr. April Manson ([facility]), patient agreed. Will follow up his recommendations.

## 2012-05-13 NOTE — Progress Notes (Unsigned)
Faxed via EPIC visit note and OP note from 05/13/11 from Dr. Macon Large for appointment scheduled with Dr. April Manson on Weds. March 19 at 8:00am.

## 2012-05-18 ENCOUNTER — Ambulatory Visit (INDEPENDENT_AMBULATORY_CARE_PROVIDER_SITE_OTHER): Payer: PRIVATE HEALTH INSURANCE | Admitting: Family Medicine

## 2012-05-18 VITALS — BP 134/86 | HR 62 | Temp 98.6°F | Resp 16 | Ht 62.0 in | Wt 118.0 lb

## 2012-05-18 DIAGNOSIS — R82998 Other abnormal findings in urine: Secondary | ICD-10-CM

## 2012-05-18 DIAGNOSIS — R8281 Pyuria: Secondary | ICD-10-CM

## 2012-05-18 DIAGNOSIS — R110 Nausea: Secondary | ICD-10-CM

## 2012-05-18 DIAGNOSIS — R109 Unspecified abdominal pain: Secondary | ICD-10-CM

## 2012-05-18 DIAGNOSIS — R51 Headache: Secondary | ICD-10-CM

## 2012-05-18 DIAGNOSIS — R509 Fever, unspecified: Secondary | ICD-10-CM

## 2012-05-18 DIAGNOSIS — R319 Hematuria, unspecified: Secondary | ICD-10-CM

## 2012-05-18 LAB — POCT CBC
Lymph, poc: 3.1 (ref 0.6–3.4)
MCH, POC: 27.2 pg (ref 27–31.2)
MCHC: 31.7 g/dL — AB (ref 31.8–35.4)
MID (cbc): 0.4 (ref 0–0.9)
MPV: 7.6 fL (ref 0–99.8)
POC LYMPH PERCENT: 44.7 %L (ref 10–50)
POC MID %: 5.4 %M (ref 0–12)
Platelet Count, POC: 350 10*3/uL (ref 142–424)
RBC: 4.27 M/uL (ref 4.04–5.48)
RDW, POC: 15.1 %
WBC: 7 10*3/uL (ref 4.6–10.2)

## 2012-05-18 LAB — POCT URINALYSIS DIPSTICK
Glucose, UA: NEGATIVE
Nitrite, UA: NEGATIVE
Protein, UA: NEGATIVE
Urobilinogen, UA: 0.2

## 2012-05-18 LAB — POCT UA - MICROSCOPIC ONLY
Casts, Ur, LPF, POC: NEGATIVE
Yeast, UA: NEGATIVE

## 2012-05-18 MED ORDER — ONDANSETRON 4 MG PO TBDP: 4.0000 mg | ORAL_TABLET | Freq: Three times a day (TID) | ORAL | Status: DC | PRN

## 2012-05-18 MED ORDER — TRAMADOL HCL 50 MG PO TABS: 50.0000 mg | ORAL_TABLET | Freq: Four times a day (QID) | ORAL | Status: DC | PRN

## 2012-05-18 NOTE — Progress Notes (Signed)
Subjective:    Patient ID: Shelly [name], female    DOB: [birth year], 27 y.o.   MRN: [id number]  HPI Shelly [name] is a 27 y.o. female  Started 2 days ago - persistent headache, weak and shaky feeling. Min cough yesterday.  No new urinary symptoms. Nausea but no vomiting, no back pain. Eyes feel like they are burning. Hot and cold spells over past week. No new abdominal pain, just nausea.  Some residual soreness at umbilical trocar site, but closed and no discharge from this site. Loose stool every other day. No blood in stool.  No recent foreign travel. Feels like moody recently. 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 No new home stressors, married with 3 boys.    Postop appointment last Thursday from laporoscopy for endometriosis on January 9th. Had 100.3 fever 1 week ago.  Temps up and down since last week.    No known sick contacts.    Review of Systems  Constitutional: Positive for fever and chills.  HENT: Negative for congestion, sore throat and neck stiffness.   Eyes: Negative for photophobia and visual disturbance.  Respiratory: Positive for cough (few times yesterday. ).   Cardiovascular: Negative for chest pain and leg swelling.  Gastrointestinal: Positive for nausea and diarrhea. Negative for vomiting, abdominal pain and constipation.  Genitourinary: Positive for vaginal bleeding (on and off since surgery. ). Negative for dysuria, urgency, frequency, hematuria, difficulty urinating and pelvic pain.  Skin: Negative for rash.  Neurological: Positive for headaches (bitemporal. ).       Objective:   Physical Exam  Vitals reviewed. Constitutional: She is oriented to person, place, and time. She appears well-developed and well-nourished. No distress.  Neck: Normal range of motion. Neck supple.  Cardiovascular: Normal rate, regular rhythm, normal heart sounds and intact distal pulses.  Exam reveals no friction rub.   No murmur heard. Pulmonary/Chest:  Effort normal and breath sounds normal. No respiratory distress. She has no wheezes. She has no rales.  Abdominal: Soft. Bowel sounds are normal. She exhibits no distension. There is no hepatosplenomegaly. There is tenderness (minimal epigastric ttp. ) in the epigastric area. There is no rigidity, no guarding, no CVA tenderness, no tenderness at McBurney's point and negative Murphy's sign. No hernia.    Neurological: She is alert and oriented to person, place, and time.  Skin: Skin is warm and dry. Rash noted.  Psychiatric: She has a normal mood and affect. Her behavior is normal.   Results for orders placed in visit on 05/18/12  POCT CBC      Component Value Range   WBC 7.0  4.6 - 10.2 K/uL   Lymph, poc 3.1  0.6 - 3.4   POC LYMPH PERCENT 44.7  10 - 50 %L   MID (cbc) 0.4  0 - 0.9   POC MID % 5.4  0 - 12 %M   POC Granulocyte 3.5  2 - 6.9   Granulocyte percent 49.9  37 - 80 %G   RBC 4.27  4.04 - 5.48 M/uL   Hemoglobin 11.6 (*) 12.2 - 16.2 g/dL   HCT, POC 84.6 (*) 96.2 - 47.9 %   MCV 85.8  80 - 97 fL   MCH, POC 27.2  27 - 31.2 pg   MCHC 31.7 (*) 31.8 - 35.4 g/dL   RDW, POC 95.2     Platelet Count, POC 350  142 - 424 K/uL   MPV 7.6  0 - 99.8 fL  POCT  UA - MICROSCOPIC ONLY      Component Value Range   WBC, Ur, HPF, POC 0-3     RBC, urine, microscopic 0-2     Bacteria, U Microscopic neg     Mucus, UA neg     Epithelial cells, urine per micros neg     Crystals, Ur, HPF, POC neg     Casts, Ur, LPF, POC neg     Yeast, UA neg    POCT URINALYSIS DIPSTICK      Component Value Range   Color, UA yellow     Clarity, UA clear     Glucose, UA neg     Bilirubin, UA neg     Ketones, UA neg     Spec Grav, UA 1.020     Blood, UA trace-intact     pH, UA 6.0     Protein, UA neg     Urobilinogen, UA 0.2     Nitrite, UA neg     Leukocytes, UA Negative    POCT URINE PREGNANCY      Component Value Range   Preg Test, Ur Negative         Assessment & Plan:  Shelly [name] is a 27 y.o.  female 1. Nausea  POCT CBC, POCT UA - Microscopic Only, POCT urinalysis dipstick, Lipase, ondansetron (ZOFRAN ODT) 4 MG disintegrating tablet  2. Headache  POCT CBC, POCT UA - Microscopic Only, POCT urinalysis dipstick, traMADol (ULTRAM) 50 MG tablet  3. Abdominal pain  Comprehensive metabolic panel, Lipase  4. Fever and chills  Comprehensive metabolic panel, Lipase  5. Hematuria  Urine culture  6. Pyuria  Urine culture   Possible viral illness or gastroenteritis with subjective f/c, intermittent diarrhea. Minimal epigastric abd pain, but otherwise reassuring CBC and exam.  Will check labs above, ultram if needed for HA temporarily, and Zofran for nausea.  Recheck in next few days if not improving, sooner if worse.    Patient Instructions  Your should receive a call or letter about your lab results within the next week to 10 days.  Ultram if needed for headache, zofran if needed for nausea.  Bland diet.  Recheck if not improving in the next 3-5 days. Return to the clinic or go to the nearest emergency room if any of your symptoms worsen or new symptoms occur.

## 2012-05-18 NOTE — Patient Instructions (Addendum)
Your should receive a call or letter about your lab results within the next week to 10 days.  Ultram if needed for headache, zofran if needed for nausea.  Bland diet.  Recheck if not improving in the next 3-5 days. Return to the clinic or go to the nearest emergency room if any of your symptoms worsen or new symptoms occur.

## 2012-05-19 ENCOUNTER — Encounter (HOSPITAL_COMMUNITY): Payer: Self-pay | Admitting: Emergency Medicine

## 2012-05-19 ENCOUNTER — Emergency Department (HOSPITAL_COMMUNITY)
Admission: EM | Admit: 2012-05-19 | Discharge: 2012-05-20 | Disposition: A | Discharge status: Home / Self Care | Payer: PRIVATE HEALTH INSURANCE | Attending: Emergency Medicine | Admitting: Emergency Medicine

## 2012-05-19 DIAGNOSIS — IMO0001 Reserved for inherently not codable concepts without codable children: Secondary | ICD-10-CM | POA: Insufficient documentation

## 2012-05-19 DIAGNOSIS — J3489 Other specified disorders of nose and nasal sinuses: Secondary | ICD-10-CM | POA: Insufficient documentation

## 2012-05-19 DIAGNOSIS — Z79899 Other long term (current) drug therapy: Secondary | ICD-10-CM | POA: Insufficient documentation

## 2012-05-19 DIAGNOSIS — R51 Headache: Secondary | ICD-10-CM | POA: Insufficient documentation

## 2012-05-19 DIAGNOSIS — Z8742 Personal history of other diseases of the female genital tract: Secondary | ICD-10-CM | POA: Insufficient documentation

## 2012-05-19 DIAGNOSIS — R519 Headache, unspecified: Secondary | ICD-10-CM

## 2012-05-19 DIAGNOSIS — Z3202 Encounter for pregnancy test, result negative: Secondary | ICD-10-CM | POA: Insufficient documentation

## 2012-05-19 LAB — COMPREHENSIVE METABOLIC PANEL
AST: 12 U/L (ref 0–37)
Albumin: 4.8 g/dL (ref 3.5–5.2)
Alkaline Phosphatase: 47 U/L (ref 39–117)
BUN: 15 mg/dL (ref 6–23)
Potassium: 4.1 mEq/L (ref 3.5–5.3)
Sodium: 142 mEq/L (ref 135–145)
Total Bilirubin: 0.4 mg/dL (ref 0.3–1.2)
Total Protein: 7.5 g/dL (ref 6.0–8.3)

## 2012-05-19 LAB — CBC
Hemoglobin: 12 g/dL (ref 12.0–15.0)
MCH: 27.6 pg (ref 26.0–34.0)
RBC: 4.34 MIL/uL (ref 3.87–5.11)
WBC: 7.8 10*3/uL (ref 4.0–10.5)

## 2012-05-19 LAB — BASIC METABOLIC PANEL
CO2: 27 mEq/L (ref 19–32)
Calcium: 9 mg/dL (ref 8.4–10.5)
Chloride: 100 mEq/L (ref 96–112)
Glucose, Bld: 99 mg/dL (ref 70–99)
Sodium: 139 mEq/L (ref 135–145)

## 2012-05-19 MED ORDER — DIPHENHYDRAMINE HCL 50 MG/ML IJ SOLN
25.0000 mg | Freq: Once | INTRAMUSCULAR | Status: AC
  Administered 2012-05-19: 25 mg via INTRAVENOUS
  Filled 2012-05-19: qty 1

## 2012-05-19 MED ORDER — SODIUM CHLORIDE 0.9 % IV BOLUS (SEPSIS)
1000.0000 mL | Freq: Once | INTRAVENOUS | Status: AC
  Administered 2012-05-19: 1000 mL via INTRAVENOUS

## 2012-05-19 MED ORDER — KETOROLAC TROMETHAMINE 30 MG/ML IJ SOLN
30.0000 mg | Freq: Once | INTRAMUSCULAR | Status: AC
  Administered 2012-05-19: 30 mg via INTRAVENOUS
  Filled 2012-05-19: qty 1

## 2012-05-19 MED ORDER — DEXAMETHASONE SODIUM PHOSPHATE 10 MG/ML IJ SOLN
10.0000 mg | Freq: Once | INTRAMUSCULAR | Status: AC
  Administered 2012-05-19: 10 mg via INTRAVENOUS
  Filled 2012-05-19: qty 1

## 2012-05-19 NOTE — ED Notes (Signed)
PA at bedside.

## 2012-05-19 NOTE — ED Notes (Signed)
Pt reports headache and jaw pain since Monday 8/10. stiff neck since last night. Pt saw pcp yesterday and blood work was drawn. Pt reports her hands are swollen.

## 2012-05-19 NOTE — ED Notes (Signed)
Pt reports that she saw PCP for same yesterday and was given Tramadol with no relief.

## 2012-05-20 MED ORDER — AZITHROMYCIN 250 MG PO TABS: 250.0000 mg | ORAL_TABLET | Freq: Every day | ORAL | Status: DC

## 2012-05-20 MED ORDER — ONDANSETRON HCL 4 MG PO TABS: 4.0000 mg | ORAL_TABLET | Freq: Four times a day (QID) | ORAL | Status: DC

## 2012-05-20 MED ORDER — AZITHROMYCIN 250 MG PO TABS
500.0000 mg | ORAL_TABLET | Freq: Once | ORAL | Status: AC
  Administered 2012-05-20: 500 mg via ORAL
  Filled 2012-05-20: qty 2

## 2012-05-20 MED ORDER — OXYCODONE-ACETAMINOPHEN 5-325 MG PO TABS: 1.0000 | ORAL_TABLET | Freq: Four times a day (QID) | ORAL | Status: DC | PRN

## 2012-05-20 MED ORDER — OXYCODONE-ACETAMINOPHEN 5-325 MG PO TABS
2.0000 | ORAL_TABLET | Freq: Once | ORAL | Status: AC
  Administered 2012-05-20: 2 via ORAL
  Filled 2012-05-20: qty 2

## 2012-05-20 NOTE — ED Provider Notes (Signed)
History     CSN: [id number]  Arrival date & time 05/19/12  1609   First MD Initiated Contact with Patient 05/19/12 2140      Chief Complaint  Patient presents with  . Headache    (Consider location/radiation/quality/duration/timing/severity/associated sxs/prior treatment) HPI  Pt to the ER with complaints of headache, jaw pain, sinus tenderness and maxillofacial tenderness since Monday. Pain is 8/10. She complains of her neck muscle feeling tight, but denies decreased ROM. She saw her PCP yesterday who drew some blood work and a urine pregnancy test which were all normal and she is not pregnant. She feels that both of her hands are swollen also. She deneis having a history of headaches but had "head pain" in 2012 in which they did an MRI of the head and it was normal.  Denies flu symptoms of fevers, weakness, chills, cough, sore throat, abdominal pain, N/V/D lethargy. No visual changes. Admits to some nasal congestion.  nad vss Past Medical History  Diagnosis Date  . PONV (postoperative nausea and vomiting)   . No pertinent past medical history   . Endometriosis of pelvis 04/21/2012    Diagnosed on pathology after operative laparoscopy on 04/21/2012     Past Surgical History  Procedure Date  . Cesarean section     x 3  . Laparoscopy 04/21/2012    Procedure: LAPAROSCOPY OPERATIVE;  Surgeon: Tereso Newcomer, MD;  Location: [facility];  Service: Gynecology;  Laterality: N/A;  Peritoneal biopsies  . Chromopertubation 04/21/2012    Procedure: CHROMOPERTUBATION;  Surgeon: Tereso Newcomer, MD;  Location: [facility];  Service: Gynecology;  Laterality: N/A;  . Laparoscopic lysis of adhesions 04/21/2012    Procedure: LAPAROSCOPIC LYSIS OF ADHESIONS;  Surgeon: Tereso Newcomer, MD;  Location: [facility];  Service: Gynecology;  Laterality: N/A;    No family history on file.  History  Substance Use Topics  . Smoking status: Never Smoker   . Smokeless tobacco: Never Used  . Alcohol Use: No    OB History     Grav Para Term Preterm Abortions TAB SAB Ect Mult Living   3 3 3  0 0 0 0 0 0 3      Review of Systems  Review of Systems  Gen: no weight loss, fevers, chills, night sweats  Eyes: no discharge or drainage, no occular pain or visual changes  Nose: no epistaxis + rhinorrhea  Mouth: no dental pain, no sore throat, +jaw pain Neck: no neck pain but feels that neck muscles are tight Lungs:No wheezing, coughing or hemoptysis CV: no chest pain, palpitations, dependent edema or orthopnea  Abd: no abdominal pain, nausea, vomiting  GU: no dysuria or gross hematuria  MSK:  No abnormalities  Neuro: + headache, no focal neurologic deficits  Skin: no abnormalities Psyche: negative.   Allergies  Review of patient's allergies indicates no known allergies.  Home Medications   Current Outpatient Rx  Name  Route  Sig  Dispense  Refill  . ONDANSETRON 4 MG PO TBDP   Oral   Take 4 mg by mouth every 8 (eight) hours as needed. Nausea         . TRAMADOL HCL 50 MG PO TABS   Oral   Take 1 tablet (50 mg total) by mouth every 6 (six) hours as needed for pain.   20 tablet   0   . AZITHROMYCIN 250 MG PO TABS   Oral   Take 1 tablet (250 mg total) by mouth daily. Take  first 2 tablets together, then 1 every day until finished.   4 tablet   0     Start tomorrow 2/8/ 2014   . ONDANSETRON HCL 4 MG PO TABS   Oral   Take 1 tablet (4 mg total) by mouth every 6 (six) hours.   12 tablet   0   . OXYCODONE-ACETAMINOPHEN 5-325 MG PO TABS   Oral   Take 1 tablet by mouth every 6 (six) hours as needed for pain.   8 tablet   0     BP 100/62  Pulse 67  Temp 98.5 F (36.9 C) (Oral)  Resp 18  SpO2 100%  LMP 05/14/2012  Physical Exam  Nursing note and vitals reviewed. Constitutional: She appears well-developed and well-nourished. She appears lethargic. No distress.  HENT:  Head: Normocephalic and atraumatic.  Right Ear: External ear normal.  Left Ear: External ear normal.  Nose:  Rhinorrhea present.  Mouth/Throat: No oropharyngeal exudate.  Eyes: Pupils are equal, round, and reactive to light.  Neck: Trachea normal and normal range of motion. Neck supple. Muscular tenderness present. No spinous process tenderness present. No rigidity. Normal range of motion present. No Brudzinski's sign and no Kernig's sign noted.  Cardiovascular: Normal rate and regular rhythm.   Pulmonary/Chest: Effort normal.  Abdominal: Soft.  Musculoskeletal: Normal range of motion.  Neurological: She has normal strength. She appears lethargic. No cranial nerve deficit or sensory deficit. She exhibits normal muscle tone.  Skin: Skin is warm and dry.    ED Course  Procedures (including critical care time)   Labs Reviewed  CBC  BASIC METABOLIC PANEL   No results found.   1. Sinus headache       MDM  Meningitis considered. Does not have fever, lethargy, pt has full ROM of neck with some muscular tenderness and headache has persisted for 4 days now.   She is having nasal congestion and sinus tenderness. Will tx with abx and give pain medication.  In ED she received 1L NS, decadron, toradol and Benadryl and her headache went from an 8/10 to a 2/10.  Urine pregnancy done yesterday and negative.  She can follow-up with PCP.  Pt has been advised of the symptoms that warrant their return to the ED. Patient has voiced understanding and has agreed to follow-up with the PCP or specialist.         Dorthula Matas, PA 05/20/12 0011  Dorthula Matas, PA 05/20/12 4540

## 2012-05-21 LAB — URINE CULTURE: Organism ID, Bacteria: NO GROWTH

## 2012-05-21 NOTE — ED Provider Notes (Signed)
Medical screening examination/treatment/procedure(s) were performed by non-physician practitioner and as supervising physician I was immediately available for consultation/collaboration.   Laray Anger, DO 05/21/12 1715

## 2012-08-30 ENCOUNTER — Telehealth: Payer: Self-pay | Admitting: *Deleted

## 2012-08-30 NOTE — Telephone Encounter (Signed)
Pt left message stating that she is still waiting for a form to be signed by Dr. Macon Large stating that she did not treat her for pre-existing medical problem.  It has been 6 mos and the claim is about to be dropped. The form can be re-faxed if necessary. I called pt back and explained that we did not get a form previously. She said she asked the insurance co to re-fax it today. I said that I will check on it.

## 2012-09-06 NOTE — Telephone Encounter (Signed)
Patient left a message inquiring if we had received the form yet? Would like a call back to let her know if we have it?

## 2012-09-07 NOTE — Telephone Encounter (Signed)
Form still has not been received yet- needs to be ATTN: Shelly [name] or someone specifically in this office. Called patient to inform her we do not have it, no answer- left message to call us back at the clinics.

## 2012-09-12 ENCOUNTER — Encounter: Payer: Self-pay | Admitting: General Practice

## 2012-09-12 NOTE — Progress Notes (Signed)
Faxed records from patient's visits in clinic and surgical to her health insurance company per her request

## 2012-09-13 NOTE — Telephone Encounter (Signed)
Patient came by on 6/2 and signed a release of information to her insurance company and stated she needed current health records to be sent in but did not fax number at the time and stated she would call us later with the number. Patient called and left message with the fax number to send current records and 6 months of records prior to her November visit here. Faxed in current records from [place] since her November visit to her insurance company. Called patient and informed her that I faxed her records from [place] but that she needs to contact who ever she was seeing before [place] for the 6 months prior records because we do not have those nor are authorized to do so. Patient verbalized understanding and had no further questions.

## 2012-10-10 ENCOUNTER — Ambulatory Visit (INDEPENDENT_AMBULATORY_CARE_PROVIDER_SITE_OTHER): Payer: PRIVATE HEALTH INSURANCE | Admitting: Family Medicine

## 2012-10-10 VITALS — BP 128/80 | HR 98 | Temp 98.7°F | Resp 16 | Ht 61.0 in | Wt 120.0 lb

## 2012-10-10 DIAGNOSIS — D509 Iron deficiency anemia, unspecified: Secondary | ICD-10-CM

## 2012-10-10 DIAGNOSIS — G4700 Insomnia, unspecified: Secondary | ICD-10-CM

## 2012-10-10 DIAGNOSIS — K2970 Gastritis, unspecified, without bleeding: Secondary | ICD-10-CM

## 2012-10-10 MED ORDER — ESOMEPRAZOLE MAGNESIUM 40 MG PO CPDR: 40.0000 mg | DELAYED_RELEASE_CAPSULE | Freq: Every day | ORAL | Status: DC

## 2012-10-10 MED ORDER — SERTRALINE HCL 50 MG PO TABS: 50.0000 mg | ORAL_TABLET | Freq: Every day | ORAL | Status: DC

## 2012-10-10 NOTE — Progress Notes (Signed)
Is a 27 year old married woman with a family of 3 boys age 64, 76, and for is going to nursing school. She's been having trouble in the pit of her stomach for a year. She's been tested repeatedly with H. pylori, CBC, and celiac disease tests.  She says that she normally gets 10 hours of sleep a night to feel good and lately she's been getting as little as one hour. She has thought perseveration and lots of anxiety. She feels like she is falling apart.  Objective: Patient is alert, cooperative and articulate. She does however break down and cry she describes her symptoms.  HEENT: Unremarkable Chest: Clear Abdomen: Soft nontender without HSM or masses. She indicates that the pain and discomfort of bloating have been between her umbilicus and her xiphoid. Skin: Unremarkable Extremities: Normal  Assessment: Extreme anxiety with gastritis. He also has a mild iron deficiency anemia that's been showing up over the last year.  Plan:Unspecified gastritis and gastroduodenitis without mention of hemorrhage - Plan: esomeprazole (NEXIUM) 40 MG capsule  Insomnia - Plan: sertraline (ZOLOFT) 50 MG tablet Start iron replacement in several days after we are sure the medicines are tolerated and working well. Signed, Elvina Sidle, MD

## 2012-10-10 NOTE — Patient Instructions (Addendum)

## 2013-01-12 ENCOUNTER — Encounter: Payer: Self-pay | Admitting: Internal Medicine

## 2013-02-15 ENCOUNTER — Ambulatory Visit (INDEPENDENT_AMBULATORY_CARE_PROVIDER_SITE_OTHER): Payer: 59 | Admitting: Internal Medicine

## 2013-02-15 ENCOUNTER — Other Ambulatory Visit (INDEPENDENT_AMBULATORY_CARE_PROVIDER_SITE_OTHER): Payer: 59

## 2013-02-15 ENCOUNTER — Encounter: Payer: Self-pay | Admitting: Internal Medicine

## 2013-02-15 VITALS — BP 94/60 | HR 88 | Ht 60.5 in | Wt 125.4 lb

## 2013-02-15 DIAGNOSIS — R198 Other specified symptoms and signs involving the digestive system and abdomen: Secondary | ICD-10-CM

## 2013-02-15 DIAGNOSIS — R194 Change in bowel habit: Secondary | ICD-10-CM | POA: Insufficient documentation

## 2013-02-15 DIAGNOSIS — R1013 Epigastric pain: Secondary | ICD-10-CM

## 2013-02-15 DIAGNOSIS — R141 Gas pain: Secondary | ICD-10-CM

## 2013-02-15 DIAGNOSIS — D509 Iron deficiency anemia, unspecified: Secondary | ICD-10-CM

## 2013-02-15 DIAGNOSIS — R140 Abdominal distension (gaseous): Secondary | ICD-10-CM

## 2013-02-15 DIAGNOSIS — G8929 Other chronic pain: Secondary | ICD-10-CM

## 2013-02-15 LAB — CBC WITH DIFFERENTIAL/PLATELET
Basophils Relative: 0.6 % (ref 0.0–3.0)
Eosinophils Absolute: 0.1 10*3/uL (ref 0.0–0.7)
Eosinophils Relative: 0.9 % (ref 0.0–5.0)
HCT: 35.5 % — ABNORMAL LOW (ref 36.0–46.0)
Hemoglobin: 12 g/dL (ref 12.0–15.0)
Lymphs Abs: 1.8 10*3/uL (ref 0.7–4.0)
MCHC: 33.8 g/dL (ref 30.0–36.0)
MCV: 79.8 fl (ref 78.0–100.0)
Monocytes Absolute: 0.6 10*3/uL (ref 0.1–1.0)
Neutro Abs: 6.3 10*3/uL (ref 1.4–7.7)
Neutrophils Relative %: 71.5 % (ref 43.0–77.0)
RBC: 4.45 Mil/uL (ref 3.87–5.11)
WBC: 8.8 10*3/uL (ref 4.5–10.5)

## 2013-02-15 MED ORDER — NA SULFATE-K SULFATE-MG SULF 17.5-3.13-1.6 GM/177ML PO SOLN: ORAL | Status: DC

## 2013-02-15 MED ORDER — DICYCLOMINE HCL 20 MG PO TABS: 20.0000 mg | ORAL_TABLET | Freq: Three times a day (TID) | ORAL | Status: DC

## 2013-02-15 MED ORDER — BENEFIBER PO POWD: ORAL | Status: DC

## 2013-02-15 NOTE — Patient Instructions (Addendum)
Your physician has requested that you go to the basement for  lab work before leaving today.  Hold your iron medicine until we contact you with lab results and plans.  You have been scheduled for an endoscopy and colonoscopy with propofol. Please follow the written instructions given to you at your visit today. Please pick up your prep at the pharmacy within the next 1-3 days. If you use inhalers (even only as needed), please bring them with you on the day of your procedure. Your physician has requested that you go to www.startemmi.com and enter the access code given to you at your visit today. This web site gives a general overview about your procedure. However, you should still follow specific instructions given to you by our office regarding your preparation for the procedure.  We have sent the following medications to your pharmacy for you to pick up at your convenience: Generic Bentyl  Today we are providing you with a Benefiber handout to read and follow, start with one tablespoon daily.  I appreciate the opportunity to care for you.

## 2013-02-15 NOTE — Progress Notes (Addendum)
Subjective:    Patient ID: Shelly [name], female    DOB: 25-Feb-1986, 27 y.o.   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 She describes intense abdominal distention and bloating with soreness and discomfort in the abdomen after eating but can also occur without that she has noticed that it will occur when she awakens in the morning. She has been tested for celiac disease with a negative TTG antibody but has not had an IgA level. She is negative for H. pylori. She has alternating bowel habits, hard and loose with some crampy abdominal pain. She denies rectal bleeding but she's had a chronic anemia. She's had a diagnosis of endometriosis made by laparoscopy and says that the pain associated with that is gone after the laparoscopic. Periods are 6 days, though mostly regular they don't occur in exact cycle there is slight irregularity. She is not using birth control this time.  This summer she was treated with Nexium and also generic Zoloft because she was under a great deal of stress. She took both of those for a month and during that month she felt better. She attributed the improvement of the Nexium  Wt Readings from Last 3 Encounters:  02/15/13 125 lb 6 oz (56.87 kg)  10/10/12 120 lb (54.432 kg)  05/18/12 118 lb (53.524 kg)   No Known Allergies Outpatient Prescriptions Prior to Visit  Medication Sig Dispense Refill  . esomeprazole (NEXIUM) 40 MG capsule Take 1 capsule (40 mg total) by mouth daily.  30 capsule  3  . sertraline (ZOLOFT) 50 MG tablet Take 1 tablet (50 mg total) by mouth daily.  30 tablet  3   No facility-administered medications prior to visit.   Past Medical History  Diagnosis Date  . PONV (postoperative nausea and vomiting)   . Endometriosis of pelvis 04/21/2012    Diagnosed on pathology after operative laparoscopy on 04/21/2012   . Anxiety    Past Surgical  History  Procedure Laterality Date  . Cesarean section  09/17/00, 08/31/02, 06/14/08    x 3  . Laparoscopy  04/21/2012    Procedure: LAPAROSCOPY OPERATIVE;  Surgeon: Tereso Newcomer, MD;  Location: [facility];  Service: Gynecology;  Laterality: N/A;  Peritoneal biopsies  . Chromopertubation  04/21/2012    Procedure: CHROMOPERTUBATION;  Surgeon: Tereso Newcomer, MD;  Location: [facility];  Service: Gynecology;  Laterality: N/A;  . Laparoscopic lysis of adhesions  04/21/2012    Procedure: LAPAROSCOPIC LYSIS OF ADHESIONS;  Surgeon: Tereso Newcomer, MD;  Location: [facility];  Service: Gynecology;  Laterality: N/A;   History   Social History  . Marital Status: Married    Spouse Name: N/A    Number of Children: 3  . Years of Education: Nursing student   Occupational History  . student    Social History Main Topics  . Smoking status: Never Smoker   . Smokeless tobacco: Never Used  . Alcohol Use: No  . Drug Use: No  . Sexual Activity: Yes    Birth Control/ Protection: None     Family History  Problem Relation Age of Onset  . Breast cancer Mother   . Breast cancer Paternal Aunt   . Colon cancer Other     greatgrandfather and great uncle          Review of Systems This is positive for those things mentiones  in the HPI. All other review of systems are negative.      Objective:   Physical Exam General:  Well-developed, well-nourished and in no acute distress Eyes:  anicteric. ENT:   Mouth and posterior pharynx free of lesions.  Neck:   supple w/o thyromegaly or mass.  Lungs: Clear to auscultation bilaterally. Heart:  S1S2, no rubs, murmurs, gallops. Abdomen:  soft, non-tender, no hepatosplenomegaly, hernia, or mass and BS+.  Rectal: deferred Lymph:  no cervical or supraclavicular adenopathy. Extremities:   no edema Skin   no rash.+ tattoos Neuro:  A&O x 3.  Psych:  appropriate mood and  Affect.   Data Reviewed: 2012 CT PCP notes Labs GYN       Assessment & Plan:   Abdominal pain, chronic, epigastric - Plan: Ambulatory referral to Gastroenterology, CBC with Differential, Ferritin, TSH, IgA, Vitamin B12  Change in bowel habits - Plan: Ambulatory referral to Gastroenterology  Bloating  Anemia, iron deficiency-suspected - Plan: Ambulatory referral to Gastroenterology, CBC with Differential, Ferritin, IgA, Vitamin B12

## 2013-02-15 NOTE — Assessment & Plan Note (Signed)
EGD - ? Functional vs inflammatory She was better on Nexium and Zoloft

## 2013-02-15 NOTE — Assessment & Plan Note (Signed)
Alternating bowel habits IBS vs. IBD colonoscopy

## 2013-02-15 NOTE — Assessment & Plan Note (Signed)
Recheck CBC, ferritin and B12, IgA Colonoscopy/EGD

## 2013-02-20 ENCOUNTER — Encounter: Payer: Self-pay | Admitting: Internal Medicine

## 2013-02-20 NOTE — Progress Notes (Signed)
Quick Note:  Informed by My Chart To start ferrous sulfate 325 mg qd ______

## 2013-03-14 ENCOUNTER — Encounter: Payer: Self-pay | Admitting: Internal Medicine

## 2013-03-27 ENCOUNTER — Encounter: Payer: Self-pay | Admitting: Internal Medicine

## 2013-03-27 ENCOUNTER — Ambulatory Visit (AMBULATORY_SURGERY_CENTER): Payer: 59 | Admitting: Internal Medicine

## 2013-03-27 VITALS — BP 106/72 | HR 79 | Temp 98.8°F | Resp 37 | Ht 65.0 in | Wt 125.0 lb

## 2013-03-27 DIAGNOSIS — R194 Change in bowel habit: Secondary | ICD-10-CM

## 2013-03-27 DIAGNOSIS — D509 Iron deficiency anemia, unspecified: Secondary | ICD-10-CM

## 2013-03-27 DIAGNOSIS — R1013 Epigastric pain: Secondary | ICD-10-CM

## 2013-03-27 DIAGNOSIS — R198 Other specified symptoms and signs involving the digestive system and abdomen: Secondary | ICD-10-CM

## 2013-03-27 MED ORDER — SODIUM CHLORIDE 0.9 % IV SOLN: 500.0000 mL | INTRAVENOUS | Status: DC

## 2013-03-27 NOTE — Op Note (Addendum)
[facility] [address] [place], [address]   COLONOSCOPY PROCEDURE REPORT  PATIENT: [name], Shelly  MR#: [id number] BIRTHDATE: 08-20-1985 , 27  yrs. old GENDER: Female ENDOSCOPIST: Iva Boop, MD, [facility] PROCEDURE DATE:  03/27/2013 PROCEDURE:   Colonoscopy, diagnostic First Screening Colonoscopy - Avg.  risk and is 50 yrs.  old or older - No.  Prior Negative Screening - Now for repeat screening. N/A  History of Adenoma - Now for follow-up colonoscopy & has been > or = to 3 yrs.  N/A  Polyps Removed Today? No.  Recommend repeat exam, <10 yrs? No. ASA CLASS:   Class I INDICATIONS:Change in bowel habits and Iron Deficiency Anemia. MEDICATIONS: There was residual sedation effect present from prior procedure and Propofol (Diprivan) 180 mg IV  DESCRIPTION OF PROCEDURE:   After the risks benefits and alternatives of the procedure were thoroughly explained, informed consent was obtained.  A digital rectal exam revealed no abnormalities of the rectum.   The LB [id number]  endoscope was introduced through the anus and advanced to the terminal ileum which was intubated for a short distance. No adverse events experienced.   The quality of the prep was excellent using Suprep The instrument was then slowly withdrawn as the colon was fully examined.      COLON FINDINGS: A normal appearing cecum, ileocecal valve, and appendiceal orifice were identified.  The ascending, hepatic flexure, transverse, splenic flexure, descending, sigmoid colon and rectum appeared unremarkable.  No polyps or cancers were seen. The mucosa appeared normal in the terminal ileum.  Retroflexed views revealed an anal fissure. The time to cecum=3 minutes 36 seconds.  Withdrawal time=7 minutes 32 seconds.  The scope was withdrawn and the procedure completed. COMPLICATIONS: There were no complications.  ENDOSCOPIC IMPRESSION: 1.   Normal colon 2.   Normal mucosa in the terminal  ileum  RECOMMENDATIONS: Low FODMAPS diet See Dr.  Leone Payor in 6-8 weeks   eSigned:  Iva Boop, MD, [facility] 03/27/2013 3:20 PM Revised: 03/27/2013 3:20 PM  cc: The Patient

## 2013-03-27 NOTE — Progress Notes (Signed)
Patient did not experience any of the following events: a burn prior to discharge; a fall within the facility; wrong site/side/patient/procedure/implant event; or a hospital transfer or hospital admission upon discharge from the facility. (G8907) Patient did not have preoperative order for IV antibiotic SSI prophylaxis. (G8918)  

## 2013-03-27 NOTE — Progress Notes (Signed)
Report to pacu rn, vss, bbs=clear 

## 2013-03-27 NOTE — Progress Notes (Signed)
Informed Dr. Leone Payor of patient's body piercing that can not be removed.

## 2013-03-27 NOTE — Op Note (Signed)
[facility] [address] [place], [address]   ENDOSCOPY PROCEDURE REPORT  PATIENT: Dasie, Chancellor  MR#: [id number] BIRTHDATE: 06/06/1985 , 27  yrs. old GENDER: Female ENDOSCOPIST: Iva Boop, MD, [facility] PROCEDURE DATE:  03/27/2013 PROCEDURE:  EGD, diagnostic ASA CLASS:     Class I INDICATIONS:  Epigastric pain.   Iron deficiency anemia. MEDICATIONS: propofol (Diprivan) 250mg  IV, MAC sedation, administered by CRNA, and These medications were titrated to patient response per physician's verbal order TOPICAL ANESTHETIC: Cetacaine Spray  DESCRIPTION OF PROCEDURE: After the risks benefits and alternatives of the procedure were thoroughly explained, informed consent was obtained.  The LB [id number] endoscope was introduced through the mouth and advanced to the second portion of the duodenum. Without limitations.  The instrument was slowly withdrawn as the mucosa was fully examined.      The upper, middle and distal third of the esophagus were carefully inspected and no abnormalities were noted.  The z-line was well seen at the GEJ.  The endoscope was pushed into the fundus which was normal including a retroflexed view.  The antrum, gastric body, first and second part of the duodenum were unremarkable. Retroflexed views revealed no abnormalities.     The scope was then withdrawn from the patient and the procedure completed.  COMPLICATIONS: There were no complications. ENDOSCOPIC IMPRESSION: Normal EGD  RECOMMENDATIONS: Proceed with a Colonoscopy.   eSigned:  Iva Boop, MD, [facility] 03/27/2013 3:08 PM   CC:The Patient

## 2013-03-27 NOTE — Patient Instructions (Addendum)
The esophagus, stomach, duodenum, colon and terminal ileum all look normal. I do not recommend any other testing at this time. I would like you to avoid FODMAPS (see diet instruction) to see if that helps you feel better.  Please call soon and make an appointment to see me in 6-6 weeks (Jan/Feb).  I appreciate the opportunity to care for you. Iva Boop, MD, FACG   YOU HAD AN ENDOSCOPIC PROCEDURE TODAY AT THE [facility]: Refer to the procedure report that was given to you for any specific questions about what was found during the examination.  If the procedure report does not answer your questions, please call your gastroenterologist to clarify.  If you requested that your care partner not be given the details of your procedure findings, then the procedure report has been included in a sealed envelope for you to review at your convenience later.  YOU SHOULD EXPECT: Some feelings of bloating in the abdomen. Passage of more gas than usual.  Walking can help get rid of the air that was put into your GI tract during the procedure and reduce the bloating. If you had a lower endoscopy (such as a colonoscopy or flexible sigmoidoscopy) you may notice spotting of blood in your stool or on the toilet paper. If you underwent a bowel prep for your procedure, then you may not have a normal bowel movement for a few days.  DIET: Your first meal following the procedure should be a light meal and then it is ok to progress to your normal diet.  A half-sandwich or bowl of soup is an example of a good first meal.  Heavy or fried foods are harder to digest and may make you feel nauseous or bloated.  Likewise meals heavy in dairy and vegetables can cause extra gas to form and this can also increase the bloating.  Drink plenty of fluids but you should avoid alcoholic beverages for 24 hours.  ACTIVITY: Your care partner should take you home directly after the procedure.  You should plan to take it easy,  moving slowly for the rest of the day.  You can resume normal activity the day after the procedure however you should NOT DRIVE or use heavy machinery for 24 hours (because of the sedation medicines used during the test).    SYMPTOMS TO REPORT IMMEDIATELY: A gastroenterologist can be reached at any hour.  During normal business hours, 8:30 AM to 5:00 PM Monday through Friday, call [phone].  After hours and on weekends, please call the GI answering service at [phone] who will take a message and have the physician on call contact you.   Following lower endoscopy (colonoscopy or flexible sigmoidoscopy):  Excessive amounts of blood in the stool  Significant tenderness or worsening of abdominal pains  Swelling of the abdomen that is new, acute  Fever of 100F or higher  Following upper endoscopy (EGD)  Vomiting of blood or coffee ground material  New chest pain or pain under the shoulder blades  Painful or persistently difficult swallowing  New shortness of breath  Fever of 100F or higher  Black, tarry-looking stools  FOLLOW UP: If any biopsies were taken you will be contacted by phone or by letter within the next 1-3 weeks.  Call your gastroenterologist if you have not heard about the biopsies in 3 weeks.  Our staff will call the home number listed on your records the next business day following your procedure to check on you and  address any questions or concerns that you may have at that time regarding the information given to you following your procedure. This is a courtesy call and so if there is no answer at the home number and we have not heard from you through the emergency physician on call, we will assume that you have returned to your regular daily activities without incident.  SIGNATURES/CONFIDENTIALITY: You and/or your care partner have signed paperwork which will be entered into your electronic medical record.  These signatures attest to the fact that that the  information above on your After Visit Summary has been reviewed and is understood.  Full responsibility of the confidentiality of this discharge information lies with you and/or your care-partner.

## 2013-03-28 ENCOUNTER — Telehealth: Payer: Self-pay | Admitting: *Deleted

## 2013-03-28 NOTE — Telephone Encounter (Signed)
  Follow up Call-  Call back number 03/27/2013  Post procedure Call Back phone  # [phone]  Permission to leave phone message Yes     Patient questions:  Do you have a fever, pain , or abdominal swelling? no Pain Score  0 *  Have you tolerated food without any problems? yes  Have you been able to return to your normal activities? yes  Do you have any questions about your discharge instructions: Diet   no Medications  no Follow up visit  no  Do you have questions or concerns about your Care? no  Actions: * If pain score is 4 or above: No action needed, pain <4.

## 2013-05-21 ENCOUNTER — Ambulatory Visit (INDEPENDENT_AMBULATORY_CARE_PROVIDER_SITE_OTHER): Payer: 59 | Admitting: Emergency Medicine

## 2013-05-21 VITALS — BP 100/66 | HR 98 | Temp 98.1°F | Resp 18 | Ht 59.5 in | Wt 125.2 lb

## 2013-05-21 DIAGNOSIS — R05 Cough: Secondary | ICD-10-CM

## 2013-05-21 DIAGNOSIS — R059 Cough, unspecified: Secondary | ICD-10-CM

## 2013-05-21 DIAGNOSIS — J209 Acute bronchitis, unspecified: Secondary | ICD-10-CM

## 2013-05-21 LAB — POCT CBC
GRANULOCYTE PERCENT: 64.1 % (ref 37–80)
HEMATOCRIT: 41.4 % (ref 37.7–47.9)
Hemoglobin: 13 g/dL (ref 12.2–16.2)
LYMPH, POC: 2.6 (ref 0.6–3.4)
MCH, POC: 27.4 pg (ref 27–31.2)
MCHC: 31.4 g/dL — AB (ref 31.8–35.4)
MCV: 87.4 fL (ref 80–97)
MID (CBC): 0.5 (ref 0–0.9)
MPV: 8.4 fL (ref 0–99.8)
PLATELET COUNT, POC: 383 10*3/uL (ref 142–424)
POC GRANULOCYTE: 5.6 (ref 2–6.9)
POC LYMPH %: 29.9 % (ref 10–50)
POC MID %: 6 % (ref 0–12)
RBC: 4.74 M/uL (ref 4.04–5.48)
RDW, POC: 14.4 %
WBC: 8.8 10*3/uL (ref 4.6–10.2)

## 2013-05-21 LAB — POCT INFLUENZA A/B
Influenza A, POC: NEGATIVE
Influenza B, POC: NEGATIVE

## 2013-05-21 MED ORDER — AZITHROMYCIN 250 MG PO TABS: ORAL_TABLET | ORAL | Status: DC

## 2013-05-21 MED ORDER — BENZONATATE 200 MG PO CAPS: 200.0000 mg | ORAL_CAPSULE | Freq: Three times a day (TID) | ORAL | Status: DC | PRN

## 2013-05-21 NOTE — Patient Instructions (Signed)

## 2013-05-21 NOTE — Progress Notes (Signed)
Subjective:    Patient ID: Shelly [name], female    DOB: Oct 31, 1985, 28 y.o.   MRN: [id number]  HPI This chart was scribed for Remo Lipps Crewe Heathman-MD, by Lovena Le Day, Scribe. This patient was seen in room 11 and the patient's care was started at 3:23 PM.  HPI Comments: Shelly [name] is a 28 y.o. female who presents to the [facility] complaining of a cough, onset 3 weeks ago that has gradually worsened now w/nasal congestion, HA. She was seen about 2 and a half weeks ago for that problem and was tx w/antibiotics which resolved her otalgia but not her cough or congestion. She reports that last PM she worsened w/a cough which seems to give her a burning sensation and chest tightness when she has a coughing fit and sometimes brings up some phlegm while coughing.   LNMP 2 weeks ago  Patient Active Problem List   Diagnosis Date Noted  . Abdominal pain, chronic, epigastric 02/15/2013  . Change in bowel habits 02/15/2013  . Bloating 02/15/2013  . Anemia, iron deficiency 02/15/2013  . Secondary female infertility 04/21/2012  . Endometriosis of pelvis 04/21/2012   Past Surgical History  Procedure Laterality Date  . Cesarean section  09/17/00, 08/31/02, 06/14/08    x 3  . Laparoscopy  04/21/2012    Procedure: LAPAROSCOPY OPERATIVE;  Surgeon: Osborne Oman, MD;  Location: [facility];  Service: Gynecology;  Laterality: N/A;  Peritoneal biopsies  . Chromopertubation  04/21/2012    Procedure: CHROMOPERTUBATION;  Surgeon: Osborne Oman, MD;  Location: [facility];  Service: Gynecology;  Laterality: N/A;  . Laparoscopic lysis of adhesions  04/21/2012    Procedure: LAPAROSCOPIC LYSIS OF ADHESIONS;  Surgeon: Osborne Oman, MD;  Location: [facility];  Service: Gynecology;  Laterality: N/A;   Family History  Problem Relation Age of Onset  . Breast cancer Mother   . Breast cancer Paternal Aunt   . Colon cancer Other     greatgrandfather and great uncle   History   Social History  . Marital Status:  Married    Spouse Name: N/A    Number of Children: 3  . Years of Education: N/A   Occupational History  . student    Social History Main Topics  . Smoking status: Never Smoker   . Smokeless tobacco: Never Used  . Alcohol Use: No  . Drug Use: No  . Sexual Activity: Yes    Birth Control/ Protection: None   Other Topics Concern  . Not on file   Social History Narrative  . No narrative on file   No Known Allergies  Results for orders placed in visit on 05/21/13  POCT INFLUENZA A/B      Result Value Range   Influenza A, POC Negative     Influenza B, POC Negative     Review of Systems  Constitutional: Negative for fever and chills.  HENT: Positive for congestion.   Respiratory: Positive for cough and chest tightness. Negative for shortness of breath.   Cardiovascular: Negative for chest pain.  Gastrointestinal: Negative for abdominal pain.  Musculoskeletal: Negative for back pain.  Neurological: Positive for headaches.      Objective:   Physical Exam Nursing note and vitals reviewed. Constitutional: Patient is oriented to person, place, and time. Patient appears well-developed and well-nourished. No distress.  HENT:  Head: Normocephalic and atraumatic.  Neck: Neck supple. No tracheal deviation present.  Cardiovascular: Normal rate, regular rhythm and normal heart sounds.  No murmur heard. Pulmonary/Chest: Effort normal and breath sounds normal. No respiratory distress. Patient has no wheezes. Patient has no rales.  Musculoskeletal: Normal range of motion.  Neurological: Patient is alert and oriented to person, place, and time.  Skin: Skin is warm and dry.  Psychiatric: Patient has a normal mood and affect. Patient's behavior is normal.   Triage Vitals: BP 100/66  Pulse 98  Temp(Src) 98.1 F (36.7 C) (Oral)  Resp 18  Ht 4' 11.5" (1.511 m)  Wt 125 lb 3.2 oz (56.79 kg)  BMI 24.87 kg/m2  SpO2 97%  DIAGNOSTIC STUDIES: Oxygen Saturation is 97% on room air,  nomral by my interpretation.    COORDINATION OF CARE: At 320 PM Discussed treatment plan with patient which includes blood work, flu swab. Patient agrees.  Results for orders placed in visit on 05/21/13  POCT INFLUENZA A/B      Result Value Range   Influenza A, POC Negative     Influenza B, POC Negative    POCT CBC      Result Value Range   WBC 8.8  4.6 - 10.2 K/uL   Lymph, poc 2.6  0.6 - 3.4   POC LYMPH PERCENT 29.9  10 - 50 %L   MID (cbc) 0.5  0 - 0.9   POC MID % 6.0  0 - 12 %M   POC Granulocyte 5.6  2 - 6.9   Granulocyte percent 64.1  37 - 80 %G   RBC 4.74  4.04 - 5.48 M/uL   Hemoglobin 13.0  12.2 - 16.2 g/dL   HCT, POC 41.4  37.7 - 47.9 %   MCV 87.4  80 - 97 fL   MCH, POC 27.4  27 - 31.2 pg   MCHC 31.4 (*) 31.8 - 35.4 g/dL   RDW, POC 14.4     Platelet Count, POC 383  142 - 424 K/uL   MPV 8.4  0 - 99.8 fL      Assessment & Plan:  We'll treat with a Z-Pak. Tessalon, and Mucinex and cover for bronchitis.

## 2013-10-01 ENCOUNTER — Ambulatory Visit (INDEPENDENT_AMBULATORY_CARE_PROVIDER_SITE_OTHER): Payer: 59

## 2013-10-01 ENCOUNTER — Ambulatory Visit (INDEPENDENT_AMBULATORY_CARE_PROVIDER_SITE_OTHER): Payer: 59 | Admitting: Emergency Medicine

## 2013-10-01 VITALS — BP 116/68 | HR 98 | Temp 97.6°F | Resp 16 | Ht 61.5 in | Wt 125.8 lb

## 2013-10-01 DIAGNOSIS — R05 Cough: Secondary | ICD-10-CM

## 2013-10-01 DIAGNOSIS — J328 Other chronic sinusitis: Secondary | ICD-10-CM

## 2013-10-01 DIAGNOSIS — J329 Chronic sinusitis, unspecified: Secondary | ICD-10-CM

## 2013-10-01 DIAGNOSIS — R059 Cough, unspecified: Secondary | ICD-10-CM

## 2013-10-01 LAB — POCT CBC
Granulocyte percent: 66.1 %G (ref 37–80)
HEMATOCRIT: 38.2 % (ref 37.7–47.9)
HEMOGLOBIN: 12.2 g/dL (ref 12.2–16.2)
LYMPH, POC: 2.8 (ref 0.6–3.4)
MCH: 26.8 pg — AB (ref 27–31.2)
MCHC: 31.9 g/dL (ref 31.8–35.4)
MCV: 83.8 fL (ref 80–97)
MID (cbc): 0.5 (ref 0–0.9)
MPV: 7.7 fL (ref 0–99.8)
POC Granulocyte: 6.5 (ref 2–6.9)
POC LYMPH PERCENT: 28.6 %L (ref 10–50)
POC MID %: 5.3 %M (ref 0–12)
Platelet Count, POC: 451 10*3/uL — AB (ref 142–424)
RBC: 4.56 M/uL (ref 4.04–5.48)
RDW, POC: 14.8 %
WBC: 9.8 10*3/uL (ref 4.6–10.2)

## 2013-10-01 LAB — POCT URINE PREGNANCY: PREG TEST UR: NEGATIVE

## 2013-10-01 MED ORDER — BENZONATATE 200 MG PO CAPS: 200.0000 mg | ORAL_CAPSULE | Freq: Three times a day (TID) | ORAL | Status: DC | PRN

## 2013-10-01 MED ORDER — ALBUTEROL SULFATE HFA 108 (90 BASE) MCG/ACT IN AERS: 2.0000 | INHALATION_SPRAY | RESPIRATORY_TRACT | Status: DC | PRN

## 2013-10-01 MED ORDER — AZITHROMYCIN 250 MG PO TABS: ORAL_TABLET | ORAL | Status: DC

## 2013-10-01 MED ORDER — FLUTICASONE PROPIONATE 50 MCG/ACT NA SUSP: 2.0000 | Freq: Every day | NASAL | Status: DC

## 2013-10-01 NOTE — Patient Instructions (Signed)
Bronchitis Bronchitis is inflammation of the airways that extend from the windpipe into the lungs (bronchi). The inflammation often causes mucus to develop, which leads to a cough. If the inflammation becomes severe, it may cause shortness of breath. CAUSES  Bronchitis may be caused by:   Viral infections.   Bacteria.   Cigarette smoke.   Allergens, pollutants, and other irritants.  SIGNS AND SYMPTOMS  The most common symptom of bronchitis is a frequent cough that produces mucus. Other symptoms include:  Fever.   Body aches.   Chest congestion.   Chills.   Shortness of breath.   Sore throat.  DIAGNOSIS  Bronchitis is usually diagnosed through a medical history and physical exam. Tests, such as chest X-rays, are sometimes done to rule out other conditions.  TREATMENT  You may need to avoid contact with whatever caused the problem (smoking, for example). Medicines are sometimes needed. These may include:  Antibiotics. These may be prescribed if the condition is caused by bacteria.  Cough suppressants. These may be prescribed for relief of cough symptoms.   Inhaled medicines. These may be prescribed to help open your airways and make it easier for you to breathe.   Steroid medicines. These may be prescribed for those with recurrent (chronic) bronchitis. HOME CARE INSTRUCTIONS  Get plenty of rest.   Drink enough fluids to keep your urine clear or pale yellow (unless you have a medical condition that requires fluid restriction). Increasing fluids may help thin your secretions and will prevent dehydration.   Only take over-the-counter or prescription medicines as directed by your health care provider.  Only take antibiotics as directed. Make sure you finish them even if you start to feel better.  Avoid secondhand smoke, irritating chemicals, and strong fumes. These will make bronchitis worse. If you are a smoker, quit smoking. Consider using nicotine gum or  skin patches to help control withdrawal symptoms. Quitting smoking will help your lungs heal faster.   Put a cool-mist humidifier in your bedroom at night to moisten the air. This may help loosen mucus. Change the water in the humidifier daily. You can also run the hot water in your shower and sit in the bathroom with the door closed for 5-10 minutes.   Follow up with your health care provider as directed.   Wash your hands frequently to avoid catching bronchitis again or spreading an infection to others.  SEEK MEDICAL CARE IF: Your symptoms do not improve after 1 week of treatment.  SEEK IMMEDIATE MEDICAL CARE IF:  Your fever increases.  You have chills.   You have chest pain.   You have worsening shortness of breath.   You have bloody sputum.  You faint.  You have lightheadedness.  You have a severe headache.   You vomit repeatedly. MAKE SURE YOU:   Understand these instructions.  Will watch your condition.  Will get help right away if you are not doing well or get worse. Document Released: 03/30/2005 Document Revised: 01/18/2013 Document Reviewed: 11/22/2012 [facility] Patient Information 2015 [place], [place]. This information is not intended to replace advice given to you by your health care provider. Make sure you discuss any questions you have with your health care provider. Cough, Adult  A cough is a reflex that helps clear your throat and airways. It can help heal the body or may be a reaction to an irritated airway. A cough may only last 2 or 3 weeks (acute) or may last more than 8 weeks (chronic).  CAUSES Acute cough:  Viral or bacterial infections. Chronic cough:  Infections.  Allergies.  Asthma.  Post-nasal drip.  Smoking.  Heartburn or acid reflux.  Some medicines.  Chronic lung problems (COPD).  Cancer. SYMPTOMS   Cough.  Fever.  Chest pain.  Increased breathing rate.  High-pitched whistling sound when breathing  (wheezing).  Colored mucus that you cough up (sputum). TREATMENT   A bacterial cough may be treated with antibiotic medicine.  A viral cough must run its course and will not respond to antibiotics.  Your caregiver may recommend other treatments if you have a chronic cough. HOME CARE INSTRUCTIONS   Only take over-the-counter or prescription medicines for pain, discomfort, or fever as directed by your caregiver. Use cough suppressants only as directed by your caregiver.  Use a cold steam vaporizer or humidifier in your bedroom or home to help loosen secretions.  Sleep in a semi-upright position if your cough is worse at night.  Rest as needed.  Stop smoking if you smoke. SEEK IMMEDIATE MEDICAL CARE IF:   You have pus in your sputum.  Your cough starts to worsen.  You cannot control your cough with suppressants and are losing sleep.  You begin coughing up blood.  You have difficulty breathing.  You develop pain which is getting worse or is uncontrolled with medicine.  You have a fever. MAKE SURE YOU:   Understand these instructions.  Will watch your condition.  Will get help right away if you are not doing well or get worse. Document Released: 09/26/2010 Document Revised: 06/22/2011 Document Reviewed: 09/26/2010 [facility] Patient Information 2015 [place], [place]. This information is not intended to replace advice given to you by your health care provider. Make sure you discuss any questions you have with your health care provider.

## 2013-10-01 NOTE — Progress Notes (Signed)
Subjective:  This chart was scribed for Arlyss Queen, MD by Mercy Moore, Medial Scribe. This patient was seen in room 14 and the patient's care was started at 10:58 AM.    Patient ID: Shelly [name], female    DOB: Feb 06, 1986, 28 y.o.   MRN: [id number]  HPI HPI Comments: Shelly [name] is a 28 y.o. female who presents to the [facility] complaining of myalgais, rhinorrhea, and cough, onset two weeks. Then patient states that her symptoms developed into head and sinus pressure exacerbated with bending over. Patient is now complaining primarily of congestion, diaphoresis, severe productive croupy cough with green sputum. Patient states that her cough is worse in the morning; patient reports wakening with burning chest pain. Patient reports an earlier fever that has since resolved. Patient has attempted treatment with many OTC medications, without relief. Patient reports recent sick contacts at home: her son whose symptoms have resolved. Patient denies history of seasonal allergies. Patient denies history of tobacco use. Last normal menstrual cycle: 06/17.   Patient Active Problem List   Diagnosis Date Noted  . Abdominal pain, chronic, epigastric 02/15/2013  . Change in bowel habits 02/15/2013  . Bloating 02/15/2013  . Anemia, iron deficiency 02/15/2013  . Secondary female infertility 04/21/2012  . Endometriosis of pelvis 04/21/2012   Past Medical History  Diagnosis Date  . PONV (postoperative nausea and vomiting)   . Endometriosis of pelvis 04/21/2012    Diagnosed on pathology after operative laparoscopy on 04/21/2012   . Anxiety    Past Surgical History  Procedure Laterality Date  . Cesarean section  09/17/00, 08/31/02, 06/14/08    x 3  . Laparoscopy  04/21/2012    Procedure: LAPAROSCOPY OPERATIVE;  Surgeon: Osborne Oman, MD;  Location: [facility];  Service: Gynecology;  Laterality: N/A;  Peritoneal biopsies  . Chromopertubation  04/21/2012    Procedure: CHROMOPERTUBATION;   Surgeon: Osborne Oman, MD;  Location: [facility];  Service: Gynecology;  Laterality: N/A;  . Laparoscopic lysis of adhesions  04/21/2012    Procedure: LAPAROSCOPIC LYSIS OF ADHESIONS;  Surgeon: Osborne Oman, MD;  Location: [facility];  Service: Gynecology;  Laterality: N/A;   No Known Allergies Prior to Admission medications   Not on File   History   Social History  . Marital Status: Married    Spouse Name: N/A    Number of Children: 3  . Years of Education: N/A   Occupational History  . student    Social History Main Topics  . Smoking status: Never Smoker   . Smokeless tobacco: Never Used  . Alcohol Use: No  . Drug Use: No  . Sexual Activity: Yes    Birth Control/ Protection: None   Other Topics Concern  . Not on file   Social History Narrative  . No narrative on file       Review of Systems  Constitutional: Positive for diaphoresis.  HENT: Positive for congestion and rhinorrhea.   Respiratory: Positive for cough and wheezing.   Cardiovascular: Positive for chest pain.  Gastrointestinal: Negative for vomiting and diarrhea.  Neurological: Positive for headaches.       Objective:   Physical Exam  CONSTITUTIONAL: Well developed/well nourished HEAD: Normocephalic/atraumatic EYES: EOMI/PERRL ENMT: Mucous membranes moist; nasal congestion; throat is normal NECK: supple no meningeal signs SPINE:entire spine nontender CV: S1/S2 noted, no murmurs/rubs/gallops noted LUNGS: Lungs are clear to auscultation bilaterally, no apparent distress; rhonchi bilaterally  ABDOMEN: soft, nontender, no rebound or guarding GU:no  cva tenderness NEURO: Pt is awake/alert, moves all extremitiesx4 EXTREMITIES: pulses normal, full ROM SKIN: warm, color normal PSYCH: no abnormalities of mood noted  Filed Vitals:   10/01/13 1023  BP: 116/68  Pulse: 98  Temp: 97.6 F (36.4 C)  Resp: 16   Results for orders placed in visit on 10/01/13  POCT CBC      Result Value Ref Range   WBC  9.8  4.6 - 10.2 K/uL   Lymph, poc 2.8  0.6 - 3.4   POC LYMPH PERCENT 28.6  10 - 50 %L   MID (cbc) 0.5  0 - 0.9   POC MID % 5.3  0 - 12 %M   POC Granulocyte 6.5  2 - 6.9   Granulocyte percent 66.1  37 - 80 %G   RBC 4.56  4.04 - 5.48 M/uL   Hemoglobin 12.2  12.2 - 16.2 g/dL   HCT, POC 38.2  37.7 - 47.9 %   MCV 83.8  80 - 97 fL   MCH, POC 26.8 (*) 27 - 31.2 pg   MCHC 31.9  31.8 - 35.4 g/dL   RDW, POC 14.8     Platelet Count, POC 451 (*) 142 - 424 K/uL   MPV 7.7  0 - 99.8 fL  POCT URINE PREGNANCY      Result Value Ref Range   Preg Test, Ur Negative     [facility] reading (PRIMARY) by  Dr. Everlene Farrier there are mild increased markings seen on the lateral no definite infiltrates seen.      Assessment & Plan:  We'll treat with a Z-Pak Tessalon Perles and Flonase.. no wheezing was heard on exam. I do think it would be good to check a peak flow I tried to reach her by phone and will see if she'll run for a peak flow test. The patient had a history of asthma as a child. She may have some component of reactive airways disease. She was given a nebulizer treatment and did not have much improvement in her peak flow. Clinically she felt better. We'll try an albuterol HFA inhaler and see if that helps

## 2013-10-14 ENCOUNTER — Encounter: Payer: Self-pay | Admitting: Emergency Medicine

## 2013-10-15 MED ORDER — AMOXICILLIN 875 MG PO TABS: 875.0000 mg | ORAL_TABLET | Freq: Two times a day (BID) | ORAL | Status: AC

## 2013-10-15 MED ORDER — PREDNISONE 10 MG PO TABS: ORAL_TABLET | ORAL | Status: AC

## 2014-01-20 ENCOUNTER — Ambulatory Visit (INDEPENDENT_AMBULATORY_CARE_PROVIDER_SITE_OTHER): Payer: 59 | Admitting: Family Medicine

## 2014-01-20 ENCOUNTER — Ambulatory Visit (INDEPENDENT_AMBULATORY_CARE_PROVIDER_SITE_OTHER): Payer: 59

## 2014-01-20 VITALS — BP 108/86 | HR 110 | Temp 98.9°F | Resp 20 | Ht 61.0 in | Wt 129.0 lb

## 2014-01-20 DIAGNOSIS — R350 Frequency of micturition: Secondary | ICD-10-CM

## 2014-01-20 DIAGNOSIS — R1012 Left upper quadrant pain: Secondary | ICD-10-CM

## 2014-01-20 DIAGNOSIS — R1013 Epigastric pain: Secondary | ICD-10-CM

## 2014-01-20 DIAGNOSIS — N390 Urinary tract infection, site not specified: Secondary | ICD-10-CM

## 2014-01-20 DIAGNOSIS — M62830 Muscle spasm of back: Secondary | ICD-10-CM

## 2014-01-20 LAB — POCT URINALYSIS DIPSTICK
Bilirubin, UA: NEGATIVE
Blood, UA: NEGATIVE
GLUCOSE UA: NEGATIVE
Ketones, UA: NEGATIVE
LEUKOCYTES UA: NEGATIVE
Nitrite, UA: NEGATIVE
Protein, UA: NEGATIVE
SPEC GRAV UA: 1.02
UROBILINOGEN UA: 1
pH, UA: 7

## 2014-01-20 LAB — POCT UA - MICROSCOPIC ONLY
Bacteria, U Microscopic: NEGATIVE
CASTS, UR, LPF, POC: NEGATIVE
Crystals, Ur, HPF, POC: NEGATIVE
Epithelial cells, urine per micros: NEGATIVE
Mucus, UA: NEGATIVE
Yeast, UA: NEGATIVE

## 2014-01-20 LAB — COMPREHENSIVE METABOLIC PANEL
ALK PHOS: 54 U/L (ref 39–117)
ALT: <8 U/L (ref 0–35)
AST: 14 U/L (ref 0–37)
Albumin: 4.5 g/dL (ref 3.5–5.2)
BILIRUBIN TOTAL: 0.9 mg/dL (ref 0.2–1.2)
BUN: 14 mg/dL (ref 6–23)
CO2: 27 mEq/L (ref 19–32)
Calcium: 9.4 mg/dL (ref 8.4–10.5)
Chloride: 102 mEq/L (ref 96–112)
Creat: 0.7 mg/dL (ref 0.50–1.10)
GLUCOSE: 84 mg/dL (ref 70–99)
Potassium: 4.3 mEq/L (ref 3.5–5.3)
Sodium: 139 mEq/L (ref 135–145)
Total Protein: 7.5 g/dL (ref 6.0–8.3)

## 2014-01-20 LAB — POCT CBC
Granulocyte percent: 60.7 %G (ref 37–80)
HEMATOCRIT: 36.8 % — AB (ref 37.7–47.9)
Hemoglobin: 12.4 g/dL (ref 12.2–16.2)
Lymph, poc: 2.2 (ref 0.6–3.4)
MCH: 27.4 pg (ref 27–31.2)
MCHC: 33.6 g/dL (ref 31.8–35.4)
MCV: 81.4 fL (ref 80–97)
MID (cbc): 0.6 (ref 0–0.9)
MPV: 7 fL (ref 0–99.8)
POC Granulocyte: 4.3 (ref 2–6.9)
POC LYMPH PERCENT: 31.3 %L (ref 10–50)
POC MID %: 8 %M (ref 0–12)
Platelet Count, POC: 347 10*3/uL (ref 142–424)
RBC: 4.52 M/uL (ref 4.04–5.48)
RDW, POC: 15 %
WBC: 7.1 10*3/uL (ref 4.6–10.2)

## 2014-01-20 LAB — POCT SEDIMENTATION RATE: POCT SED RATE: 32 mm/h — AB (ref 0–22)

## 2014-01-20 LAB — LIPASE: Lipase: 37 U/L (ref 0–75)

## 2014-01-20 MED ORDER — HYDROCODONE-ACETAMINOPHEN 5-325 MG PO TABS: 1.0000 | ORAL_TABLET | Freq: Four times a day (QID) | ORAL | Status: DC | PRN

## 2014-01-20 MED ORDER — KETOROLAC TROMETHAMINE 60 MG/2ML IM SOLN
60.0000 mg | Freq: Once | INTRAMUSCULAR | Status: AC
Start: 2014-01-20 — End: 2014-01-20
  Administered 2014-01-20: 60 mg via INTRAMUSCULAR

## 2014-01-20 MED ORDER — CYCLOBENZAPRINE HCL 10 MG PO TABS: 10.0000 mg | ORAL_TABLET | Freq: Three times a day (TID) | ORAL | Status: DC | PRN

## 2014-01-20 MED ORDER — MELOXICAM 15 MG PO TABS: 15.0000 mg | ORAL_TABLET | Freq: Every day | ORAL | Status: DC

## 2014-01-20 NOTE — Progress Notes (Signed)
This chart was scribed for Delman Cheadle, MD by Einar Pheasant, ED Scribe. This patient was seen in room 11 and the patient's care was started at 1:03 PM.  Subjective:    Patient ID: Shelly [name], female    DOB: 1986-01-10, 28 y.o.   MRN: [id number]  Chief Complaint  Patient presents with  . Back Pain    right side, x 1 day  . Nausea  . Urinary Frequency    HPI Shelly [name] is a 28 y.o. female with a hx of abdominal pain.  Pt comes in complaining of acute onset right sided back pain that started yesterday while sitting at a football game. She states that last night she could not sleep well secondary to the pain. She also reports the pain radiating anterior. Pt does endorse lifting patients but she does not think that the pain was brought on by that. Denies any chain in her pain level with BM or urination. She does state that the pain is worsened by sitting in one positing for a long period of time.  Shelly [name] also reports some urinary frequency a week ago but she thinks it may be associated to some mild anxiety that she was experiencing early this week. Urine sample was obtained and it came back negative. Pt also endorses decreased appetite with associated nausea that started yesterday. Pt denies taking any OTC medication to alleviate her pain. Also denies abdominal pain, fever, chills, diaphoresis, vaginal discharge, pelvic pain, taking anything for birth control, or chest pain.   Patient Active Problem List   Diagnosis Date Noted  . Abdominal pain, chronic, epigastric 02/15/2013  . Change in bowel habits 02/15/2013  . Bloating 02/15/2013  . Anemia, iron deficiency 02/15/2013  . Secondary female infertility 04/21/2012  . Endometriosis of pelvis 04/21/2012   Past Medical History  Diagnosis Date  . PONV (postoperative nausea and vomiting)   . Endometriosis of pelvis 04/21/2012    Diagnosed on pathology after operative laparoscopy on 04/21/2012   . Anxiety    Past Surgical History    Procedure Laterality Date  . Cesarean section  09/17/00, 08/31/02, 06/14/08    x 3  . Laparoscopy  04/21/2012    Procedure: LAPAROSCOPY OPERATIVE;  Surgeon: Osborne Oman, MD;  Location: [facility];  Service: Gynecology;  Laterality: N/A;  Peritoneal biopsies  . Chromopertubation  04/21/2012    Procedure: CHROMOPERTUBATION;  Surgeon: Osborne Oman, MD;  Location: [facility];  Service: Gynecology;  Laterality: N/A;  . Laparoscopic lysis of adhesions  04/21/2012    Procedure: LAPAROSCOPIC LYSIS OF ADHESIONS;  Surgeon: Osborne Oman, MD;  Location: [facility];  Service: Gynecology;  Laterality: N/A;   No Known Allergies Prior to Admission medications   Medication Sig Start Date End Date Taking? Authorizing Provider  albuterol (PROVENTIL HFA;VENTOLIN HFA) 108 (90 BASE) MCG/ACT inhaler Inhale 2 puffs into the lungs every 4 (four) hours as needed for wheezing or shortness of breath (cough, shortness of breath or wheezing.). 10/01/13  Yes Darlyne Russian, MD   History   Social History  . Marital Status: Married    Spouse Name: N/A    Number of Children: 3  . Years of Education: N/A   Occupational History  . student    Social History Main Topics  . Smoking status: Never Smoker   . Smokeless tobacco: Never Used  . Alcohol Use: No  . Drug Use: No  . Sexual Activity: Yes    Birth Control/ Protection: None  Other Topics Concern  . Not on file   Social History Narrative  . No narrative on file     Review of Systems  Constitutional: Positive for appetite change. Negative for fever.  HENT: Negative for congestion, ear discharge and sinus pressure.   Eyes: Negative for discharge.  Respiratory: Negative for cough.   Cardiovascular: Negative for chest pain.  Gastrointestinal: Positive for nausea. Negative for abdominal pain and diarrhea.  Genitourinary: Positive for frequency. Negative for hematuria.  Musculoskeletal: Positive for back pain.  Skin: Negative for rash.  Neurological: Negative for  seizures and headaches.  Psychiatric/Behavioral: Negative for hallucinations.      Triage vitals: BP 108/86  Pulse 110  Temp(Src) 98.9 F (37.2 C)  Resp 20  Ht 5\' 1"  (1.549 m)  Wt 129 lb (58.514 kg)  BMI 24.39 kg/m2  SpO2 100%  LMP 01/07/2014  Objective:   Physical Exam  Nursing note and vitals reviewed. Constitutional: She appears well-developed and well-nourished. No distress.  HENT:  Head: Normocephalic and atraumatic.  Eyes: Conjunctivae are normal. Right eye exhibits no discharge. Left eye exhibits no discharge.  Neck: Neck supple.  Cardiovascular: Normal rate, regular rhythm, S1 normal, S2 normal and normal heart sounds.  Exam reveals no gallop and no friction rub.   No murmur heard. Pulmonary/Chest: Effort normal and breath sounds normal. No respiratory distress.  Abdominal: Soft. She exhibits no distension. There is tenderness (mild) in the left upper quadrant. There is CVA tenderness. There is no rigidity, no rebound, no guarding and negative Murphy's sign.  CVA tenderness on the right. No palpable masses.   Musculoskeletal: She exhibits no edema and no tenderness.  Mild point tenderness over the right lumbar paraspinal area.   Neurological: She is alert.  Skin: Skin is warm and dry.  Psychiatric: She has a normal mood and affect. Her behavior is normal. Thought content normal.    Primary KUB reading by Dr. Brigitte Pulse --Moderate stool burden. No abnormal gas pattern. Liver shadow appears within normal limits. No gall stones or kidney stones noted. Possible left pelvic phlebolith.  ADDENDUM: radiology over read notes 32mm left kidney stone in lower ureter.  Results for orders placed in visit on 01/20/14  POCT UA - MICROSCOPIC ONLY      Result Value Ref Range   WBC, Ur, HPF, POC 0-2     RBC, urine, microscopic 0-1     Bacteria, U Microscopic neg     Mucus, UA neg     Epithelial cells, urine per micros neg     Crystals, Ur, HPF, POC neg     Casts, Ur, LPF, POC neg      Yeast, UA neg    POCT URINALYSIS DIPSTICK      Result Value Ref Range   Color, UA yellow     Clarity, UA clear     Glucose, UA neg     Bilirubin, UA neg     Ketones, UA neg     Spec Grav, UA 1.020     Blood, UA neg     pH, UA 7.0     Protein, UA neg     Urobilinogen, UA 1.0     Nitrite, UA neg     Leukocytes, UA Negative    POCT CBC      Result Value Ref Range   WBC 7.1  4.6 - 10.2 K/uL   Lymph, poc 2.2  0.6 - 3.4   POC LYMPH PERCENT 31.3  10 - 50 %L  MID (cbc) 0.6  0 - 0.9   POC MID % 8.0  0 - 12 %M   POC Granulocyte 4.3  2 - 6.9   Granulocyte percent 60.7  37 - 80 %G   RBC 4.52  4.04 - 5.48 M/uL   Hemoglobin 12.4  12.2 - 16.2 g/dL   HCT, POC 36.8 (*) 37.7 - 47.9 %   MCV 81.4  80 - 97 fL   MCH, POC 27.4  27 - 31.2 pg   MCHC 33.6  31.8 - 35.4 g/dL   RDW, POC 15.0     Platelet Count, POC 347  142 - 424 K/uL   MPV 7.0  0 - 99.8 fL       Assessment & Plan:   Urinary frequency - Plan: POCT UA - Microscopic Only, POCT urinalysis dipstick, Urine culture  Abdominal pain, epigastric - Plan: POCT SEDIMENTATION RATE, POCT CBC, Comprehensive metabolic panel, Urine culture, Lipase, DG Abd 1 View, ketorolac (TORADOL) injection 60 mg  Abdominal pain, left upper quadrant - Plan: POCT SEDIMENTATION RATE, POCT CBC, Comprehensive metabolic panel, Urine culture, Lipase, DG Abd 1 View, ketorolac (TORADOL) injection 60 mg  Lumbar paraspinal muscle spasm - Plan: ketorolac (TORADOL) injection 60 mg  ADDENDUM - pt has kidney stone - strain urine and bring in for lab eval, push fluids. rtc if sxs cont.  Meds ordered this encounter  Medications  . meloxicam (MOBIC) 15 MG tablet    Sig: Take 1 tablet (15 mg total) by mouth daily.    Dispense:  30 tablet    Refill:  0  . cyclobenzaprine (FLEXERIL) 10 MG tablet    Sig: Take 1 tablet (10 mg total) by mouth 3 (three) times daily as needed for muscle spasms.    Dispense:  30 tablet    Refill:  0  . HYDROcodone-acetaminophen (NORCO/VICODIN)  5-325 MG per tablet    Sig: Take 1 tablet by mouth every 6 (six) hours as needed for moderate pain.    Dispense:  15 tablet    Refill:  0  . ketorolac (TORADOL) injection 60 mg    Sig:     I personally performed the services described in this documentation, which was scribed in my presence. The recorded information has been reviewed and considered, and addended by me as needed.  Delman Cheadle, MD MPH

## 2014-01-20 NOTE — Patient Instructions (Signed)

## 2014-01-22 LAB — URINE CULTURE

## 2014-01-24 MED ORDER — AMOXICILLIN 875 MG PO TABS
875.0000 mg | ORAL_TABLET | Freq: Two times a day (BID) | ORAL | Status: DC
Start: 2014-01-24 — End: 2014-02-02

## 2014-01-24 NOTE — Addendum Note (Signed)
Addended by: Delman Cheadle on: 01/24/2014 01:35 AM   Modules accepted: Orders

## 2014-01-26 ENCOUNTER — Other Ambulatory Visit: Payer: Self-pay

## 2014-02-02 ENCOUNTER — Ambulatory Visit (INDEPENDENT_AMBULATORY_CARE_PROVIDER_SITE_OTHER): Payer: 59 | Admitting: Family Medicine

## 2014-02-02 ENCOUNTER — Encounter: Payer: Self-pay | Admitting: Family Medicine

## 2014-02-02 VITALS — BP 115/75 | HR 96 | Temp 98.5°F | Resp 16 | Ht 61.25 in | Wt 131.0 lb

## 2014-02-02 DIAGNOSIS — R109 Unspecified abdominal pain: Secondary | ICD-10-CM

## 2014-02-02 LAB — POCT UA - MICROSCOPIC ONLY
Casts, Ur, LPF, POC: NEGATIVE
Crystals, Ur, HPF, POC: NEGATIVE
RBC, URINE, MICROSCOPIC: 0
WBC, Ur, HPF, POC: 0
Yeast, UA: NEGATIVE

## 2014-02-02 LAB — POCT URINALYSIS DIPSTICK
Bilirubin, UA: NEGATIVE
Glucose, UA: NEGATIVE
Ketones, UA: NEGATIVE
NITRITE UA: NEGATIVE
PROTEIN UA: NEGATIVE
Spec Grav, UA: 1.015
UROBILINOGEN UA: 0.2
pH, UA: 5.5

## 2014-02-02 MED ORDER — NABUMETONE 750 MG PO TABS: 750.0000 mg | ORAL_TABLET | Freq: Two times a day (BID) | ORAL | Status: DC | PRN

## 2014-02-02 NOTE — Progress Notes (Signed)
Subjective:    Patient ID: Shelly [name], female    DOB: [birth year], 28 y.o.   MRN: [id number] This chart was scribed for Delman Cheadle, MD by Marti Sleigh, Medical Scribe. This patient was seen in Room 25 and the patient's care was started a 1:41 PM.  Chief Complaint  Patient presents with  . Follow-up    kidney infection    HPI Past Medical History  Diagnosis Date  . PONV (postoperative nausea and vomiting)   . Endometriosis of pelvis 04/21/2012    Diagnosed on pathology after operative laparoscopy on 04/21/2012   . Anxiety    No Known Allergies Current Outpatient Prescriptions on File Prior to Visit  Medication Sig Dispense Refill  . amoxicillin (AMOXIL) 875 MG tablet Take 1 tablet (875 mg total) by mouth 2 (two) times daily.  14 tablet  0  . cyclobenzaprine (FLEXERIL) 10 MG tablet Take 1 tablet (10 mg total) by mouth 3 (three) times daily as needed for muscle spasms.  30 tablet  0  . HYDROcodone-acetaminophen (NORCO/VICODIN) 5-325 MG per tablet Take 1 tablet by mouth every 6 (six) hours as needed for moderate pain.  15 tablet  0  . meloxicam (MOBIC) 15 MG tablet Take 1 tablet (15 mg total) by mouth daily.  30 tablet  0  . albuterol (PROVENTIL HFA;VENTOLIN HFA) 108 (90 BASE) MCG/ACT inhaler Inhale 2 puffs into the lungs every 4 (four) hours as needed for wheezing or shortness of breath (cough, shortness of breath or wheezing.).  1 Inhaler  3   No current facility-administered medications on file prior to visit.   HPI Comments: Shelly [name] is a 28 y.o. female who presents to [facility] complaining of gradually improving right-sided lower back pain. Pt stated her back pain has improved, but she still has waxing and waning pain. Pt states the pain seems to have relocated to her right side from her left. Pt states she completed her amoxicillin prescription. Pt took mobic, and Felxeril for five days without relief, so she discontinued taking the medication. Pt states that she also had sleep  disturbance as a result of the medication. Pt states she is not taking any OTC medication. Pt states the pain is worse with sitting. Pt's x-ray showed a left sided kidney stone.   Pt states she also had a severe back injury from lifting two months ago, that resolved over a couple of weeks. She states that she is not sure whether she is experiencing kidney pain, or back pain because of injury. Pt states she has not been drinking enough water. Pt endorses normal BM.    Review of Systems  Musculoskeletal: Positive for back pain (Occationally radiates down right hip.).  All other systems reviewed and are negative.      Objective:  BP 115/75  Pulse 96  Temp(Src) 98.5 F (36.9 C) (Oral)  Resp 16  Ht 5' 1.25" (1.556 m)  Wt 131 lb (59.421 kg)  BMI 24.54 kg/m2  SpO2 100%  LMP 01/07/2014  Physical Exam  Nursing note and vitals reviewed. Constitutional: She is oriented to person, place, and time. She appears well-developed and well-nourished.  HENT:  Head: Normocephalic and atraumatic.  Eyes: Pupils are equal, round, and reactive to light.  Neck: No JVD present.  Cardiovascular: Normal rate, regular rhythm and normal heart sounds.   No murmur heard. Pulmonary/Chest: Effort normal and breath sounds normal. No respiratory distress. She has no wheezes.  Good air movement.   Abdominal: Soft. Bowel sounds are  normal. She exhibits no distension. There is no hepatosplenomegaly. There is no tenderness.  Musculoskeletal: Normal range of motion.  Mild TTP over lower central thoracic spine. TTP over left upper lumbar perispinal muscles, no TPP on right. No palpable spasms.   Neurological: She is alert and oriented to person, place, and time.  2+ patellar reflexes. Negative straight leg raise. Hip flexors 5/5 strength.  Skin: Skin is warm and dry.  Psychiatric: She has a normal mood and affect. Her behavior is normal.          Assessment & Plan:   If pain does not improve, pt has been  instructed to call so that the office can order abdominal/pelvic CT non-contrast to check for possible kidney stone seen on KUB and r/o gallstones. Right flank pain - Plan: POCT UA - Microscopic Only, POCT urinalysis dipstick, Urine culture  Meds ordered this encounter  Medications  . nabumetone (RELAFEN) 750 MG tablet    Sig: Take 1 tablet (750 mg total) by mouth 2 (two) times daily as needed for moderate pain.    Dispense:  60 tablet    Refill:  0    I personally performed the services described in this documentation, which was scribed in my presence. The recorded information has been reviewed and considered, and addended by me as needed.  Delman Cheadle, MD MPH   Results for orders placed in visit on 02/02/14  URINE CULTURE      Result Value Ref Range   Colony Count 50,000 COLONIES/ML     Organism ID, Bacteria Multiple bacterial morphotypes present, none     Organism ID, Bacteria predominant. Suggest appropriate recollection if      Organism ID, Bacteria clinically indicated.    POCT UA - MICROSCOPIC ONLY      Result Value Ref Range   WBC, Ur, HPF, POC 0     RBC, urine, microscopic 0     Bacteria, U Microscopic trace     Mucus, UA trace     Epithelial cells, urine per micros 2-6     Crystals, Ur, HPF, POC neg     Casts, Ur, LPF, POC neg     Yeast, UA neg    POCT URINALYSIS DIPSTICK      Result Value Ref Range   Color, UA yellow     Clarity, UA cloudy     Glucose, UA neg     Bilirubin, UA neg     Ketones, UA neg     Spec Grav, UA 1.015     Blood, UA trace-intact     pH, UA 5.5     Protein, UA neg     Urobilinogen, UA 0.2     Nitrite, UA neg     Leukocytes, UA small (1+)

## 2014-02-02 NOTE — Patient Instructions (Signed)
Back Pain, Adult °Low back pain is very common. About 1 in 5 people have back pain. The cause of low back pain is rarely dangerous. The pain often gets better over time. About half of people with a sudden onset of back pain feel better in just 2 weeks. About 8 in 10 people feel better by 6 weeks.  °CAUSES °Some common causes of back pain include: °· Strain of the muscles or ligaments supporting the spine. °· Wear and tear (degeneration) of the spinal discs. °· Arthritis. °· Direct injury to the back. °DIAGNOSIS °Most of the time, the direct cause of low back pain is not known. However, back pain can be treated effectively even when the exact cause of the pain is unknown. Answering your caregiver's questions about your overall health and symptoms is one of the most accurate ways to make sure the cause of your pain is not dangerous. If your caregiver needs more information, he or she may order lab work or imaging tests (X-rays or MRIs). However, even if imaging tests show changes in your back, this usually does not require surgery. °HOME CARE INSTRUCTIONS °For many people, back pain returns. Since low back pain is rarely dangerous, it is often a condition that people can learn to manage on their own.  °· Remain active. It is stressful on the back to sit or stand in one place. Do not sit, drive, or stand in one place for more than 30 minutes at a time. Take short walks on level surfaces as soon as pain allows. Try to increase the length of time you walk each day. °· Do not stay in bed. Resting more than 1 or 2 days can delay your recovery. °· Do not avoid exercise or work. Your body is made to move. It is not dangerous to be active, even though your back may hurt. Your back will likely heal faster if you return to being active before your pain is gone. °· Pay attention to your body when you  bend and lift. Many people have less discomfort when lifting if they bend their knees, keep the load close to their bodies, and  avoid twisting. Often, the most comfortable positions are those that put less stress on your recovering back. °· Find a comfortable position to sleep. Use a firm mattress and lie on your side with your knees slightly bent. If you lie on your back, put a pillow under your knees. °· Only take over-the-counter or prescription medicines as directed by your caregiver. Over-the-counter medicines to reduce pain and inflammation are often the most helpful. Your caregiver may prescribe muscle relaxant drugs. These medicines help dull your pain so you can more quickly return to your normal activities and healthy exercise. °· Put ice on the injured area. °· Put ice in a plastic bag. °· Place a towel between your skin and the bag. °· Leave the ice on for 15-20 minutes, 03-04 times a day for the first 2 to 3 days. After that, ice and heat may be alternated to reduce pain and spasms. °· Ask your caregiver about trying back exercises and gentle massage. This may be of some benefit. °· Avoid feeling anxious or stressed. Stress increases muscle tension and can worsen back pain. It is important to recognize when you are anxious or stressed and learn ways to manage it. Exercise is a great option. °SEEK MEDICAL CARE IF: °· You have pain that is not relieved with rest or medicine. °· You have pain that does not improve in 1 week. °· You have new symptoms. °· You are generally not feeling well. °SEEK   IMMEDIATE MEDICAL CARE IF:  °· You have pain that radiates from your back into your legs. °· You develop new bowel or bladder control problems. °· You have unusual weakness or numbness in your arms or legs. °· You develop nausea or vomiting. °· You develop abdominal pain. °· You feel faint. °Document Released: 03/30/2005 Document Revised: 09/29/2011 Document Reviewed: 08/01/2013 °[employer]® Patient Information ©2015 [employer]. This information is not intended to replace advice given to you by your health care provider. Make sure you  discuss any questions you have with your health care provider. ° °Back Injury Prevention °Back injuries can be extremely painful and difficult to heal. After having one back injury, you are much more likely to experience another later on. It is important to learn how to avoid injuring or re-injuring your back. The following tips can help you to prevent a back injury. °PHYSICAL FITNESS °· Exercise regularly and try to develop good tone in your abdominal muscles. Your abdominal muscles provide a lot of the support needed by your back. °· Do aerobic exercises (walking, jogging, biking, swimming) regularly. °· Do exercises that increase balance and strength (tai chi, yoga) regularly. This can decrease your risk of falling and injuring your back. °· Stretch before and after exercising. °· Maintain a healthy weight. The more you weigh, the more stress is placed on your back. For every pound of weight, 10 times that amount of pressure is placed on the back. °DIET °· Talk to your caregiver about how much calcium and vitamin D you need per day. These nutrients help to prevent weakening of the bones (osteoporosis). Osteoporosis can cause broken (fractured) bones that lead to back pain. °· Include good sources of calcium in your diet, such as dairy products, green, leafy vegetables, and products with calcium added (fortified). °· Include good sources of vitamin D in your diet, such as milk and foods that are fortified with vitamin D. °· Consider taking a nutritional supplement or a multivitamin if needed. °· Stop smoking if you smoke. °POSTURE °· Sit and stand up straight. Avoid leaning forward when you sit or hunching over when you stand. °· Choose chairs with good low back (lumbar) support. °· If you work at a desk, sit close to your work so you do not need to lean over. Keep your chin tucked in. Keep your neck drawn back and elbows bent at a right angle. Your arms should look like the letter "L." °· Sit high and close to  the steering wheel when you drive. Add a lumbar support to your car seat if needed. °· Avoid sitting or standing in one position for too long. Take breaks to get up, stretch, and walk around at least once every hour. Take breaks if you are driving for long periods of time. °· Sleep on your side with your knees slightly bent, or sleep on your back with a pillow under your knees. Do not sleep on your stomach. °LIFTING, TWISTING, AND REACHING °· Avoid heavy lifting, especially repetitive lifting. If you must do heavy lifting: °¨ Stretch before lifting. °¨ Work slowly. °¨ Rest between lifts. °¨ Use carts and dollies to move objects when possible. °¨ Make several small trips instead of carrying 1 heavy load. °¨ Ask for help when you need it. °¨ Ask for help when moving big, awkward objects. °· Follow these steps when lifting: °¨ Stand with your feet shoulder-width apart. °¨ Get as close to the object as you can. Do not try to   up heavy objects that are far from your body.  Use handles or lifting straps if they are available.  Bend at your knees. Squat down, but keep your heels off the floor.  Keep your shoulders pulled back, your chin tucked in, and your back straight.  Lift the object slowly, tightening the muscles in your legs, abdomen, and buttocks. Keep the object as close to the center of your body as possible.  When you put a load down, use these same guidelines in reverse.  Do not:  Lift the object above your waist.  Twist at the waist while lifting or carrying a load. Move your feet if you need to turn, not your waist.  Bend over without bending at your knees.  Avoid reaching over your head, across a table, or for an object on a high surface. OTHER TIPS  Avoid wet floors and keep sidewalks clear of ice to prevent falls.  Do not sleep on a mattress that is too soft or too hard.  Keep items that are used frequently within easy reach.  Put heavier objects on shelves at waist level and  lighter objects on lower or higher shelves.  Find ways to decrease your stress, such as exercise, massage, or relaxation techniques. Stress can build up in your muscles. Tense muscles are more vulnerable to injury.  Seek treatment for depression or anxiety if needed. These conditions can increase your risk of developing back pain. SEEK MEDICAL CARE IF:  You injure your back.  You have questions about diet, exercise, or other ways to prevent back injuries. MAKE SURE YOU:  Understand these instructions.  Will watch your condition.  Will get help right away if you are not doing well or get worse. Document Released: 05/07/2004 Document Revised: 06/22/2011 Document Reviewed: 05/11/2011 [facility] Patient Information 2015 [place], [place]. This information is not intended to replace advice given to you by your health care provider. Make sure you discuss any questions you have with your health care provider.   Back Exercises These exercises may help you when beginning to rehabilitate your injury. Your symptoms may resolve with or without further involvement from your physician, physical therapist or athletic trainer. While completing these exercises, remember:   Restoring tissue flexibility helps normal motion to return to the joints. This allows healthier, less painful movement and activity.  An effective stretch should be held for at least 30 seconds.  A stretch should never be painful. You should only feel a gentle lengthening or release in the stretched tissue. STRETCH - Extension, Prone on Elbows   Lie on your stomach on the floor, a bed will be too soft. Place your palms about shoulder width apart and at the height of your head.  Place your elbows under your shoulders. If this is too painful, stack pillows under your chest.  Allow your body to relax so that your hips drop lower and make contact more completely with the floor.  Hold this position for __________ seconds.  Slowly  return to lying flat on the floor. Repeat __________ times. Complete this exercise __________ times per day.  RANGE OF MOTION - Extension, Prone Press Ups   Lie on your stomach on the floor, a bed will be too soft. Place your palms about shoulder width apart and at the height of your head.  Keeping your back as relaxed as possible, slowly straighten your elbows while keeping your hips on the floor. You may adjust the placement of your hands to maximize your comfort.  As you gain motion, your hands will come more underneath your shoulders.  Hold this position __________ seconds.  Slowly return to lying flat on the floor. Repeat __________ times. Complete this exercise __________ times per day.  RANGE OF MOTION- Quadruped, Neutral Spine   Assume a hands and knees position on a firm surface. Keep your hands under your shoulders and your knees under your hips. You may place padding under your knees for comfort.  Drop your head and point your tail bone toward the ground below you. This will round out your low back like an angry cat. Hold this position for __________ seconds.  Slowly lift your head and release your tail bone so that your back sags into a large arch, like an old horse.  Hold this position for __________ seconds.  Repeat this until you feel limber in your low back.  Now, find your "sweet spot." This will be the most comfortable position somewhere between the two previous positions. This is your neutral spine. Once you have found this position, tense your stomach muscles to support your low back.  Hold this position for __________ seconds. Repeat __________ times. Complete this exercise __________ times per day.  STRETCH - Flexion, Single Knee to Chest   Lie on a firm bed or floor with both legs extended in front of you.  Keeping one leg in contact with the floor, bring your opposite knee to your chest. Hold your leg in place by either grabbing behind your thigh or at your  knee.  Pull until you feel a gentle stretch in your low back. Hold __________ seconds.  Slowly release your grasp and repeat the exercise with the opposite side. Repeat __________ times. Complete this exercise __________ times per day.  STRETCH - Hamstrings, Standing  Stand or sit and extend your right / left leg, placing your foot on a chair or foot stool  Keeping a slight arch in your low back and your hips straight forward.  Lead with your chest and lean forward at the waist until you feel a gentle stretch in the back of your right / left knee or thigh. (When done correctly, this exercise requires leaning only a small distance.)  Hold this position for __________ seconds. Repeat __________ times. Complete this stretch __________ times per day. STRENGTHENING - Deep Abdominals, Pelvic Tilt   Lie on a firm bed or floor. Keeping your legs in front of you, bend your knees so they are both pointed toward the ceiling and your feet are flat on the floor.  Tense your lower abdominal muscles to press your low back into the floor. This motion will rotate your pelvis so that your tail bone is scooping upwards rather than pointing at your feet or into the floor.  With a gentle tension and even breathing, hold this position for __________ seconds. Repeat __________ times. Complete this exercise __________ times per day.  STRENGTHENING - Abdominals, Crunches   Lie on a firm bed or floor. Keeping your legs in front of you, bend your knees so they are both pointed toward the ceiling and your feet are flat on the floor. Cross your arms over your chest.  Slightly tip your chin down without bending your neck.  Tense your abdominals and slowly lift your trunk high enough to just clear your shoulder blades. Lifting higher can put excessive stress on the low back and does not further strengthen your abdominal muscles.  Control your return to the starting position. Repeat __________ times.  Complete this  exercise __________ times per day.  STRENGTHENING - Quadruped, Opposite UE/LE Lift   Assume a hands and knees position on a firm surface. Keep your hands under your shoulders and your knees under your hips. You may place padding under your knees for comfort.  Find your neutral spine and gently tense your abdominal muscles so that you can maintain this position. Your shoulders and hips should form a rectangle that is parallel with the floor and is not twisted.  Keeping your trunk steady, lift your right hand no higher than your shoulder and then your left leg no higher than your hip. Make sure you are not holding your breath. Hold this position __________ seconds.  Continuing to keep your abdominal muscles tense and your back steady, slowly return to your starting position. Repeat with the opposite arm and leg. Repeat __________ times. Complete this exercise __________ times per day. Document Released: 04/17/2005 Document Revised: 06/22/2011 Document Reviewed: 07/12/2008 [facility] Patient Information 2015 [place], [place]. This information is not intended to replace advice given to you by your health care provider. Make sure you discuss any questions you have with your health care provider.

## 2014-02-03 LAB — URINE CULTURE: Colony Count: 50000

## 2014-02-05 ENCOUNTER — Encounter: Payer: Self-pay | Admitting: Family Medicine

## 2014-02-05 MED ORDER — FLUCONAZOLE 150 MG PO TABS: 150.0000 mg | ORAL_TABLET | Freq: Once | ORAL | Status: DC

## 2014-02-08 ENCOUNTER — Ambulatory Visit (INDEPENDENT_AMBULATORY_CARE_PROVIDER_SITE_OTHER): Payer: 59

## 2014-02-08 ENCOUNTER — Ambulatory Visit (INDEPENDENT_AMBULATORY_CARE_PROVIDER_SITE_OTHER): Payer: 59 | Admitting: Family Medicine

## 2014-02-08 VITALS — BP 118/80 | HR 97 | Temp 98.4°F | Resp 18 | Ht 61.25 in | Wt 131.0 lb

## 2014-02-08 DIAGNOSIS — R109 Unspecified abdominal pain: Secondary | ICD-10-CM

## 2014-02-08 DIAGNOSIS — M62830 Muscle spasm of back: Secondary | ICD-10-CM

## 2014-02-08 DIAGNOSIS — R8271 Bacteriuria: Secondary | ICD-10-CM

## 2014-02-08 DIAGNOSIS — N390 Urinary tract infection, site not specified: Secondary | ICD-10-CM

## 2014-02-08 LAB — POCT URINALYSIS DIPSTICK
BILIRUBIN UA: NEGATIVE
Blood, UA: NEGATIVE
GLUCOSE UA: NEGATIVE
Ketones, UA: NEGATIVE
LEUKOCYTES UA: NEGATIVE
Nitrite, UA: NEGATIVE
Protein, UA: NEGATIVE
SPEC GRAV UA: 1.01
Urobilinogen, UA: 0.2
pH, UA: 6

## 2014-02-08 LAB — POCT UA - MICROSCOPIC ONLY
Bacteria, U Microscopic: NEGATIVE
Casts, Ur, LPF, POC: NEGATIVE
Crystals, Ur, HPF, POC: NEGATIVE
MUCUS UA: NEGATIVE
RBC, URINE, MICROSCOPIC: NEGATIVE
WBC, Ur, HPF, POC: NEGATIVE
Yeast, UA: NEGATIVE

## 2014-02-08 LAB — POCT URINE PREGNANCY: Preg Test, Ur: NEGATIVE

## 2014-02-08 MED ORDER — CYCLOBENZAPRINE HCL 10 MG PO TABS: 10.0000 mg | ORAL_TABLET | Freq: Two times a day (BID) | ORAL | Status: DC | PRN

## 2014-02-08 NOTE — Progress Notes (Addendum)
[facility] [address], [place] [address] [phone]  Date:  02/08/2014   Name:  Shelly [name]   DOB:  10-07-1985   MRN:  [id number]  PCP:  No PCP Per Patient    Chief Complaint: Follow-up   History of Present Illness:  Shelly [name] is a 28 y.o. very pleasant female patient who presents with the following:  Here today to recheck UTI type sx.  Saw Dr. Brigitte Pulse on 10/10 and 10/23 and noted to have lower back pain.  Her urine culture from 10/10 showed low growth GBS, 10/23 showed mixed bacteria so she was asked to come back for a recheck. She has used the robaxin which did help some, but her back still does hurt.  She had also tried some flexeril which seemed to be helpful She did injury her back earlier this year and has noted some pain on and off since.  However this more acute pain started after prolonged sitting at a football game.   She continues to have tenderness of the right lower back muscles, but numbness or weakness in her leg.  Occasionallly the pain will "shoot down" her right leg for a moment but will not remain there.   No isues with bowel or bladder control  Patient Active Problem List   Diagnosis Date Noted  . Abdominal pain, chronic, epigastric 02/15/2013  . Change in bowel habits 02/15/2013  . Bloating 02/15/2013  . Anemia, iron deficiency 02/15/2013  . Secondary female infertility 04/21/2012  . Endometriosis of pelvis 04/21/2012    Past Medical History  Diagnosis Date  . PONV (postoperative nausea and vomiting)   . Endometriosis of pelvis 04/21/2012    Diagnosed on pathology after operative laparoscopy on 04/21/2012   . Anxiety     Past Surgical History  Procedure Laterality Date  . Cesarean section  09/17/00, 08/31/02, 06/14/08    x 3  . Laparoscopy  04/21/2012    Procedure: LAPAROSCOPY OPERATIVE;  Surgeon: Osborne Oman, MD;  Location: [facility];  Service: Gynecology;  Laterality: N/A;  Peritoneal biopsies  . Chromopertubation  04/21/2012     Procedure: CHROMOPERTUBATION;  Surgeon: Osborne Oman, MD;  Location: [facility];  Service: Gynecology;  Laterality: N/A;  . Laparoscopic lysis of adhesions  04/21/2012    Procedure: LAPAROSCOPIC LYSIS OF ADHESIONS;  Surgeon: Osborne Oman, MD;  Location: [facility];  Service: Gynecology;  Laterality: N/A;    History  Substance Use Topics  . Smoking status: Never Smoker   . Smokeless tobacco: Never Used  . Alcohol Use: No    Family History  Problem Relation Age of Onset  . Breast cancer Mother   . Breast cancer Paternal Aunt   . Colon cancer Other     greatgrandfather and great uncle    No Known Allergies  Medication list has been reviewed and updated.  Current Outpatient Prescriptions on File Prior to Visit  Medication Sig Dispense Refill  . albuterol (PROVENTIL HFA;VENTOLIN HFA) 108 (90 BASE) MCG/ACT inhaler Inhale 2 puffs into the lungs every 4 (four) hours as needed for wheezing or shortness of breath (cough, shortness of breath or wheezing.).  1 Inhaler  3  . fluconazole (DIFLUCAN) 150 MG tablet Take 1 tablet (150 mg total) by mouth once.  1 tablet  0  . nabumetone (RELAFEN) 750 MG tablet Take 1 tablet (750 mg total) by mouth 2 (two) times daily as needed for moderate pain.  60 tablet  0  No current facility-administered medications on file prior to visit.    Review of Systems:  As per HPI- otherwise negative.   Physical Examination: Filed Vitals:   02/08/14 1459  BP: 118/80  Pulse: 97  Temp: 98.4 F (36.9 C)  Resp: 18   Filed Vitals:   02/08/14 1459  Height: 5' 1.25" (1.556 m)  Weight: 131 lb (59.421 kg)   Body mass index is 24.54 kg/(m^2). Ideal Body Weight: Weight in (lb) to have BMI = 25: 133.1  GEN: WDWN, NAD, Non-toxic, A & O x 3, looks well HEENT: Atraumatic, Normocephalic. Neck supple. No masses, No LAD. Ears and Nose: No external deformity. CV: RRR, No M/G/R. No JVD. No thrill. No extra heart sounds. PULM: CTA B, no wheezes, crackles, rhonchi.  No retractions. No resp. distress. No accessory muscle use. ABD: S, NT, ND EXTR: No c/c/e NEURO Normal gait.  PSYCH: Normally interactive. Conversant. Not depressed or anxious appearing.  Calm demeanor.  Mild tenderness of right sided lumbar muscles, normal flexion and extension of her back, normal BLE strength, sensation and DTR   [facility] reading (PRIMARY) by  Dr. Lorelei Pont. Lumbar spine: negative except for loss of lordosis  LUMBAR SPINE - COMPLETE 4+ VIEW  COMPARISON: 01/20/2014  FINDINGS: Normal lumbar alignment. Negative for fracture or mass. No pars defect. Disc spaces are maintained  3 mm left lower pole renal calculus unchanged from the prior study.  IMPRESSION: Normal lumbar spine  3 mm left renal calculus.  Results for orders placed in visit on 02/08/14  POCT URINALYSIS DIPSTICK      Result Value Ref Range   Color, UA Yellow     Clarity, UA Clear     Glucose, UA Neg     Bilirubin, UA Neg     Ketones, UA Neg     Spec Grav, UA 1.010     Blood, UA Neg     pH, UA 6.0     Protein, UA Neg     Urobilinogen, UA 0.2     Nitrite, UA Neg     Leukocytes, UA Negative    POCT UA - MICROSCOPIC ONLY      Result Value Ref Range   WBC, Ur, HPF, POC Neg     RBC, urine, microscopic Neg     Bacteria, U Microscopic Neg     Mucus, UA Neg     Epithelial cells, urine per micros 3-8     Crystals, Ur, HPF, POC Neg     Casts, Ur, LPF, POC Neg     Yeast, UA Neg    POCT URINE PREGNANCY      Result Value Ref Range   Preg Test, Ur Negative      Assessment and Plan: Back spasm - Plan: cyclobenzaprine (FLEXERIL) 10 MG tablet  Right flank pain - Plan: POCT urinalysis dipstick, POCT UA - Microscopic Only, POCT urine pregnancy, Urine culture, DG Lumbar Spine Complete  Reassured that her urine certainly looks fine. Await culture but do not think this is UTI/ pyelo.   rx flexeril for use as needed for MSK back pain.   Follow-up if not better    Signed Lamar Blinks, MD  Called to  discuss her culture- she does have GBS still in her urine.  She reports that her sx are reduced but not yet gone. Will try treating her again with amoxicillin.  She will let us know if sx not resolved

## 2014-02-08 NOTE — Patient Instructions (Signed)
I do not think this is a kidney or UTI problem .  Use the flexeril as needed- I would take it nightly for 5-7 days.  Let me know if you are not doing better soon- in that case we may want to send you to PT

## 2014-02-10 LAB — URINE CULTURE: Colony Count: 50000

## 2014-02-13 ENCOUNTER — Encounter: Payer: Self-pay | Admitting: Family Medicine

## 2014-02-13 MED ORDER — AMOXICILLIN 875 MG PO TABS: 875.0000 mg | ORAL_TABLET | Freq: Two times a day (BID) | ORAL | Status: DC

## 2014-02-13 NOTE — Addendum Note (Signed)
Addended by: Lamar Blinks C on: 02/13/2014 04:55 PM   Modules accepted: Orders

## 2014-03-13 ENCOUNTER — Ambulatory Visit (INDEPENDENT_AMBULATORY_CARE_PROVIDER_SITE_OTHER): Payer: 59 | Admitting: Internal Medicine

## 2014-03-13 VITALS — BP 108/70 | HR 106 | Temp 99.3°F | Resp 18 | Ht 61.0 in | Wt 134.0 lb

## 2014-03-13 DIAGNOSIS — M79661 Pain in right lower leg: Secondary | ICD-10-CM

## 2014-03-13 DIAGNOSIS — M7989 Other specified soft tissue disorders: Secondary | ICD-10-CM

## 2014-03-13 MED ORDER — PREDNISONE 20 MG PO TABS: ORAL_TABLET | ORAL | Status: DC

## 2014-03-13 NOTE — Progress Notes (Signed)
Subjective:  This chart was scribed for Shelly Koyanagi, MD by Ladene Artist, ED Scribe. The patient was seen in room 2. Patient's care was started at 6:47 PM.   Patient ID: Shelly [name], female    DOB: 1986-04-02, 28 y.o.   MRN: [id number]  Chief Complaint  Patient presents with  . Leg Pain    rt since yesterday    HPI  HPI Comments: Shelly [name] is a 28 y.o. female, with a h/o anxiety and endometriosis of pelvis, who presents to the [facility] complaining of constant R lower leg pain onset yesterday. Pt initially noted the pain while lying down. No known injury. She describes the pain as a cramping and aching sensation that is exacerbated with sitting for extended periods of time. Pt reports associated numbness in her R lower extremity when she initially stands. She also reports associated R hip and R thigh pain that she describes as random. She reports h/o back pain over the past few years. Pt also reports sweating upon waking, nausea and HA after taking a Flexeril tablet earlier today. She denies chills or known fever.   Pt denies exercising recently. 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  Pt reports family h/o blood clot and brain aneurysm; grandparents. Pt is not currently taking BC pills.   Past Medical History  Diagnosis Date  . PONV (postoperative nausea and vomiting)   . Endometriosis of pelvis 04/21/2012    Diagnosed on pathology after operative laparoscopy on 04/21/2012   . Anxiety    Current Outpatient Prescriptions on File Prior to Visit  Medication Sig Dispense Refill  . albuterol (PROVENTIL HFA;VENTOLIN HFA) 108 (90 BASE) MCG/ACT inhaler Inhale 2 puffs into the lungs every 4 (four) hours as needed for wheezing or shortness of breath (cough, shortness of breath or wheezing.). 1 Inhaler 3  . cyclobenzaprine (FLEXERIL) 10 MG tablet Take 1 tablet (10 mg total) by mouth 2 (two) times  daily as needed for muscle spasms. 30 tablet 0  . amoxicillin (AMOXIL) 875 MG tablet Take 1 tablet (875 mg total) by mouth 2 (two) times daily. (Patient not taking: Reported on 03/13/2014) 14 tablet 0  . fluconazole (DIFLUCAN) 150 MG tablet Take 1 tablet (150 mg total) by mouth once. (Patient not taking: Reported on 03/13/2014) 1 tablet 0  . nabumetone (RELAFEN) 750 MG tablet Take 1 tablet (750 mg total) by mouth 2 (two) times daily as needed for moderate pain. (Patient not taking: Reported on 03/13/2014) 60 tablet 0   No current facility-administered medications on file prior to visit.   No Known Allergies  Review of Systems  Constitutional: Positive for diaphoresis. Negative for fever and chills.  Gastrointestinal: Positive for nausea.  Musculoskeletal: Positive for myalgias.  Neurological: Positive for numbness and headaches.      Objective:   Physical Exam  Constitutional: She is oriented to person, place, and time. She appears well-developed and well-nourished. No distress.  HENT:  Head: Normocephalic and atraumatic.  Eyes: Conjunctivae and EOM are normal.  Neck: Neck supple.  Cardiovascular: Normal rate.   Pulmonary/Chest: Effort normal. No respiratory distress.  Musculoskeletal: Normal range of motion.  Tender to palpation over the R lumbar area, R buttock and R posterior and lateral calf. Calf pain is worse with dorsiflexion of the foot. No swelling in calf. No palpable mass or defect. Very tender to palpation without redness or bruising. Straight  leg raise to 90 degrees in negative.  Neurological: She is alert and oriented to person, place, and time. She has normal reflexes. No sensory deficit.  No sensory losses in foot  Skin: Skin is warm and dry.  Psychiatric: She has a normal mood and affect. Her behavior is normal.  Nursing note and vitals reviewed.     Assessment & Plan:  Right calf pain in a sedentary person-positive Homans We'll schedule Doppler to rule out  DVT Restart Relafen use hot compresses or heating pad    I personally performed the services described in this documentation, which was scribed in my presence. The recorded information has been reviewed and is accurate.

## 2014-03-14 ENCOUNTER — Ambulatory Visit (HOSPITAL_COMMUNITY)
Admission: RE | Admit: 2014-03-14 | Discharge: 2014-03-14 | Disposition: A | Discharge status: Home / Self Care | Payer: 59 | Source: Ambulatory Visit | Attending: Internal Medicine | Admitting: Internal Medicine

## 2014-03-14 ENCOUNTER — Telehealth: Payer: Self-pay | Admitting: *Deleted

## 2014-03-14 DIAGNOSIS — M79661 Pain in right lower leg: Secondary | ICD-10-CM | POA: Insufficient documentation

## 2014-03-14 NOTE — Progress Notes (Signed)
Right lower extremity venous duplex completed.  Right:  No evidence of DVT, superficial thrombosis, or Baker's cyst.  Left:  Negative for DVT in the common femoral vein.  

## 2014-03-14 NOTE — Telephone Encounter (Signed)
Let her know no blood clot!!! Use treatment as described

## 2014-03-15 NOTE — Telephone Encounter (Signed)
Pt advised of results. 

## 2014-04-10 ENCOUNTER — Encounter: Payer: Self-pay | Admitting: Family Medicine

## 2014-04-20 ENCOUNTER — Emergency Department (HOSPITAL_COMMUNITY)
Admission: EM | Admit: 2014-04-20 | Discharge: 2014-04-21 | Disposition: A | Discharge status: Home / Self Care | Payer: 59 | Attending: Emergency Medicine | Admitting: Emergency Medicine

## 2014-04-20 ENCOUNTER — Ambulatory Visit (INDEPENDENT_AMBULATORY_CARE_PROVIDER_SITE_OTHER): Payer: 59 | Admitting: Family Medicine

## 2014-04-20 ENCOUNTER — Encounter (HOSPITAL_COMMUNITY): Payer: Self-pay | Admitting: Emergency Medicine

## 2014-04-20 VITALS — BP 110/72 | HR 132 | Temp 98.4°F | Resp 18 | Ht 62.0 in | Wt 135.0 lb

## 2014-04-20 DIAGNOSIS — R519 Headache, unspecified: Secondary | ICD-10-CM

## 2014-04-20 DIAGNOSIS — Z79899 Other long term (current) drug therapy: Secondary | ICD-10-CM | POA: Insufficient documentation

## 2014-04-20 DIAGNOSIS — R509 Fever, unspecified: Secondary | ICD-10-CM | POA: Insufficient documentation

## 2014-04-20 DIAGNOSIS — R51 Headache: Secondary | ICD-10-CM | POA: Insufficient documentation

## 2014-04-20 DIAGNOSIS — Z8742 Personal history of other diseases of the female genital tract: Secondary | ICD-10-CM | POA: Diagnosis not present

## 2014-04-20 DIAGNOSIS — Z8659 Personal history of other mental and behavioral disorders: Secondary | ICD-10-CM | POA: Insufficient documentation

## 2014-04-20 DIAGNOSIS — Z3202 Encounter for pregnancy test, result negative: Secondary | ICD-10-CM | POA: Insufficient documentation

## 2014-04-20 DIAGNOSIS — M542 Cervicalgia: Secondary | ICD-10-CM | POA: Diagnosis not present

## 2014-04-20 DIAGNOSIS — R000 Tachycardia, unspecified: Secondary | ICD-10-CM

## 2014-04-20 DIAGNOSIS — M436 Torticollis: Secondary | ICD-10-CM

## 2014-04-20 LAB — CSF CELL COUNT WITH DIFFERENTIAL
RBC COUNT CSF: 1 /mm3 — AB
RBC Count, CSF: 489 /mm3 — ABNORMAL HIGH
Tube #: 1
Tube #: 4
WBC CSF: 0 /mm3 (ref 0–5)
WBC, CSF: 2 /mm3 (ref 0–5)

## 2014-04-20 LAB — CBC WITH DIFFERENTIAL/PLATELET
Basophils Absolute: 0 10*3/uL (ref 0.0–0.1)
Basophils Relative: 0 % (ref 0–1)
EOS ABS: 0.1 10*3/uL (ref 0.0–0.7)
Eosinophils Relative: 1 % (ref 0–5)
HCT: 36.4 % (ref 36.0–46.0)
HEMOGLOBIN: 11.9 g/dL — AB (ref 12.0–15.0)
LYMPHS ABS: 1.5 10*3/uL (ref 0.7–4.0)
Lymphocytes Relative: 19 % (ref 12–46)
MCH: 26.4 pg (ref 26.0–34.0)
MCHC: 32.7 g/dL (ref 30.0–36.0)
MCV: 80.9 fL (ref 78.0–100.0)
MONO ABS: 0.6 10*3/uL (ref 0.1–1.0)
MONOS PCT: 7 % (ref 3–12)
NEUTROS ABS: 5.7 10*3/uL (ref 1.7–7.7)
NEUTROS PCT: 73 % (ref 43–77)
Platelets: 255 10*3/uL (ref 150–400)
RBC: 4.5 MIL/uL (ref 3.87–5.11)
RDW: 13.2 % (ref 11.5–15.5)
WBC: 7.8 10*3/uL (ref 4.0–10.5)

## 2014-04-20 LAB — POCT CBC
Granulocyte percent: 83 %G — AB (ref 37–80)
HCT, POC: 36.9 % — AB (ref 37.7–47.9)
HEMOGLOBIN: 12.1 g/dL — AB (ref 12.2–16.2)
Lymph, poc: 1.1 (ref 0.6–3.4)
MCH, POC: 27.1 pg (ref 27–31.2)
MCHC: 32.7 g/dL (ref 31.8–35.4)
MCV: 82.9 fL (ref 80–97)
MID (CBC): 0.5 (ref 0–0.9)
MPV: 6.3 fL (ref 0–99.8)
POC Granulocyte: 7.8 — AB (ref 2–6.9)
POC LYMPH PERCENT: 11.5 %L (ref 10–50)
POC MID %: 5.5 %M (ref 0–12)
Platelet Count, POC: 306 10*3/uL (ref 142–424)
RBC: 4.45 M/uL (ref 4.04–5.48)
RDW, POC: 14.1 %
WBC: 9.4 10*3/uL (ref 4.6–10.2)

## 2014-04-20 LAB — GRAM STAIN

## 2014-04-20 LAB — PROTEIN, CSF: Total  Protein, CSF: 24 mg/dL (ref 15–45)

## 2014-04-20 LAB — POC URINE PREG, ED: PREG TEST UR: NEGATIVE

## 2014-04-20 LAB — GLUCOSE, CSF: Glucose, CSF: 53 mg/dL (ref 43–76)

## 2014-04-20 MED ORDER — ONDANSETRON HCL 4 MG/2ML IJ SOLN
4.0000 mg | Freq: Once | INTRAMUSCULAR | Status: AC
  Administered 2014-04-20: 4 mg via INTRAVENOUS
  Filled 2014-04-20: qty 2

## 2014-04-20 MED ORDER — DEXAMETHASONE SODIUM PHOSPHATE 10 MG/ML IJ SOLN
10.0000 mg | Freq: Once | INTRAMUSCULAR | Status: AC
  Administered 2014-04-20: 10 mg via INTRAVENOUS
  Filled 2014-04-20: qty 1

## 2014-04-20 MED ORDER — DIPHENHYDRAMINE HCL 50 MG/ML IJ SOLN
25.0000 mg | Freq: Once | INTRAMUSCULAR | Status: AC
  Administered 2014-04-20: 25 mg via INTRAVENOUS
  Filled 2014-04-20: qty 1

## 2014-04-20 MED ORDER — SODIUM CHLORIDE 0.9 % IV BOLUS (SEPSIS)
1000.0000 mL | Freq: Once | INTRAVENOUS | Status: AC
  Administered 2014-04-20: 1000 mL via INTRAVENOUS

## 2014-04-20 MED ORDER — PROCHLORPERAZINE EDISYLATE 5 MG/ML IJ SOLN: 10.0000 mg | Freq: Four times a day (QID) | INTRAMUSCULAR | Status: DC | PRN

## 2014-04-20 MED ORDER — FENTANYL CITRATE 0.05 MG/ML IJ SOLN
50.0000 ug | INTRAMUSCULAR | Status: DC | PRN
  Administered 2014-04-20 (×2): 50 ug via INTRAVENOUS
  Filled 2014-04-20 (×2): qty 2

## 2014-04-20 NOTE — Patient Instructions (Addendum)
Please proceed to the [facility] ER.  I am concerned that you could have meningitis (likely viral)- however we need to mae sure that you do not need any more urgent treatment.  Please let them know when you register that we are concerned about meningitis  Call me if you need anything further!

## 2014-04-20 NOTE — ED Provider Notes (Signed)
Patient seen and evaluated. Care discussed with Dr. Rolene Arbour.  Patient examined. She has some limitation of range of motion of her neck both to flex and extend, as well as to rotate. Minimal reproducible pain to palpation of the midline neck. Afebrile here. Normal blood cell count. However, has a resting tachycardia.  Negative Kernig's. No rash. Does not appear septic toxic. However she reports headache, malaise, and mild nausea. Does not have headache. I discussed risks and benefits of performing versus not performing spinal tap. Benefit would include confirming or refuting the diagnosis of meningitis, and determining etiology of bacterial versus aseptic if present.  We discussed complications including an infection, spinal headache, failure to obtain fluid. I discussed the procedure itself. Discussed being supine for the next 24 hours and she is admitted or able to go home. She consents to all of the above.  Tanna Furry, MD 04/20/14 [phone]

## 2014-04-20 NOTE — Progress Notes (Signed)
[facility] [address], [place] [address] [phone]  Date:  04/20/2014   Name:  Shelly [name]   DOB:  1985/08/24   MRN:  [id number]  PCP:  No PCP Per Patient    Chief Complaint: Neck Pain   History of Present Illness:  Shelly [name] is a 29 y.o. very pleasant female patient who presents with the following:  She had lower back pain a few months ago.  However yesterday am she noted onset of pain in her neck. She is not sure if this is just tight muscles again or if it somehting different.  She cannot look down, and also now noted pain "on my stomach on both sides."  She first noted this stiff neck when she woke up yesterday am  She does "not really" have a headache. She does not have any vomiting or nausea She was not aware of having a fever.    Patient Active Problem List   Diagnosis Date Noted  . Abdominal pain, chronic, epigastric 02/15/2013  . Change in bowel habits 02/15/2013  . Bloating 02/15/2013  . Anemia, iron deficiency 02/15/2013  . Secondary female infertility 04/21/2012  . Endometriosis of pelvis 04/21/2012    Past Medical History  Diagnosis Date  . PONV (postoperative nausea and vomiting)   . Endometriosis of pelvis 04/21/2012    Diagnosed on pathology after operative laparoscopy on 04/21/2012   . Anxiety     Past Surgical History  Procedure Laterality Date  . Cesarean section  09/17/00, 08/31/02, 06/14/08    x 3  . Laparoscopy  04/21/2012    Procedure: LAPAROSCOPY OPERATIVE;  Surgeon: Osborne Oman, MD;  Location: [facility];  Service: Gynecology;  Laterality: N/A;  Peritoneal biopsies  . Chromopertubation  04/21/2012    Procedure: CHROMOPERTUBATION;  Surgeon: Osborne Oman, MD;  Location: [facility];  Service: Gynecology;  Laterality: N/A;  . Laparoscopic lysis of adhesions  04/21/2012    Procedure: LAPAROSCOPIC LYSIS OF ADHESIONS;  Surgeon: Osborne Oman, MD;  Location: [facility];  Service: Gynecology;  Laterality: N/A;    History   Substance Use Topics  . Smoking status: Never Smoker   . Smokeless tobacco: Never Used  . Alcohol Use: No    Family History  Problem Relation Age of Onset  . Breast cancer Mother   . Breast cancer Paternal Aunt   . Colon cancer Other     greatgrandfather and great uncle    No Known Allergies  Medication list has been reviewed and updated.  Current Outpatient Prescriptions on File Prior to Visit  Medication Sig Dispense Refill  . albuterol (PROVENTIL HFA;VENTOLIN HFA) 108 (90 BASE) MCG/ACT inhaler Inhale 2 puffs into the lungs every 4 (four) hours as needed for wheezing or shortness of breath (cough, shortness of breath or wheezing.). 1 Inhaler 3  . cyclobenzaprine (FLEXERIL) 10 MG tablet Take 1 tablet (10 mg total) by mouth 2 (two) times daily as needed for muscle spasms. 30 tablet 0  . amoxicillin (AMOXIL) 875 MG tablet Take 1 tablet (875 mg total) by mouth 2 (two) times daily. (Patient not taking: Reported on 03/13/2014) 14 tablet 0  . fluconazole (DIFLUCAN) 150 MG tablet Take 1 tablet (150 mg total) by mouth once. (Patient not taking: Reported on 03/13/2014) 1 tablet 0  . nabumetone (RELAFEN) 750 MG tablet Take 1 tablet (750 mg total) by mouth 2 (two) times daily as needed for moderate pain. (Patient not taking: Reported on 03/13/2014) 60  tablet 0  . predniSONE (DELTASONE) 20 MG tablet 3/3/2/2/1/1 single daily dose for 6 days (Patient not taking: Reported on 04/20/2014) 12 tablet 0   No current facility-administered medications on file prior to visit.    Review of Systems:  As per HPI- otherwise negative.   Physical Examination: Filed Vitals:   04/20/14 1340  BP: 110/72  Pulse: 132  Temp: 100 F (37.8 C)  Resp: 18   Filed Vitals:   04/20/14 1340  Height: 5\' 2"  (1.575 m)  Weight: 135 lb (61.236 kg)   Body mass index is 24.69 kg/(m^2). Ideal Body Weight: Weight in (lb) to have BMI = 25: 136.4  GEN: WDWN, NAD, Non-toxic, A & O x 3 She looks well but holds herself  with her neck stiff, looking straight ahead.  She is able to rotate her neck to 45 degrees bilaterally but cannot flex more than about 50% of normal HEENT: Atraumatic, Normocephalic. No masses, No LAD. Bilateral TM wnl, oropharynx normal.  PEERL,EOMI.   Ears and Nose: No external deformity. CV: RRR- tachycardic, No M/G/R. No JVD. No thrill. No extra heart sounds. PULM: CTA B, no wheezes, crackles, rhonchi. No retractions. No resp. distress. No accessory muscle use. ABD: ND, +BS. No rebound. No HSM.  Her belly is soft but she endorses mild tenderness throughout  EXTR: No c/c/e NEURO Normal gait.  PSYCH: Normally interactive. Conversant. Not depressed or anxious appearing.  Calm demeanor.   Results for orders placed or performed in visit on 04/20/14  POCT CBC  Result Value Ref Range   WBC 9.4 4.6 - 10.2 K/uL   Lymph, poc 1.1 0.6 - 3.4   POC LYMPH PERCENT 11.5 10 - 50 %L   MID (cbc) 0.5 0 - 0.9   POC MID % 5.5 0 - 12 %M   POC Granulocyte 7.8 (A) 2 - 6.9   Granulocyte percent 83.0 (A) 37 - 80 %G   RBC 4.45 4.04 - 5.48 M/uL   Hemoglobin 12.1 (A) 12.2 - 16.2 g/dL   HCT, POC 36.9 (A) 37.7 - 47.9 %   MCV 82.9 80 - 97 fL   MCH, POC 27.1 27 - 31.2 pg   MCHC 32.7 31.8 - 35.4 g/dL   RDW, POC 14.1 %   Platelet Count, POC 306 142 - 424 K/uL   MPV 6.3 0 - 99.8 fL   Assessment and Plan: Stiff neck - Plan: POCT CBC  Tachycardia - Plan: POCT CBC  Here today with stiff neck and tachycardia.  She does not appear toxic but she is tachycardic and her neck is stiff to exam.  I am concerned that she may have meningitis (likley viral given how well she appear currently).  Her husband came to clinic to pick her up, he will take her to the [facility] ED.   Called and alerted charge nurse as to my concerns.   Signed Lamar Blinks, MD

## 2014-04-20 NOTE — ED Notes (Signed)
Consent at bedside.  

## 2014-04-20 NOTE — ED Notes (Signed)
Pt reports neck pain since last night and generalized abdominal pain.  Last BM Wednesday normal.  Pt was seen at [facility] today and was told to come to [facility].   Pt alert and oriented. Limited movement in neck.

## 2014-04-20 NOTE — ED Provider Notes (Signed)
CSN: [id number]     Arrival date & time 04/20/14  1442 History   First MD Initiated Contact with Patient 04/20/14 1750     Chief Complaint  Patient presents with  . Headache  . Neck Pain     (Consider location/radiation/quality/duration/timing/severity/associated sxs/prior Treatment) Patient is a 29 y.o. female presenting with headaches. The history is provided by the patient.  Headache Pain location:  Occipital Quality:  Dull Radiates to:  Does not radiate Severity currently:  8/10 Severity at highest:  10/10 Onset quality:  Gradual Duration:  2 days Timing:  Constant Progression:  Worsening Chronicity:  New Similar to prior headaches: no   Relieved by:  Nothing Worsened by:  Neck movement Ineffective treatments:  None tried Associated symptoms: fever (subjective fevers, borderline temps of 100.0), neck pain and neck stiffness   Associated symptoms: no abdominal pain, no back pain, no cough, no dizziness, no pain, no fatigue, no myalgias, no nausea, no photophobia, no sore throat and no vomiting   Risk factors: no anger and no family hx of [place]     Past Medical History  Diagnosis Date  . PONV (postoperative nausea and vomiting)   . Endometriosis of pelvis 04/21/2012    Diagnosed on pathology after operative laparoscopy on 04/21/2012   . Anxiety    Past Surgical History  Procedure Laterality Date  . Cesarean section  09/17/00, 08/31/02, 06/14/08    x 3  . Laparoscopy  04/21/2012    Procedure: LAPAROSCOPY OPERATIVE;  Surgeon: Osborne Oman, MD;  Location: [place] [facility];  Service: Gynecology;  Laterality: N/A;  Peritoneal biopsies  . Chromopertubation  04/21/2012    Procedure: CHROMOPERTUBATION;  Surgeon: Osborne Oman, MD;  Location: [place] [facility];  Service: Gynecology;  Laterality: N/A;  . Laparoscopic lysis of adhesions  04/21/2012    Procedure: LAPAROSCOPIC LYSIS OF ADHESIONS;  Surgeon: Osborne Oman, MD;  Location: [facility];  Service: Gynecology;  Laterality: N/A;   Family History   Problem Relation Age of Onset  . Breast cancer Mother   . Breast cancer Paternal Aunt   . Colon cancer Other     greatgrandfather and great uncle   History  Substance Use Topics  . Smoking status: Never Smoker   . Smokeless tobacco: Never Used  . Alcohol Use: No   OB History    Gravida Para Term Preterm AB TAB SAB Ectopic Multiple Living   3 3 3  0 0 0 0 0 0 3     Review of Systems  Constitutional: Positive for fever (subjective fevers, borderline temps of 100.0). Negative for chills, diaphoresis, activity change, appetite change and fatigue.  HENT: Negative for facial swelling, rhinorrhea, sore throat, trouble swallowing and voice change.   Eyes: Negative for photophobia, pain and visual disturbance.  Respiratory: Negative for cough, shortness of breath, wheezing and stridor.   Cardiovascular: Negative for chest pain, palpitations and leg swelling.  Gastrointestinal: Negative for nausea, vomiting, abdominal pain, constipation and anal bleeding.  Endocrine: Negative.   Genitourinary: Negative for dysuria, vaginal bleeding, vaginal discharge and vaginal pain.  Musculoskeletal: Positive for neck pain and neck stiffness. Negative for myalgias, back pain and arthralgias.  Skin: Negative.  Negative for rash.  Allergic/Immunologic: Negative.   Neurological: Positive for headaches. Negative for dizziness, tremors, syncope and weakness.  Psychiatric/Behavioral: Negative for suicidal ideas, sleep disturbance and self-injury.  All other systems reviewed and are negative.     Allergies  Review of patient's allergies indicates no known allergies.  Home Medications  Prior to Admission medications   Medication Sig Start Date End Date Taking? Authorizing Provider  albuterol (PROVENTIL HFA;VENTOLIN HFA) 108 (90 BASE) MCG/ACT inhaler Inhale 2 puffs into the lungs every 4 (four) hours as needed for wheezing or shortness of breath (cough, shortness of breath or wheezing.). 10/01/13   Darlyne Russian, MD  cyclobenzaprine (FLEXERIL) 10 MG tablet Take 1 tablet (10 mg total) by mouth 2 (two) times daily as needed for muscle spasms. 02/08/14   Gay Filler Copland, MD   BP 107/64 mmHg  Pulse 97  Temp(Src) 98.7 F (37.1 C) (Oral)  Resp 25  Ht 5\' 1"  (1.549 m)  Wt 134 lb (60.782 kg)  BMI 25.33 kg/m2  SpO2 100%  LMP 03/15/2014 Physical Exam  Constitutional: She is oriented to person, place, and time. She appears well-developed and well-nourished. No distress.  HENT:  Head: Normocephalic and atraumatic.  Right Ear: External ear normal.  Left Ear: External ear normal.  Mouth/Throat: Oropharynx is clear and moist. No oropharyngeal exudate.  Eyes: Conjunctivae and EOM are normal. Pupils are equal, round, and reactive to light. No scleral icterus.  Neck: No JVD present. No tracheal deviation present. No thyromegaly present.  Neck painful to palpation. Patient has pain with movement of neck, specifically with looking down  Cardiovascular: Normal rate, regular rhythm and intact distal pulses.  Exam reveals no gallop and no friction rub.   No murmur heard. Pulmonary/Chest: Effort normal and breath sounds normal. No respiratory distress. She has no wheezes. She has no rales.  Abdominal: Soft. Bowel sounds are normal. She exhibits no distension. There is no tenderness.  Musculoskeletal: Normal range of motion. She exhibits no edema or tenderness.  Neurological: She is alert and oriented to person, place, and time. No cranial nerve deficit. She exhibits normal muscle tone. Coordination normal.  5/5 strength in all 4 extremities. Sensation intact and normal in all 4 extremities. Normal gait. Normal finger to nose and heel to shin. Negative romberg.   Skin: Skin is warm and dry. She is not diaphoretic. No pallor.  Psychiatric: She has a normal mood and affect. She expresses no homicidal and no suicidal ideation. She expresses no suicidal plans and no homicidal plans.  Nursing note and vitals  reviewed.   ED Course  LUMBAR PUNCTURE Date/Time: 04/20/2014 9:00 PM Performed by: Margaretann Loveless Authorized by: Tanna Furry Consent: Verbal consent obtained. Written consent obtained. Risks and benefits: risks, benefits and alternatives were discussed Consent given by: patient Patient understanding: patient states understanding of the procedure being performed Patient consent: the patient's understanding of the procedure matches consent given Patient identity confirmed: verbally with patient, arm band and hospital-assigned identification number Time out: Immediately prior to procedure a "time out" was called to verify the correct patient, procedure, equipment, support staff and site/side marked as required. Indications: evaluation for infection Anesthesia: local infiltration Local anesthetic: lidocaine 1% with epinephrine Anesthetic total: 5 ml Patient sedated: no Preparation: Patient was prepped and draped in the usual sterile fashion. Lumbar space: L4-L5 interspace Patient's position: sitting Needle gauge: 20 Needle type: diamond point Needle length: 3.5 in Number of attempts: 1 Fluid appearance: clear Tubes of fluid: 4 Total volume: 5 ml Post-procedure: site cleaned and adhesive bandage applied Patient tolerance: Patient tolerated the procedure well with no immediate complications   (including critical care time) Labs Review Labs Reviewed  CBC WITH DIFFERENTIAL - Abnormal; Notable for the following:    Hemoglobin 11.9 (*)    All other components within normal  limits  CSF CELL COUNT WITH DIFFERENTIAL - Abnormal; Notable for the following:    RBC Count, CSF 489 (*)    All other components within normal limits  CSF CELL COUNT WITH DIFFERENTIAL - Abnormal; Notable for the following:    RBC Count, CSF 1 (*)    All other components within normal limits  GRAM STAIN  CSF CULTURE  GLUCOSE, CSF  PROTEIN, CSF  HERPES SIMPLEX VIRUS(HSV) DNA BY PCR  POC URINE PREG, ED     Imaging Review No results found.   EKG Interpretation None      MDM   Final diagnoses:  Occipital headache  Neck pain    The patient is a 29 year old female who is sent to the ED from urgent care for rule out of meningitis after she presented for 2 days of occipital headache and neck pain. Patient is noted to be tachycardic to 1:30 and a borderline temperature of 100.0 Fahrenheit. Exam as above. Lumbar puncture performed as above and is not indicative of infection or bleed. Remainder of lab work is unremarkable. Do not suspect acute pathology until patient is appropriate for discharge home with symptomatic treatment as well as primary care doctor follow-up. Patient is given instructions to establish care with a primary care doctor. Patient expresses understanding and agreement with this plan and is discharged home.  I estimate there is LOW risk for SUBARACHNOID HEMORRHAGE, MENINGITIS, INTRACRANIAL HEMORRHAGE, or SUBDURAL HEMATOMA for the patient's symptoms thus I consider the discharge disposition reasonable. We have discussed the diagnosis and risks, and we agree with discharging home to follow-up with their primary doctor. We also discussed returning to the Emergency Department immediately if new or worsening symptoms occur. We have discussed the symptoms which are most concerning (e.g., changing or worsening pain, weakness, vomiting, vision/hearing changes, fever) that necessitate immediate return.  Patient seen with attending, Dr. Jeneen Rinks, who oversaw clinical decision making.     Margaretann Loveless, MD 04/21/14 [id number]  Tanna Furry, MD 05/01/14 [phone]

## 2014-04-20 NOTE — ED Notes (Signed)
Pt sent from [facility] for further eval of neck pain and fever with some HA x 2 days

## 2014-04-23 LAB — HERPES SIMPLEX VIRUS(HSV) DNA BY PCR
HSV 1 DNA: NOT DETECTED
HSV 2 DNA: NOT DETECTED

## 2014-04-24 LAB — CSF CULTURE: CULTURE: NO GROWTH

## 2014-04-24 LAB — CSF CULTURE W GRAM STAIN

## 2015-06-11 ENCOUNTER — Ambulatory Visit: Payer: Managed Care, Other (non HMO)

## 2015-12-23 DIAGNOSIS — F419 Anxiety disorder, unspecified: Secondary | ICD-10-CM | POA: Diagnosis not present

## 2015-12-23 DIAGNOSIS — F329 Major depressive disorder, single episode, unspecified: Secondary | ICD-10-CM | POA: Diagnosis not present

## 2015-12-23 DIAGNOSIS — M2550 Pain in unspecified joint: Secondary | ICD-10-CM | POA: Diagnosis not present

## 2016-03-06 DIAGNOSIS — J0100 Acute maxillary sinusitis, unspecified: Secondary | ICD-10-CM | POA: Diagnosis not present

## 2016-04-15 DIAGNOSIS — M5442 Lumbago with sciatica, left side: Secondary | ICD-10-CM | POA: Diagnosis not present

## 2016-05-14 DIAGNOSIS — R29898 Other symptoms and signs involving the musculoskeletal system: Secondary | ICD-10-CM | POA: Diagnosis not present

## 2016-05-14 DIAGNOSIS — M5442 Lumbago with sciatica, left side: Secondary | ICD-10-CM | POA: Diagnosis not present

## 2016-06-12 ENCOUNTER — Other Ambulatory Visit (HOSPITAL_COMMUNITY): Payer: Self-pay | Admitting: Family

## 2016-06-12 ENCOUNTER — Other Ambulatory Visit: Payer: Self-pay | Admitting: Family

## 2016-06-12 DIAGNOSIS — M5442 Lumbago with sciatica, left side: Secondary | ICD-10-CM

## 2016-09-14 ENCOUNTER — Emergency Department (HOSPITAL_COMMUNITY): Payer: Self-pay

## 2016-09-14 ENCOUNTER — Encounter (HOSPITAL_COMMUNITY): Payer: Self-pay | Admitting: Emergency Medicine

## 2016-09-14 ENCOUNTER — Emergency Department (HOSPITAL_COMMUNITY)
Admission: EM | Admit: 2016-09-14 | Discharge: 2016-09-14 | Disposition: A | Discharge status: Home / Self Care | Payer: Self-pay | Attending: Emergency Medicine | Admitting: Emergency Medicine

## 2016-09-14 DIAGNOSIS — N201 Calculus of ureter: Secondary | ICD-10-CM

## 2016-09-14 DIAGNOSIS — N202 Calculus of kidney with calculus of ureter: Secondary | ICD-10-CM | POA: Insufficient documentation

## 2016-09-14 LAB — COMPREHENSIVE METABOLIC PANEL
ALT: 13 U/L — ABNORMAL LOW (ref 14–54)
ANION GAP: 11 (ref 5–15)
AST: 21 U/L (ref 15–41)
Albumin: 3.9 g/dL (ref 3.5–5.0)
Alkaline Phosphatase: 45 U/L (ref 38–126)
BUN: 15 mg/dL (ref 6–20)
CALCIUM: 8.6 mg/dL — AB (ref 8.9–10.3)
CO2: 21 mmol/L — ABNORMAL LOW (ref 22–32)
Chloride: 107 mmol/L (ref 101–111)
Creatinine, Ser: 0.84 mg/dL (ref 0.44–1.00)
GFR calc non Af Amer: >60 mL/min (ref >60)
Glucose, Bld: 169 mg/dL — ABNORMAL HIGH (ref 65–99)
Potassium: 3.8 mmol/L (ref 3.5–5.1)
Sodium: 139 mmol/L (ref 135–145)
TOTAL PROTEIN: 6.7 g/dL (ref 6.5–8.1)
Total Bilirubin: 0.5 mg/dL (ref 0.3–1.2)

## 2016-09-14 LAB — LIPASE, BLOOD: Lipase: 40 U/L (ref 11–51)

## 2016-09-14 LAB — CBC
HCT: 36.6 % (ref 36.0–46.0)
Hemoglobin: 11.5 g/dL — ABNORMAL LOW (ref 12.0–15.0)
MCH: 26.4 pg (ref 26.0–34.0)
MCHC: 31.4 g/dL (ref 30.0–36.0)
MCV: 84.1 fL (ref 78.0–100.0)
Platelets: 359 10*3/uL (ref 150–400)
RBC: 4.35 MIL/uL (ref 3.87–5.11)
RDW: 13.8 % (ref 11.5–15.5)
WBC: 9.2 10*3/uL (ref 4.0–10.5)

## 2016-09-14 LAB — I-STAT BETA HCG BLOOD, ED (MC, WL, AP ONLY)

## 2016-09-14 MED ORDER — TAMSULOSIN HCL 0.4 MG PO CAPS: 0.4000 mg | ORAL_CAPSULE | Freq: Every day | ORAL | 0 refills | Status: DC

## 2016-09-14 MED ORDER — ONDANSETRON 4 MG PO TBDP: 4.0000 mg | ORAL_TABLET | Freq: Once | ORAL | Status: DC | PRN

## 2016-09-14 MED ORDER — ONDANSETRON HCL 4 MG/2ML IJ SOLN
4.0000 mg | Freq: Once | INTRAMUSCULAR | Status: AC | PRN
  Administered 2016-09-14: 4 mg via INTRAVENOUS
  Filled 2016-09-14: qty 2

## 2016-09-14 MED ORDER — MORPHINE SULFATE (PF) 4 MG/ML IV SOLN
4.0000 mg | Freq: Once | INTRAVENOUS | Status: AC
  Administered 2016-09-14: 4 mg via INTRAVENOUS
  Filled 2016-09-14: qty 1

## 2016-09-14 MED ORDER — HYDROMORPHONE HCL 1 MG/ML IJ SOLN
1.0000 mg | Freq: Once | INTRAMUSCULAR | Status: AC
  Administered 2016-09-14: 1 mg via INTRAVENOUS
  Filled 2016-09-14: qty 1

## 2016-09-14 MED ORDER — SODIUM CHLORIDE 0.9 % IV BOLUS (SEPSIS)
500.0000 mL | Freq: Once | INTRAVENOUS | Status: AC
  Administered 2016-09-14: 500 mL via INTRAVENOUS

## 2016-09-14 MED ORDER — KETOROLAC TROMETHAMINE 30 MG/ML IJ SOLN
30.0000 mg | Freq: Once | INTRAMUSCULAR | Status: AC
  Administered 2016-09-14: 30 mg via INTRAVENOUS
  Filled 2016-09-14: qty 1

## 2016-09-14 MED ORDER — PROMETHAZINE HCL 25 MG PO TABS: 25.0000 mg | ORAL_TABLET | Freq: Three times a day (TID) | ORAL | 0 refills | Status: DC | PRN

## 2016-09-14 MED ORDER — PROMETHAZINE HCL 25 MG/ML IJ SOLN
25.0000 mg | Freq: Once | INTRAMUSCULAR | Status: AC
  Administered 2016-09-14: 25 mg via INTRAVENOUS
  Filled 2016-09-14: qty 1

## 2016-09-14 MED ORDER — OXYCODONE-ACETAMINOPHEN 5-325 MG PO TABS: 1.0000 | ORAL_TABLET | Freq: Four times a day (QID) | ORAL | 0 refills | Status: DC | PRN

## 2016-09-14 NOTE — ED Triage Notes (Addendum)
Pt c/o severe stabbing R sided abd pain that woke her up at 0430 this morning. Pt reports nausea and vomiting x4. Pt cool to touch, diaphoretic, unable to sit still due to pain.

## 2016-09-14 NOTE — ED Notes (Signed)
Patient transported to CT 

## 2016-09-14 NOTE — ED Provider Notes (Signed)
[facility] DEPT Provider Note   CSN: [id number] Arrival date & time: 09/14/16  0539     History   Chief Complaint Chief Complaint  Patient presents with  . Abdominal Pain    HPI Shelly [name] is a 31 y.o. female.  HPI Patient presents to the emergency department with right-sided flank and abdominal pain.  The patient states that she woke at 4:30 AM with severe right flank and back pain.  The patient states that she did not take any medications prior to arrival.  States nothing seems make the condition better or worse.  She states that she has never had pain similar to this in the past.  Patient denies any vaginal bleeding or discharge. The patient denies chest pain, shortness of breath, headache,blurred vision, neck pain, fever, cough, weakness, numbness, dizziness, anorexia, edema,  vomiting, diarrhea, rash,  dysuria, hematemesis, bloody stool, near syncope, or syncope. Past Medical History:  Diagnosis Date  . Anxiety   . Endometriosis of pelvis 04/21/2012   Diagnosed on pathology after operative laparoscopy on 04/21/2012   . PONV (postoperative nausea and vomiting)     Patient Active Problem List   Diagnosis Date Noted  . Abdominal pain, chronic, epigastric 02/15/2013  . Change in bowel habits 02/15/2013  . Bloating 02/15/2013  . Anemia, iron deficiency 02/15/2013  . Secondary female infertility 04/21/2012  . Endometriosis of pelvis 04/21/2012    Past Surgical History:  Procedure Laterality Date  . CESAREAN SECTION  09/17/00, 08/31/02, 06/14/08   x 3  . CHROMOPERTUBATION  04/21/2012   Procedure: CHROMOPERTUBATION;  Surgeon: Osborne Oman, MD;  Location: [facility];  Service: Gynecology;  Laterality: N/A;  . LAPAROSCOPIC LYSIS OF ADHESIONS  04/21/2012   Procedure: LAPAROSCOPIC LYSIS OF ADHESIONS;  Surgeon: Osborne Oman, MD;  Location: [facility];  Service: Gynecology;  Laterality: N/A;  . LAPAROSCOPY  04/21/2012   Procedure: LAPAROSCOPY OPERATIVE;  Surgeon: Osborne Oman, MD;   Location: [facility];  Service: Gynecology;  Laterality: N/A;  Peritoneal biopsies    OB History    Gravida Para Term Preterm AB Living   3 3 3  0 0 3   SAB TAB Ectopic Multiple Live Births   0 0 0 0         Home Medications    Prior to Admission medications   Medication Sig Start Date End Date Taking? Authorizing Provider  albuterol (PROVENTIL HFA;VENTOLIN HFA) 108 (90 BASE) MCG/ACT inhaler Inhale 2 puffs into the lungs every 4 (four) hours as needed for wheezing or shortness of breath (cough, shortness of breath or wheezing.). 10/01/13  Yes Darlyne Russian, MD  sertraline (ZOLOFT) 100 MG tablet Take 100 mg by mouth daily. 03/22/15  Yes [provider]  cyclobenzaprine (FLEXERIL) 10 MG tablet Take 1 tablet (10 mg total) by mouth 2 (two) times daily as needed for muscle spasms. Patient not taking: Reported on 09/14/2016 02/08/14   Copland, Gay Filler, MD    Family History Family History  Problem Relation Age of Onset  . Breast cancer Mother   . Breast cancer Paternal Aunt   . Colon cancer Other        greatgrandfather and great uncle    Social History Social History  Substance Use Topics  . Smoking status: Never Smoker  . Smokeless tobacco: Never Used  . Alcohol use No     Allergies   Patient has no known allergies.   Review of Systems Review of Systems All other systems negative except  as documented in the HPI. All pertinent positives and negatives as reviewed in the HPI.   Physical Exam Updated Vital Signs BP 113/87   Pulse (!) 59   Temp 98.2 F (36.8 C) (Oral)   Resp 20   Ht 5\' 1"  (1.549 m)   Wt 58.5 kg (129 lb)   LMP 08/11/2016   SpO2 98%   BMI 24.37 kg/m   Physical Exam  Constitutional: She is oriented to person, place, and time. She appears well-developed and well-nourished. No distress.  HENT:  Head: Normocephalic and atraumatic.  Mouth/Throat: Oropharynx is clear and moist.  Eyes: Pupils are equal, round, and reactive to light.  Neck: Normal  range of motion. Neck supple.  Cardiovascular: Normal rate, regular rhythm and normal heart sounds.  Exam reveals no gallop and no friction rub.   No murmur heard. Pulmonary/Chest: Effort normal and breath sounds normal. No respiratory distress. She has no wheezes.  Abdominal: Soft. Normal appearance and bowel sounds are normal. She exhibits no distension and no mass. There is tenderness in the right lower quadrant, periumbilical area and suprapubic area. There is guarding. There is no rigidity and no rebound.  Neurological: She is alert and oriented to person, place, and time. She exhibits normal muscle tone. Coordination normal.  Skin: Skin is warm and dry. Capillary refill takes less than 2 seconds. No rash noted. No erythema.  Psychiatric: She has a normal mood and affect. Her behavior is normal.  Nursing note and vitals reviewed.    ED Treatments / Results  Labs (all labs ordered are listed, but only abnormal results are displayed) Labs Reviewed  COMPREHENSIVE METABOLIC PANEL - Abnormal; Notable for the following:       Result Value   CO2 21 (*)    Glucose, Bld 169 (*)    Calcium 8.6 (*)    ALT 13 (*)    All other components within normal limits  CBC - Abnormal; Notable for the following:    Hemoglobin 11.5 (*)    All other components within normal limits  LIPASE, BLOOD  URINALYSIS, ROUTINE W REFLEX MICROSCOPIC  I-STAT BETA HCG BLOOD, ED ([facility], [facility], [facility] ONLY)    EKG  EKG Interpretation None       Radiology No results found.  Procedures Procedures (including critical care time)  Medications Ordered in ED Medications  morphine 4 MG/ML injection 4 mg (4 mg Intravenous Given 09/14/16 0603)  ondansetron (ZOFRAN) injection 4 mg (4 mg Intravenous Given 09/14/16 0603)  HYDROmorphone (DILAUDID) injection 1 mg (1 mg Intravenous Given 09/14/16 3016)     Initial Impression / Assessment and Plan / ED Course  I have reviewed the triage vital signs and the nursing notes.  Pertinent  labs & imaging results that were available during my care of the patient were reviewed by me and considered in my medical decision making (see chart for details).     6:58 AM recheck.  The patient, her pain level has decreased significantly, but still severely uncomfortable.  I advised the patient that we are waiting her to get a CT scan.  I advised her that this was completely negative, we would need to maybe think about ultrasound for things like ovarian torsion and the patient voiced an understanding.    7:30 AM.  Patient returned from CT scan and considerable pain.  We will give 1 mg of Dilaudid, which was ordered at that time. \  8:15 AM I went and advised the patient that she does  of 3.4 mm ureteral stone that is at the junction of the bladder.  The patient is feeling some better following the last round of pain medication.  She just received Toradol and Phenergan.  I advised her we will recheck her in just a little while.  We will give her something by mouth see how she tolerates this.   10:05 AM recheck.  The patient.  She is feeling considerably better, stated that the Toradol worked very well.  She is starting to feel that the pain is returning.  We have her one more dose of pain medication, have her follow-up with urology.  Patient agrees the plan and all questions were answered Final Clinical Impressions(s) / ED Diagnoses   Final diagnoses:  None    New Prescriptions New Prescriptions   No medications on file     Rebeca Allegra 09/14/16 [place], [place], MD 09/17/16 [phone]

## 2016-09-14 NOTE — Discharge Instructions (Signed)
Return here as needed.  Follow up with the urologist provided.  Increase your fluid intake °

## 2016-09-21 DIAGNOSIS — Z09 Encounter for follow-up examination after completed treatment for conditions other than malignant neoplasm: Secondary | ICD-10-CM | POA: Diagnosis not present

## 2016-09-21 DIAGNOSIS — R399 Unspecified symptoms and signs involving the genitourinary system: Secondary | ICD-10-CM | POA: Diagnosis not present

## 2016-09-21 DIAGNOSIS — J45909 Unspecified asthma, uncomplicated: Secondary | ICD-10-CM | POA: Diagnosis not present

## 2016-09-21 DIAGNOSIS — R8290 Unspecified abnormal findings in urine: Secondary | ICD-10-CM | POA: Diagnosis not present

## 2016-09-21 DIAGNOSIS — N390 Urinary tract infection, site not specified: Secondary | ICD-10-CM | POA: Diagnosis not present

## 2016-09-21 DIAGNOSIS — R109 Unspecified abdominal pain: Secondary | ICD-10-CM | POA: Diagnosis not present

## 2016-09-21 DIAGNOSIS — Z79899 Other long term (current) drug therapy: Secondary | ICD-10-CM | POA: Diagnosis not present

## 2016-09-21 DIAGNOSIS — N201 Calculus of ureter: Secondary | ICD-10-CM | POA: Diagnosis not present

## 2016-09-21 DIAGNOSIS — N136 Pyonephrosis: Secondary | ICD-10-CM | POA: Diagnosis not present

## 2016-09-21 DIAGNOSIS — N200 Calculus of kidney: Secondary | ICD-10-CM | POA: Diagnosis not present

## 2016-09-22 DIAGNOSIS — R109 Unspecified abdominal pain: Secondary | ICD-10-CM | POA: Diagnosis not present

## 2016-09-22 DIAGNOSIS — N390 Urinary tract infection, site not specified: Secondary | ICD-10-CM | POA: Diagnosis not present

## 2016-09-22 DIAGNOSIS — J45909 Unspecified asthma, uncomplicated: Secondary | ICD-10-CM | POA: Diagnosis not present

## 2016-09-22 DIAGNOSIS — N136 Pyonephrosis: Secondary | ICD-10-CM | POA: Diagnosis not present

## 2016-09-22 DIAGNOSIS — N201 Calculus of ureter: Secondary | ICD-10-CM | POA: Diagnosis not present

## 2016-09-22 DIAGNOSIS — Z79899 Other long term (current) drug therapy: Secondary | ICD-10-CM | POA: Diagnosis not present

## 2016-09-22 DIAGNOSIS — N200 Calculus of kidney: Secondary | ICD-10-CM | POA: Diagnosis not present

## 2016-09-25 ENCOUNTER — Emergency Department (HOSPITAL_COMMUNITY)
Admission: EM | Admit: 2016-09-25 | Discharge: 2016-09-26 | Disposition: A | Discharge status: Home / Self Care | Payer: 59 | Attending: Emergency Medicine | Admitting: Emergency Medicine

## 2016-09-25 ENCOUNTER — Encounter (HOSPITAL_COMMUNITY): Payer: Self-pay | Admitting: *Deleted

## 2016-09-25 DIAGNOSIS — N3001 Acute cystitis with hematuria: Secondary | ICD-10-CM | POA: Diagnosis not present

## 2016-09-25 DIAGNOSIS — N390 Urinary tract infection, site not specified: Secondary | ICD-10-CM | POA: Diagnosis not present

## 2016-09-25 DIAGNOSIS — R1030 Lower abdominal pain, unspecified: Secondary | ICD-10-CM | POA: Diagnosis not present

## 2016-09-25 DIAGNOSIS — Z79899 Other long term (current) drug therapy: Secondary | ICD-10-CM | POA: Diagnosis not present

## 2016-09-25 DIAGNOSIS — R1032 Left lower quadrant pain: Secondary | ICD-10-CM | POA: Diagnosis not present

## 2016-09-25 DIAGNOSIS — R319 Hematuria, unspecified: Secondary | ICD-10-CM

## 2016-09-25 DIAGNOSIS — R1012 Left upper quadrant pain: Secondary | ICD-10-CM | POA: Diagnosis not present

## 2016-09-25 DIAGNOSIS — R109 Unspecified abdominal pain: Secondary | ICD-10-CM

## 2016-09-25 LAB — CBC
HCT: 36.1 % (ref 36.0–46.0)
Hemoglobin: 11.4 g/dL — ABNORMAL LOW (ref 12.0–15.0)
MCH: 26.7 pg (ref 26.0–34.0)
MCHC: 31.6 g/dL (ref 30.0–36.0)
MCV: 84.5 fL (ref 78.0–100.0)
PLATELETS: 361 10*3/uL (ref 150–400)
RBC: 4.27 MIL/uL (ref 3.87–5.11)
RDW: 13.3 % (ref 11.5–15.5)
WBC: 10.6 10*3/uL — AB (ref 4.0–10.5)

## 2016-09-25 LAB — BASIC METABOLIC PANEL
Anion gap: 9 (ref 5–15)
BUN: 18 mg/dL (ref 6–20)
CALCIUM: 9.2 mg/dL (ref 8.9–10.3)
CO2: 27 mmol/L (ref 22–32)
CREATININE: 0.81 mg/dL (ref 0.44–1.00)
Chloride: 103 mmol/L (ref 101–111)
Glucose, Bld: 84 mg/dL (ref 65–99)
Potassium: 3.8 mmol/L (ref 3.5–5.1)
SODIUM: 139 mmol/L (ref 135–145)

## 2016-09-25 LAB — URINALYSIS, ROUTINE W REFLEX MICROSCOPIC
Bilirubin Urine: NEGATIVE
Glucose, UA: NEGATIVE mg/dL
Ketones, ur: NEGATIVE mg/dL
Nitrite: NEGATIVE
Protein, ur: 30 mg/dL — AB
Specific Gravity, Urine: 1.01 (ref 1.005–1.030)
pH: 8 (ref 5.0–8.0)

## 2016-09-25 LAB — I-STAT CG4 LACTIC ACID, ED: LACTIC ACID, VENOUS: 0.91 mmol/L (ref 0.5–1.9)

## 2016-09-25 MED ORDER — ONDANSETRON HCL 4 MG/2ML IJ SOLN
4.0000 mg | Freq: Once | INTRAMUSCULAR | Status: AC
  Administered 2016-09-25: 4 mg via INTRAVENOUS
  Filled 2016-09-25: qty 2

## 2016-09-25 MED ORDER — SODIUM CHLORIDE 0.9 % IV BOLUS (SEPSIS)
1000.0000 mL | Freq: Once | INTRAVENOUS | Status: AC
  Administered 2016-09-25: 1000 mL via INTRAVENOUS

## 2016-09-25 MED ORDER — HYDROMORPHONE HCL 1 MG/ML IJ SOLN
1.0000 mg | Freq: Once | INTRAMUSCULAR | Status: AC
  Administered 2016-09-25: 1 mg via INTRAVENOUS
  Filled 2016-09-25: qty 1

## 2016-09-25 MED ORDER — ONDANSETRON 4 MG PO TBDP
4.0000 mg | ORAL_TABLET | Freq: Once | ORAL | Status: AC
  Administered 2016-09-25: 4 mg via ORAL

## 2016-09-25 MED ORDER — OXYCODONE-ACETAMINOPHEN 5-325 MG PO TABS
1.0000 | ORAL_TABLET | ORAL | Status: DC | PRN
  Administered 2016-09-25: 1 via ORAL

## 2016-09-25 MED ORDER — OXYCODONE-ACETAMINOPHEN 5-325 MG PO TABS
ORAL_TABLET | ORAL | Status: AC
  Administered 2016-09-25: 1 via ORAL
  Filled 2016-09-25: qty 1

## 2016-09-25 MED ORDER — ONDANSETRON 4 MG PO TBDP
ORAL_TABLET | ORAL | Status: AC
  Administered 2016-09-25: 4 mg via ORAL
  Filled 2016-09-25: qty 1

## 2016-09-25 NOTE — ED Notes (Signed)
Pt stated Abd pain waned, then returned while waiting to be roomed. Pt stated the pain "is lower and sharper". Pain level 6

## 2016-09-25 NOTE — ED Triage Notes (Signed)
Pt arrives with c/o severe left flank pain that began approx. 1.5 hrs ago that is accompanied by nausea and hematuria with urgency.

## 2016-09-25 NOTE — ED Provider Notes (Signed)
[facility] DEPT Provider Note   CSN: [id number] Arrival date & time: 09/25/16  1648 By signing my name below, I, Dyke Brackett, attest that this documentation has been prepared under the direction and in the presence of Alleya Demeter, Annie Main, MD . Electronically Signed: Dyke Brackett, Scribe. 09/26/2016. 12:09 AM.   History   Chief Complaint Chief Complaint  Patient presents with  . Flank Pain    HPI Shelly [name] is a 31 y.o. female who presents to the Emergency Department complaining of constant, 7/10 left lateral flank pain onset six hours ago. Pt reports associated back pain, dysuria, hematuria, and urgency No alleviating or exacerbating factors noted.  Pt states she was here 2 weeks ago where she had a CT which showed right uretal obstructing stone and left renal stone. Pt had Cystourethroscopy and right JJ stent which was performed at [facility] on 09/22/16. She is currently on cefdinir. She denies any right lateral flank pain, fever, nausea, or vomiting.   The history is provided by the patient and medical records. No language interpreter was used.    Past Medical History:  Diagnosis Date  . Anxiety   . Endometriosis of pelvis 04/21/2012   Diagnosed on pathology after operative laparoscopy on 04/21/2012   . PONV (postoperative nausea and vomiting)     Patient Active Problem List   Diagnosis Date Noted  . Abdominal pain, chronic, epigastric 02/15/2013  . Change in bowel habits 02/15/2013  . Bloating 02/15/2013  . Anemia, iron deficiency 02/15/2013  . Secondary female infertility 04/21/2012  . Endometriosis of pelvis 04/21/2012    Past Surgical History:  Procedure Laterality Date  . CESAREAN SECTION  09/17/00, 08/31/02, 06/14/08   x 3  . CHROMOPERTUBATION  04/21/2012   Procedure: CHROMOPERTUBATION;  Surgeon: Osborne Oman, MD;  Location: [facility];  Service: Gynecology;  Laterality: N/A;  . LAPAROSCOPIC LYSIS OF ADHESIONS  04/21/2012   Procedure: LAPAROSCOPIC LYSIS OF  ADHESIONS;  Surgeon: Osborne Oman, MD;  Location: [facility];  Service: Gynecology;  Laterality: N/A;  . LAPAROSCOPY  04/21/2012   Procedure: LAPAROSCOPY OPERATIVE;  Surgeon: Osborne Oman, MD;  Location: [facility];  Service: Gynecology;  Laterality: N/A;  Peritoneal biopsies    OB History    Gravida Para Term Preterm AB Living   3 3 3  0 0 3   SAB TAB Ectopic Multiple Live Births   0 0 0 0         Home Medications    Prior to Admission medications   Medication Sig Start Date End Date Taking? Authorizing Provider  albuterol (PROVENTIL HFA;VENTOLIN HFA) 108 (90 BASE) MCG/ACT inhaler Inhale 2 puffs into the lungs every 4 (four) hours as needed for wheezing or shortness of breath (cough, shortness of breath or wheezing.). 10/01/13   Darlyne Russian, MD  cyclobenzaprine (FLEXERIL) 10 MG tablet Take 1 tablet (10 mg total) by mouth 2 (two) times daily as needed for muscle spasms. Patient not taking: Reported on 09/14/2016 02/08/14   Copland, Gay Filler, MD  oxyCODONE-acetaminophen (PERCOCET/ROXICET) 5-325 MG tablet Take 1 tablet by mouth every 6 (six) hours as needed for severe pain. 09/14/16   Lawyer, Harrell Gave, PA-C  promethazine (PHENERGAN) 25 MG tablet Take 1 tablet (25 mg total) by mouth every 8 (eight) hours as needed for nausea or vomiting. 09/14/16   Lawyer, Harrell Gave, PA-C  sertraline (ZOLOFT) 100 MG tablet Take 100 mg by mouth daily. 03/22/15   [provider]  tamsulosin (FLOMAX) 0.4 MG CAPS capsule  Take 1 capsule (0.4 mg total) by mouth daily. 09/14/16   Dalia Heading, PA-C    Family History Family History  Problem Relation Age of Onset  . Breast cancer Mother   . Breast cancer Paternal Aunt   . Colon cancer Other        greatgrandfather and great uncle    Social History Social History  Substance Use Topics  . Smoking status: Never Smoker  . Smokeless tobacco: Never Used  . Alcohol use No     Allergies   Patient has no known allergies.   Review of  Systems Review of Systems All systems reviewed and are negative for acute change except as noted in the HPI.  Physical Exam Updated Vital Signs BP 109/62 (BP Location: Left Arm)   Pulse (!) 56   Temp 98.4 F (36.9 C) (Oral)   Resp 17   Ht 5\' 1"  (1.549 m)   Wt 126 lb (57.2 kg)   SpO2 100%   BMI 23.81 kg/m   Physical Exam  Constitutional: She is oriented to person, place, and time. She appears well-developed and well-nourished. No distress.  HENT:  Head: Normocephalic and atraumatic.  Mouth/Throat: Oropharynx is clear and moist. No oropharyngeal exudate.  Eyes: Conjunctivae and EOM are normal. Pupils are equal, round, and reactive to light.  Neck: Normal range of motion. Neck supple.  No meningismus.  Cardiovascular: Normal rate, regular rhythm, normal heart sounds and intact distal pulses.   No murmur heard. Pulmonary/Chest: Effort normal and breath sounds normal. No respiratory distress.  Abdominal: Soft. There is tenderness. There is no rebound and no guarding.  Diffusely tender, worse in LUQ and LLQ. Suprapubic tenderness.   Musculoskeletal: Normal range of motion. She exhibits no edema or tenderness.  Neurological: She is alert and oriented to person, place, and time. No cranial nerve deficit. She exhibits normal muscle tone. Coordination normal.   5/5 strength throughout. CN 2-12 intact.Equal grip strength.   Skin: Skin is warm.  Psychiatric: She has a normal mood and affect. Her behavior is normal.  Nursing note and vitals reviewed.  ED Treatments / Results  DIAGNOSTIC STUDIES:  Oxygen Saturation is 100% on RA, normal by my interpretation.    COORDINATION OF CARE:  11:35 PM Discussed treatment plan with pt at bedside and pt agreed to plan.   Labs (all labs ordered are listed, but only abnormal results are displayed) Labs Reviewed  URINALYSIS, ROUTINE W REFLEX MICROSCOPIC - Abnormal; Notable for the following:       Result Value   APPearance HAZY (*)    Hgb  urine dipstick LARGE (*)    Protein, ur 30 (*)    Leukocytes, UA MODERATE (*)    Bacteria, UA RARE (*)    Squamous Epithelial / LPF 0-5 (*)    All other components within normal limits  CBC - Abnormal; Notable for the following:    WBC 10.6 (*)    Hemoglobin 11.4 (*)    All other components within normal limits  URINE CULTURE  BASIC METABOLIC PANEL  PREGNANCY, URINE  I-STAT CG4 LACTIC ACID, ED  I-STAT CG4 LACTIC ACID, ED    EKG  EKG Interpretation None       Radiology Ct Renal Stone Study  Result Date: 09/26/2016 CLINICAL DATA:  Severe left flank pain with hematuria EXAM: CT ABDOMEN AND PELVIS WITHOUT CONTRAST TECHNIQUE: Multidetector CT imaging of the abdomen and pelvis was performed following the standard protocol without IV contrast. COMPARISON:  09/14/2016 FINDINGS:  Lower chest: No acute abnormality. Partially visualized right breast prosthesis. Hepatobiliary: No focal liver abnormality is seen. No gallstones, gallbladder wall thickening, or biliary dilatation. Pancreas: Unremarkable. No pancreatic ductal dilatation or surrounding inflammatory changes. Spleen: Normal in size without focal abnormality. Adrenals/Urinary Tract: Adrenal glands are within normal limits. Interim placement of right ureteral stent with decreased right hydronephrosis compared to prior. Distal portion of the stent is coiled within the right aspect of the bladder. The previously noted cystic ureteral stone is not seen. Interim development of moderate left hydronephrosis and hydroureter, this is secondary to a 5 mm stone in the distal left ureter, near the left UVJ. Stomach/Bowel: Stomach is within normal limits. Appendix contains small stone but no inflammation. No evidence of bowel wall thickening, distention, or inflammatory changes. Vascular/Lymphatic: No significant vascular findings are present. No enlarged abdominal or pelvic lymph nodes. Reproductive: Uterus and bilateral adnexa are unremarkable. Other: No  free air. Trace free fluid in the pelvis. Small fat in the umbilicus. Musculoskeletal: No acute or significant osseous findings. IMPRESSION: 1. New moderate left hydronephrosis and hydroureter secondary to a 5 mm stone in the distal left ureter near the left UVJ. 2. Interim placement of right-sided ureteral stent with decreased hydronephrosis. Previously noted distal ureteral stone is no longer seen. 3. Trace free fluid in the pelvis Electronically Signed   By: Donavan Foil M.D.   On: 09/26/2016 02:16    Procedures Procedures (including critical care time)  Medications Ordered in ED Medications  oxyCODONE-acetaminophen (PERCOCET/ROXICET) 5-325 MG per tablet 1 tablet (1 tablet Oral Given 09/25/16 1754)  ondansetron (ZOFRAN-ODT) disintegrating tablet 4 mg (4 mg Oral Given 09/25/16 1755)     Initial Impression / Assessment and Plan / ED Course  I have reviewed the triage vital signs and the nursing notes.  Pertinent labs & imaging results that were available during my care of the patient were reviewed by me and considered in my medical decision making (see chart for details).     Patient with left-sided flank pain that onset today. Associated with dysuria and hematuria. Currently on Omnicef for infected kidney stone on the right side with stent in place  CT scan confirms left-sided ureterolithiasis with hydronephrosis. Creatinine is normal. White count is mildly elevated. Patient is afebrile. Urinalysis does appear infected.  Patient does not want to go back to her urologist in [facility]. Discussed with on-call urology Dr. Jonny Ruiz who feels if the patient's pain is controlled and she is not febrile she can go home with antibiotics. Does not feel she needs emergent stent unless she is septic or develops kidney failure. Also d/w Dr. Nevada Crane on call for Dr. Felipa Eth of [facility]. He agrees with outpatient management, continue antibiotics and pain control. Follow-up in the  office.  Patient's pain is controlled. She is not febrile. She is tolerant by mouth. We'll continue her antibiotics. Follow-up with her urologist in [facility] or [facility]. She is scheduled for stent removal next week. Return to the ED with worsening pain, fever, vomiting or any other concerns. Final Clinical Impressions(s) / ED Diagnoses   Final diagnoses:  Flank pain  Urinary tract infection with hematuria, site unspecified    New Prescriptions New Prescriptions   No medications on file   I personally performed the services described in this documentation, which was scribed in my presence. The recorded information has been reviewed and is accurate.    Ezequiel Essex, MD 09/26/16 [phone]

## 2016-09-25 NOTE — ED Notes (Signed)
ED Provider at bedside. 

## 2016-09-26 ENCOUNTER — Emergency Department (HOSPITAL_COMMUNITY): Payer: 59

## 2016-09-26 DIAGNOSIS — R109 Unspecified abdominal pain: Secondary | ICD-10-CM | POA: Diagnosis not present

## 2016-09-26 LAB — PREGNANCY, URINE: Preg Test, Ur: NEGATIVE

## 2016-09-26 MED ORDER — DEXTROSE 5 % IV SOLN
1.0000 g | Freq: Once | INTRAVENOUS | Status: AC
  Administered 2016-09-26: 1 g via INTRAVENOUS
  Filled 2016-09-26: qty 10

## 2016-09-26 MED ORDER — TAMSULOSIN HCL 0.4 MG PO CAPS: 0.4000 mg | ORAL_CAPSULE | Freq: Every day | ORAL | 0 refills | Status: DC

## 2016-09-26 MED ORDER — IBUPROFEN 800 MG PO TABS: 800.0000 mg | ORAL_TABLET | Freq: Three times a day (TID) | ORAL | 0 refills | Status: DC

## 2016-09-26 MED ORDER — OXYCODONE-ACETAMINOPHEN 5-325 MG PO TABS: 1.0000 | ORAL_TABLET | ORAL | 0 refills | Status: DC | PRN

## 2016-09-26 NOTE — ED Notes (Signed)
Patient transported to CT 

## 2016-09-26 NOTE — ED Notes (Addendum)
Pt able to handle fluid challenge without difficulty. Pt states that she wants to go home.

## 2016-09-26 NOTE — ED Notes (Signed)
Pt departed in NAD, refused use of wheelchair.  

## 2016-09-26 NOTE — Discharge Instructions (Signed)
Continue your antibiotics. Follow-up with your urologist. Return to the ED if you develop worsening pain, fever, vomiting or any other concerns.

## 2016-09-27 LAB — URINE CULTURE: CULTURE: NO GROWTH

## 2016-09-30 DIAGNOSIS — N201 Calculus of ureter: Secondary | ICD-10-CM | POA: Diagnosis not present

## 2016-09-30 DIAGNOSIS — N200 Calculus of kidney: Secondary | ICD-10-CM | POA: Diagnosis not present

## 2016-10-13 DIAGNOSIS — N200 Calculus of kidney: Secondary | ICD-10-CM | POA: Diagnosis not present

## 2016-12-01 DIAGNOSIS — B9689 Other specified bacterial agents as the cause of diseases classified elsewhere: Secondary | ICD-10-CM | POA: Diagnosis not present

## 2016-12-01 DIAGNOSIS — N760 Acute vaginitis: Secondary | ICD-10-CM | POA: Diagnosis not present

## 2016-12-01 DIAGNOSIS — Z113 Encounter for screening for infections with a predominantly sexual mode of transmission: Secondary | ICD-10-CM | POA: Diagnosis not present

## 2016-12-01 DIAGNOSIS — B373 Candidiasis of vulva and vagina: Secondary | ICD-10-CM | POA: Diagnosis not present

## 2016-12-09 DIAGNOSIS — F329 Major depressive disorder, single episode, unspecified: Secondary | ICD-10-CM | POA: Diagnosis not present

## 2016-12-09 DIAGNOSIS — F419 Anxiety disorder, unspecified: Secondary | ICD-10-CM | POA: Diagnosis not present

## 2016-12-28 DIAGNOSIS — N9089 Other specified noninflammatory disorders of vulva and perineum: Secondary | ICD-10-CM | POA: Diagnosis not present

## 2016-12-29 DIAGNOSIS — N9089 Other specified noninflammatory disorders of vulva and perineum: Secondary | ICD-10-CM | POA: Diagnosis not present

## 2017-01-27 DIAGNOSIS — Z23 Encounter for immunization: Secondary | ICD-10-CM | POA: Diagnosis not present

## 2017-04-16 ENCOUNTER — Other Ambulatory Visit: Payer: Self-pay

## 2017-04-16 ENCOUNTER — Inpatient Hospital Stay (HOSPITAL_COMMUNITY): Payer: 59

## 2017-04-16 ENCOUNTER — Inpatient Hospital Stay (HOSPITAL_COMMUNITY)
Admission: AD | Admit: 2017-04-16 | Discharge: 2017-04-16 | Disposition: A | Discharge status: Home / Self Care | Payer: 59 | Source: Ambulatory Visit | Attending: Obstetrics & Gynecology | Admitting: Obstetrics & Gynecology

## 2017-04-16 ENCOUNTER — Encounter (HOSPITAL_COMMUNITY): Payer: Self-pay | Admitting: *Deleted

## 2017-04-16 DIAGNOSIS — Z79899 Other long term (current) drug therapy: Secondary | ICD-10-CM | POA: Insufficient documentation

## 2017-04-16 DIAGNOSIS — O99341 Other mental disorders complicating pregnancy, first trimester: Secondary | ICD-10-CM | POA: Diagnosis not present

## 2017-04-16 DIAGNOSIS — Z3491 Encounter for supervision of normal pregnancy, unspecified, first trimester: Secondary | ICD-10-CM

## 2017-04-16 DIAGNOSIS — Z3A01 Less than 8 weeks gestation of pregnancy: Secondary | ICD-10-CM | POA: Diagnosis not present

## 2017-04-16 DIAGNOSIS — O26831 Pregnancy related renal disease, first trimester: Secondary | ICD-10-CM | POA: Insufficient documentation

## 2017-04-16 DIAGNOSIS — R109 Unspecified abdominal pain: Secondary | ICD-10-CM

## 2017-04-16 DIAGNOSIS — R1031 Right lower quadrant pain: Secondary | ICD-10-CM | POA: Insufficient documentation

## 2017-04-16 DIAGNOSIS — O26891 Other specified pregnancy related conditions, first trimester: Secondary | ICD-10-CM | POA: Diagnosis not present

## 2017-04-16 DIAGNOSIS — N189 Chronic kidney disease, unspecified: Secondary | ICD-10-CM | POA: Insufficient documentation

## 2017-04-16 DIAGNOSIS — F419 Anxiety disorder, unspecified: Secondary | ICD-10-CM | POA: Diagnosis not present

## 2017-04-16 HISTORY — DX: Chronic kidney disease, unspecified: N18.9

## 2017-04-16 LAB — URINALYSIS, ROUTINE W REFLEX MICROSCOPIC
BILIRUBIN URINE: NEGATIVE
Glucose, UA: NEGATIVE mg/dL
HGB URINE DIPSTICK: NEGATIVE
KETONES UR: NEGATIVE mg/dL
Leukocytes, UA: NEGATIVE
NITRITE: NEGATIVE
PROTEIN: NEGATIVE mg/dL
Specific Gravity, Urine: 1.025 (ref 1.005–1.030)
pH: 6 (ref 5.0–8.0)

## 2017-04-16 LAB — CBC
HCT: 35.4 % — ABNORMAL LOW (ref 36.0–46.0)
HEMOGLOBIN: 11.7 g/dL — AB (ref 12.0–15.0)
MCH: 27.5 pg (ref 26.0–34.0)
MCHC: 33.1 g/dL (ref 30.0–36.0)
MCV: 83.3 fL (ref 78.0–100.0)
PLATELETS: 347 10*3/uL (ref 150–400)
RBC: 4.25 MIL/uL (ref 3.87–5.11)
RDW: 14.1 % (ref 11.5–15.5)
WBC: 11.6 10*3/uL — AB (ref 4.0–10.5)

## 2017-04-16 LAB — WET PREP, GENITAL
CLUE CELLS WET PREP: NONE SEEN
Sperm: NONE SEEN
TRICH WET PREP: NONE SEEN
YEAST WET PREP: NONE SEEN

## 2017-04-16 LAB — POCT PREGNANCY, URINE: PREG TEST UR: POSITIVE — AB

## 2017-04-16 LAB — ABO/RH: ABO/RH(D): O POS

## 2017-04-16 LAB — HCG, QUANTITATIVE, PREGNANCY: HCG, BETA CHAIN, QUANT, S: 110109 m[IU]/mL — AB (ref <5)

## 2017-04-16 NOTE — Progress Notes (Signed)
Written and verbal d/c instructions given and understanding voiced. 

## 2017-04-16 NOTE — Discharge Instructions (Signed)

## 2017-04-16 NOTE — MAU Provider Note (Signed)
History     CSN: [id number]  Arrival date and time: 04/16/17 2052   First Provider Initiated Contact with Patient 04/16/17 2217     Chief Complaint  Patient presents with  . Abdominal Pain   HPI Shelly [name] is a 32 y.o. G4P3003 at [redacted]w[redacted]d who presents with right sided lower abdominal pain for 2 days. She rates the pain a 5/10 and has not tried anything for the pain. She denies any vaginal bleeding or discharge. She has a history of 3 c sections with previous pregnancies. She has not been seen anywhere in this pregnancy.    OB History    Gravida Para Term Preterm AB Living   4 3 3  0 0 3   SAB TAB Ectopic Multiple Live Births   0 0 0 0        Past Medical History:  Diagnosis Date  . Anxiety   . Chronic kidney disease   . Endometriosis of pelvis 04/21/2012   Diagnosed on pathology after operative laparoscopy on 04/21/2012   . PONV (postoperative nausea and vomiting)     Past Surgical History:  Procedure Laterality Date  . CESAREAN SECTION  09/17/00, 08/31/02, 06/14/08   x 3  . CHROMOPERTUBATION  04/21/2012   Procedure: CHROMOPERTUBATION;  Surgeon: Osborne Oman, MD;  Location: [facility];  Service: Gynecology;  Laterality: N/A;  . LAPAROSCOPIC LYSIS OF ADHESIONS  04/21/2012   Procedure: LAPAROSCOPIC LYSIS OF ADHESIONS;  Surgeon: Osborne Oman, MD;  Location: [facility];  Service: Gynecology;  Laterality: N/A;  . LAPAROSCOPY  04/21/2012   Procedure: LAPAROSCOPY OPERATIVE;  Surgeon: Osborne Oman, MD;  Location: [facility];  Service: Gynecology;  Laterality: N/A;  Peritoneal biopsies    Family History  Problem Relation Age of Onset  . Breast cancer Mother   . Breast cancer Paternal Aunt   . Colon cancer Other        greatgrandfather and great uncle    Social History   Tobacco Use  . Smoking status: Never Smoker  . Smokeless tobacco: Never Used  Substance Use Topics  . Alcohol use: No  . Drug use: No    Allergies: No Known Allergies  Medications Prior to Admission  Medication  Sig Dispense Refill Last Dose  . albuterol (PROVENTIL HFA;VENTOLIN HFA) 108 (90 BASE) MCG/ACT inhaler Inhale 2 puffs into the lungs every 4 (four) hours as needed for wheezing or shortness of breath (cough, shortness of breath or wheezing.). 1 Inhaler 3 Unknown at Unknown time  . cyclobenzaprine (FLEXERIL) 10 MG tablet Take 1 tablet (10 mg total) by mouth 2 (two) times daily as needed for muscle spasms. (Patient not taking: Reported on 09/14/2016) 30 tablet 0 Completed Course at Unknown time  . ibuprofen (ADVIL,MOTRIN) 800 MG tablet Take 1 tablet (800 mg total) by mouth 3 (three) times daily. 21 tablet 0   . Meth-Hyo-M Bl-Na Phos-Ph Sal (URIBEL) 118 MG CAPS Take 1 tablet by mouth every 6 (six) hours as needed (for pain).    09/25/2016 at Unknown time  . oxybutynin (DITROPAN) 5 MG tablet Take 5 mg by mouth 3 (three) times daily as needed for bladder spasms.    09/25/2016 at Unknown time  . oxyCODONE-acetaminophen (PERCOCET/ROXICET) 5-325 MG tablet Take 1 tablet by mouth every 4 (four) hours as needed for severe pain. 15 tablet 0   . promethazine (PHENERGAN) 25 MG tablet Take 1 tablet (25 mg total) by mouth every 8 (eight) hours as needed for nausea or vomiting. (Patient  not taking: Reported on 09/26/2016) 15 tablet 0 Completed Course at Unknown time  . sertraline (ZOLOFT) 100 MG tablet Take 100 mg by mouth daily.   09/25/2016 at Unknown time  . tamsulosin (FLOMAX) 0.4 MG CAPS capsule Take 1 capsule (0.4 mg total) by mouth daily. 10 capsule 0     Review of Systems  Constitutional: Negative.  Negative for fatigue and fever.  HENT: Negative.   Respiratory: Negative.  Negative for shortness of breath.   Cardiovascular: Negative.  Negative for chest pain.  Gastrointestinal: Positive for abdominal pain. Negative for constipation, diarrhea, nausea and vomiting.  Genitourinary: Negative.  Negative for dysuria.  Neurological: Negative.  Negative for dizziness and headaches.   Physical Exam   Blood pressure  117/76, pulse 93, temperature 98.3 F (36.8 C), resp. rate 18, height 5\' 1"  (1.549 m), weight 116 lb (52.6 kg), last menstrual period 02/28/2017.  Physical Exam  Nursing note and vitals reviewed. Constitutional: She is oriented to person, place, and time. She appears well-developed and well-nourished. No distress.  HENT:  Head: Normocephalic.  Eyes: Pupils are equal, round, and reactive to light.  Cardiovascular: Normal rate, regular rhythm and normal heart sounds.  Respiratory: Effort normal and breath sounds normal. No respiratory distress.  GI: Soft. Bowel sounds are normal. She exhibits no distension. There is tenderness in the right lower quadrant and suprapubic area. There is rebound and guarding.  Neurological: She is alert and oriented to person, place, and time.  Skin: Skin is warm and dry.  Psychiatric: She has a normal mood and affect. Her behavior is normal. Judgment and thought content normal.   Pelvic exam: Cervix pink, visually closed, without lesion, scant white creamy discharge, vaginal walls and external genitalia normal Bimanual exam: Cervix 0/long/high, firm, anterior, neg CMT, uterus nontender,adnexa without tenderness, enlargement, or mass  MAU Course  Procedures Results for orders placed or performed during the hospital encounter of 04/16/17 (from the past 24 hour(s))  Urinalysis, Routine w reflex microscopic     Status: None   Collection Time: 04/16/17  9:25 PM  Result Value Ref Range   Color, Urine YELLOW YELLOW   APPearance CLEAR CLEAR   Specific Gravity, Urine 1.025 1.005 - 1.030   pH 6.0 5.0 - 8.0   Glucose, UA NEGATIVE NEGATIVE mg/dL   Hgb urine dipstick NEGATIVE NEGATIVE   Bilirubin Urine NEGATIVE NEGATIVE   Ketones, ur NEGATIVE NEGATIVE mg/dL   Protein, ur NEGATIVE NEGATIVE mg/dL   Nitrite NEGATIVE NEGATIVE   Leukocytes, UA NEGATIVE NEGATIVE  Pregnancy, urine POC     Status: Abnormal   Collection Time: 04/16/17  9:41 PM  Result Value Ref Range    Preg Test, Ur POSITIVE (A) NEGATIVE  CBC     Status: Abnormal   Collection Time: 04/16/17  9:46 PM  Result Value Ref Range   WBC 11.6 (H) 4.0 - 10.5 K/uL   RBC 4.25 3.87 - 5.11 MIL/uL   Hemoglobin 11.7 (L) 12.0 - 15.0 g/dL   HCT 35.4 (L) 36.0 - 46.0 %   MCV 83.3 78.0 - 100.0 fL   MCH 27.5 26.0 - 34.0 pg   MCHC 33.1 30.0 - 36.0 g/dL   RDW 14.1 11.5 - 15.5 %   Platelets 347 150 - 400 K/uL  hCG, quantitative, pregnancy     Status: Abnormal   Collection Time: 04/16/17  9:46 PM  Result Value Ref Range   hCG, Beta Chain, Quant, S 110,109 (H) <5 mIU/mL  ABO/Rh  Status: None   Collection Time: 04/16/17  9:46 PM  Result Value Ref Range   ABO/RH(D) O POS   Wet prep, genital     Status: Abnormal   Collection Time: 04/16/17 10:20 PM  Result Value Ref Range   Yeast Wet Prep HPF POC NONE SEEN NONE SEEN   Trich, Wet Prep NONE SEEN NONE SEEN   Clue Cells Wet Prep HPF POC NONE SEEN NONE SEEN   WBC, Wet Prep HPF POC FEW (A) NONE SEEN   Sperm NONE SEEN    US Ob Comp Less 14 Wks  Result Date: 04/16/2017 CLINICAL DATA:  Right lower quadrant pain today. Previous history of C-section. Estimated gestational age by LMP is 6 weeks 5 days. Positive urine pregnancy test. Quantitative beta HCG is in progress. EXAM: OBSTETRIC <14 WK [place] AND TRANSVAGINAL OB US TECHNIQUE: Both transabdominal and transvaginal ultrasound examinations were performed for complete evaluation of the gestation as well as the maternal uterus, adnexal regions, and pelvic cul-de-sac. Transvaginal technique was performed to assess early pregnancy. COMPARISON:  None. FINDINGS: Intrauterine gestational sac: A single intrauterine gestational sac is identified. Yolk sac:  Yolk sac is present. Embryo:  Fetal pole is demonstrated. Cardiac Activity: Fetal cardiac activity is observed. Heart Rate: 124  bpm CRL:  8.6  mm   6 w   5 d                  [place] EDC: 12/05/2017 Subchorionic hemorrhage:  None visualized. Maternal uterus/adnexae: Uterus is  retroverted. No myometrial mass lesions identified. Two tiny cystic structures are demonstrated within the endometrium separate from the gestational sac. These are measuring less than 4 mm in diameter. Significance is uncertain. Both ovaries are demonstrated. No abnormal adnexal masses. Probable corpus luteal cyst on the left. Small amount of free fluid in the pelvis. IMPRESSION: Single intrauterine pregnancy. Estimated gestational age by crown-rump length is 6 weeks 5 days. No acute complication is demonstrated sonographically. Additional tiny cystic structures demonstrated within the endometrium of nonspecific etiology. Small amount of free fluid in the pelvis is likely physiologic. Electronically Signed   By: Lucienne Capers M.D.   On: 04/16/2017 23:01   US Ob Transvaginal  Result Date: 04/16/2017 CLINICAL DATA:  Right lower quadrant pain today. Previous history of C-section. Estimated gestational age by LMP is 6 weeks 5 days. Positive urine pregnancy test. Quantitative beta HCG is in progress. EXAM: OBSTETRIC <14 WK [place] AND TRANSVAGINAL OB US TECHNIQUE: Both transabdominal and transvaginal ultrasound examinations were performed for complete evaluation of the gestation as well as the maternal uterus, adnexal regions, and pelvic cul-de-sac. Transvaginal technique was performed to assess early pregnancy. COMPARISON:  None. FINDINGS: Intrauterine gestational sac: A single intrauterine gestational sac is identified. Yolk sac:  Yolk sac is present. Embryo:  Fetal pole is demonstrated. Cardiac Activity: Fetal cardiac activity is observed. Heart Rate: 124  bpm CRL:  8.6  mm   6 w   5 d                  [place] EDC: 12/05/2017 Subchorionic hemorrhage:  None visualized. Maternal uterus/adnexae: Uterus is retroverted. No myometrial mass lesions identified. Two tiny cystic structures are demonstrated within the endometrium separate from the gestational sac. These are measuring less than 4 mm in diameter. Significance is  uncertain. Both ovaries are demonstrated. No abnormal adnexal masses. Probable corpus luteal cyst on the left. Small amount of free fluid in the pelvis. IMPRESSION: Single intrauterine pregnancy. Estimated gestational  age by crown-rump length is 6 weeks 5 days. No acute complication is demonstrated sonographically. Additional tiny cystic structures demonstrated within the endometrium of nonspecific etiology. Small amount of free fluid in the pelvis is likely physiologic. Electronically Signed   By: Lucienne Capers M.D.   On: 04/16/2017 23:01    MDM UA, UPT Urine culture CBC, HCG, ABO/Rh Wet prep and gc/chlamydia US OB Transvaginal US OB Comp Less 14 weeks- IUP measuring 6 weeks 5 days with HR of 124 bpm  Assessment and Plan   1. Normal intrauterine pregnancy on prenatal ultrasound in first trimester   2. Abdominal pain in pregnancy, first trimester    -Discharge home in stable condition -Abdominal pain and bleeding precautions discussed -Patient advised to follow-up with [facility] for prenatal care -Patient may return to MAU as needed or if her condition were to change or worsen  Wende Mott CNM 04/16/2017, 11:07 PM

## 2017-04-16 NOTE — MAU Note (Signed)
Found out 2 wks ago I am pregnant. For couple days having pain R lower abd. Today has been constant and stabbing. No bleeding

## 2017-04-18 LAB — CULTURE, OB URINE: Culture: NO GROWTH

## 2017-04-19 LAB — GC/CHLAMYDIA PROBE AMP (~~LOC~~) NOT AT ARMC
CHLAMYDIA, DNA PROBE: NEGATIVE
Neisseria Gonorrhea: NEGATIVE

## 2017-05-05 DIAGNOSIS — Z3201 Encounter for pregnancy test, result positive: Secondary | ICD-10-CM | POA: Diagnosis not present

## 2017-05-13 DIAGNOSIS — Z3689 Encounter for other specified antenatal screening: Secondary | ICD-10-CM | POA: Diagnosis not present

## 2017-05-13 DIAGNOSIS — Z3481 Encounter for supervision of other normal pregnancy, first trimester: Secondary | ICD-10-CM | POA: Diagnosis not present

## 2017-05-13 LAB — OB RESULTS CONSOLE RUBELLA ANTIBODY, IGM: RUBELLA: IMMUNE

## 2017-05-13 LAB — OB RESULTS CONSOLE HEPATITIS B SURFACE ANTIGEN: Hepatitis B Surface Ag: NEGATIVE

## 2017-05-13 LAB — OB RESULTS CONSOLE ANTIBODY SCREEN: Antibody Screen: NEGATIVE

## 2017-05-13 LAB — OB RESULTS CONSOLE ABO/RH: RH Type: POSITIVE

## 2017-05-13 LAB — OB RESULTS CONSOLE GC/CHLAMYDIA
CHLAMYDIA, DNA PROBE: NEGATIVE
GC PROBE AMP, GENITAL: NEGATIVE

## 2017-05-13 LAB — OB RESULTS CONSOLE HIV ANTIBODY (ROUTINE TESTING): HIV: NONREACTIVE

## 2017-05-13 LAB — OB RESULTS CONSOLE RPR: RPR: NONREACTIVE

## 2017-05-18 DIAGNOSIS — Z3682 Encounter for antenatal screening for nuchal translucency: Secondary | ICD-10-CM | POA: Diagnosis not present

## 2017-05-18 DIAGNOSIS — Z3481 Encounter for supervision of other normal pregnancy, first trimester: Secondary | ICD-10-CM | POA: Diagnosis not present

## 2017-05-18 DIAGNOSIS — Z3491 Encounter for supervision of normal pregnancy, unspecified, first trimester: Secondary | ICD-10-CM | POA: Diagnosis not present

## 2017-05-18 DIAGNOSIS — Z113 Encounter for screening for infections with a predominantly sexual mode of transmission: Secondary | ICD-10-CM | POA: Diagnosis not present

## 2017-05-18 DIAGNOSIS — Z36 Encounter for antenatal screening for chromosomal anomalies: Secondary | ICD-10-CM | POA: Diagnosis not present

## 2017-06-22 DIAGNOSIS — Z361 Encounter for antenatal screening for raised alphafetoprotein level: Secondary | ICD-10-CM | POA: Diagnosis not present

## 2017-07-11 DIAGNOSIS — J019 Acute sinusitis, unspecified: Secondary | ICD-10-CM | POA: Diagnosis not present

## 2017-07-11 DIAGNOSIS — J209 Acute bronchitis, unspecified: Secondary | ICD-10-CM | POA: Diagnosis not present

## 2017-07-11 DIAGNOSIS — Z3A19 19 weeks gestation of pregnancy: Secondary | ICD-10-CM | POA: Diagnosis not present

## 2017-07-12 DIAGNOSIS — Z363 Encounter for antenatal screening for malformations: Secondary | ICD-10-CM | POA: Diagnosis not present

## 2017-07-27 DIAGNOSIS — Z362 Encounter for other antenatal screening follow-up: Secondary | ICD-10-CM | POA: Diagnosis not present

## 2017-09-09 DIAGNOSIS — Z3689 Encounter for other specified antenatal screening: Secondary | ICD-10-CM | POA: Diagnosis not present

## 2017-09-21 DIAGNOSIS — Z3689 Encounter for other specified antenatal screening: Secondary | ICD-10-CM | POA: Diagnosis not present

## 2017-09-21 DIAGNOSIS — Z23 Encounter for immunization: Secondary | ICD-10-CM | POA: Diagnosis not present

## 2017-10-08 DIAGNOSIS — Z3A31 31 weeks gestation of pregnancy: Secondary | ICD-10-CM | POA: Diagnosis not present

## 2017-10-08 DIAGNOSIS — O99013 Anemia complicating pregnancy, third trimester: Secondary | ICD-10-CM | POA: Diagnosis not present

## 2017-10-12 ENCOUNTER — Other Ambulatory Visit: Payer: Self-pay | Admitting: Obstetrics and Gynecology

## 2017-10-12 MED ORDER — SODIUM CHLORIDE 0.9 % IV SOLN: 750.0000 mg | Freq: Once | INTRAVENOUS | Status: DC

## 2017-10-21 ENCOUNTER — Ambulatory Visit (HOSPITAL_COMMUNITY)
Admission: RE | Admit: 2017-10-21 | Discharge: 2017-10-21 | Disposition: A | Discharge status: Home / Self Care | Payer: 59 | Source: Ambulatory Visit | Attending: Obstetrics and Gynecology | Admitting: Obstetrics and Gynecology

## 2017-10-21 DIAGNOSIS — D464 Refractory anemia, unspecified: Secondary | ICD-10-CM | POA: Insufficient documentation

## 2017-10-21 DIAGNOSIS — O99019 Anemia complicating pregnancy, unspecified trimester: Secondary | ICD-10-CM | POA: Insufficient documentation

## 2017-10-21 MED ORDER — SODIUM CHLORIDE 0.9 % IV SOLN: Freq: Once | INTRAVENOUS | Status: DC

## 2017-10-21 MED ORDER — SODIUM CHLORIDE 0.9 % IV SOLN
750.0000 mg | Freq: Once | INTRAVENOUS | Status: AC
  Administered 2017-10-21: 750 mg via INTRAVENOUS
  Filled 2017-10-21: qty 15

## 2017-10-21 NOTE — Progress Notes (Signed)
PATIENT CARE CENTER NOTE  Diagnosis: Iron Deficiency Anemia    Provider: Dr. Ronita Hipps   Procedure: IV Injectafer    Note: Patient received infusion of Injectafer. Patient tolerated infusion well with no adverse reaction. Monitored patient for 30 minutes after infusion and vital signs taken. Patient alert, oriented and ambulatory at discharge.

## 2017-10-21 NOTE — Discharge Instructions (Signed)
Ferric carboxymaltose injection What is this medicine? FERRIC CARBOXYMALTOSE (ferr-ik car-box-ee-mol-toes) is an iron complex. Iron is used to make healthy red blood cells, which carry oxygen and nutrients throughout the body. This medicine is used to treat anemia in people with chronic kidney disease or people who cannot take iron by mouth. This medicine may be used for other purposes; ask your health care provider or pharmacist if you have questions. COMMON BRAND NAME(S): Injectafer What should I tell my health care provider before I take this medicine? They need to know if you have any of these conditions: -anemia not caused by low iron levels -high levels of iron in the blood -liver disease -an unusual or allergic reaction to iron, other medicines, foods, dyes, or preservatives -pregnant or trying to get pregnant -breast-feeding How should I use this medicine? This medicine is for infusion into a vein. It is given by a health care professional in a hospital or clinic setting. Talk to your pediatrician regarding the use of this medicine in children. Special care may be needed. Overdosage: If you think you have taken too much of this medicine contact a poison control center or emergency room at once. NOTE: This medicine is only for you. Do not share this medicine with others. What if I miss a dose? It is important not to miss your dose. Call your doctor or health care professional if you are unable to keep an appointment. What may interact with this medicine? Do not take this medicine with any of the following medications: -deferoxamine -dimercaprol -other iron products This medicine may also interact with the following medications: -chloramphenicol -deferasirox This list may not describe all possible interactions. Give your health care provider a list of all the medicines, herbs, non-prescription drugs, or dietary supplements you use. Also tell them if you smoke, drink alcohol, or use  illegal drugs. Some items may interact with your medicine. What should I watch for while using this medicine? Visit your doctor or health care professional regularly. Tell your doctor if your symptoms do not start to get better or if they get worse. You may need blood work done while you are taking this medicine. You may need to follow a special diet. Talk to your doctor. Foods that contain iron include: whole grains/cereals, dried fruits, beans, or peas, leafy green vegetables, and organ meats (liver, kidney). What side effects may I notice from receiving this medicine? Side effects that you should report to your doctor or health care professional as soon as possible: -allergic reactions like skin rash, itching or hives, swelling of the face, lips, or tongue -breathing problems -changes in blood pressure -feeling faint or lightheaded, falls -flushing, sweating, or hot feelings Side effects that usually do not require medical attention (report to your doctor or health care professional if they continue or are bothersome): -changes in taste -constipation -dizziness -headache -nausea -pain, redness, or irritation at site where injected -vomiting This list may not describe all possible side effects. Call your doctor for medical advice about side effects. You may report side effects to FDA at 1-800-FDA-1088. Where should I keep my medicine? This drug is given in a hospital or clinic and will not be stored at home. NOTE: This sheet is a summary. It may not cover all possible information. If you have questions about this medicine, talk to your doctor, pharmacist, or health care provider.  2018 Elsevier/Gold Standard (2015-05-02 11:20:47)  

## 2017-10-22 ENCOUNTER — Ambulatory Visit: Payer: 59 | Attending: Obstetrics and Gynecology

## 2017-10-22 DIAGNOSIS — M5441 Lumbago with sciatica, right side: Secondary | ICD-10-CM | POA: Insufficient documentation

## 2017-10-22 DIAGNOSIS — R293 Abnormal posture: Secondary | ICD-10-CM | POA: Insufficient documentation

## 2017-10-22 NOTE — Therapy (Signed)
[place] [facility] [address] [place], [place], [address] Phone: [phone]   Fax:  [phone]  Patient Details  Name: Shelly [name] MRN: [id number] Date of Birth: 1985/07/26 Referring Provider:  Brien Few, MD  Encounter Date: 10/22/2017  4th pregnancy, has never had pain with pregnancy before but is having low back pain on her R side sometimes has pubic symphysis pain as well. Is 34 weeks. No other health conditions.   Pain is at a 5/10 right now. It is stiff in the morning but worsens throughout the course of the day. Has been told she has a diastasis recti and did have a small hernia but it has not been addressed  TTP through R Glute min, R paraspinals, Piriformis and deep hip ER.    Complaint _LBP affecting Pregnancy__________________ Past Medical Hx:  _3 prior healthy pregnancies, no other relevant PMH_______________ Injury Date:__insidious onset during pregnancy______________________________  Pain Scale: __5/10 __________ Patient's phone number: [phone]  Hx (this occurrence):  4th pregnancy, has never had pain with pregnancy before but is having low back pain on her R side sometimes has pubic symphysis pain as well. Is 34 weeks. No other health conditions.   Pain is at a 5/10 right now. It is stiff in the morning but worsens throughout the course of the day. Has been told she has a diastasis recti and did have a small hernia but it has not been addressed  Assessment: TTP through R Glute min, R paraspinals, Piriformis and deep hip ER. Demonstrates hyperlordotic posture. Pt. Is likely suffering from myalgia due to spasms surrounding the pelvis secondary to hyperlordotic posture increased from late-term pregnancy. She was educted on how to perform self TP release and given education on posture, corrective exercises, and supportive garments to help relieve stress on the LB and decrease pain. If Pt. Is unable to  self-manage pain with education provided she will benefit from a referral and full PT eval and manual treatment to resolve pain affecting her pregnancy and ability to work.     Recommendations:    Comments: Pt. Will attempt to self-manage pain with education provided but has been encouraged to seek PT referral if pain does not resolve or become manageable with recommendations provided.   []  Patient would benefit from an MD referral []  Patient would benefit from a full PT/OT/ SLP evaluation and treatment. [x]  No intervention recommended at this time.           Willa Rough DPT, ATC Willa Rough 10/25/2017, 9:36 AM  [place] [facility] [address] [place], [place], [address] Phone: [phone]   Fax:  [phone]

## 2017-10-22 NOTE — Patient Instructions (Addendum)
      Position either the theracane or the lacrosse ball where it is putting pressure on the tender muscle to the point were it is uncomfortable but bearable. Take deep belly breaths to allow the muscle to release.   Do this to your Right low back and around your R hip       Look into buying a Ab Wrap by [employer] to support you both during pregnancy and postpartum. Look up mother roasting and belly wrapping to learn why it is beneficial postpartum.

## 2017-10-26 LAB — OB RESULTS CONSOLE GBS: GBS: POSITIVE

## 2017-10-29 ENCOUNTER — Ambulatory Visit (HOSPITAL_COMMUNITY)
Admission: RE | Admit: 2017-10-29 | Discharge: 2017-10-29 | Disposition: A | Discharge status: Home / Self Care | Payer: 59 | Source: Ambulatory Visit | Attending: Obstetrics and Gynecology | Admitting: Obstetrics and Gynecology

## 2017-10-29 DIAGNOSIS — O99019 Anemia complicating pregnancy, unspecified trimester: Secondary | ICD-10-CM | POA: Diagnosis not present

## 2017-10-29 DIAGNOSIS — D464 Refractory anemia, unspecified: Secondary | ICD-10-CM | POA: Diagnosis not present

## 2017-10-29 MED ORDER — SODIUM CHLORIDE 0.9 % IV SOLN
Freq: Once | INTRAVENOUS | Status: AC
  Administered 2017-10-29: 10 mL/h via INTRAVENOUS

## 2017-10-29 MED ORDER — SODIUM CHLORIDE 0.9 % IV SOLN
750.0000 mg | Freq: Once | INTRAVENOUS | Status: AC
  Administered 2017-10-29: 750 mg via INTRAVENOUS
  Filled 2017-10-29: qty 15

## 2017-10-29 NOTE — Progress Notes (Signed)
PATIENT CARE CENTER NOTE  Diagnosis: Iron Deficiency Anemia    Provider: Dr. Ronita Hipps   Procedure: IV Injectafer    Note: Patient received infusion of Injectafer. Patient tolerated infusion well with no adverse reaction. Monitored patient for 30 minutes after infusion and vital signs taken. Patient alert, oriented and ambulatory at discharge.

## 2017-10-29 NOTE — Discharge Instructions (Signed)
Ferric carboxymaltose injection What is this medicine? FERRIC CARBOXYMALTOSE (ferr-ik car-box-ee-mol-toes) is an iron complex. Iron is used to make healthy red blood cells, which carry oxygen and nutrients throughout the body. This medicine is used to treat anemia in people with chronic kidney disease or people who cannot take iron by mouth. This medicine may be used for other purposes; ask your health care provider or pharmacist if you have questions. COMMON BRAND NAME(S): Injectafer What should I tell my health care provider before I take this medicine? They need to know if you have any of these conditions: -anemia not caused by low iron levels -high levels of iron in the blood -liver disease -an unusual or allergic reaction to iron, other medicines, foods, dyes, or preservatives -pregnant or trying to get pregnant -breast-feeding How should I use this medicine? This medicine is for infusion into a vein. It is given by a health care professional in a hospital or clinic setting. Talk to your pediatrician regarding the use of this medicine in children. Special care may be needed. Overdosage: If you think you have taken too much of this medicine contact a poison control center or emergency room at once. NOTE: This medicine is only for you. Do not share this medicine with others. What if I miss a dose? It is important not to miss your dose. Call your doctor or health care professional if you are unable to keep an appointment. What may interact with this medicine? Do not take this medicine with any of the following medications: -deferoxamine -dimercaprol -other iron products This medicine may also interact with the following medications: -chloramphenicol -deferasirox This list may not describe all possible interactions. Give your health care provider a list of all the medicines, herbs, non-prescription drugs, or dietary supplements you use. Also tell them if you smoke, drink alcohol, or use  illegal drugs. Some items may interact with your medicine. What should I watch for while using this medicine? Visit your doctor or health care professional regularly. Tell your doctor if your symptoms do not start to get better or if they get worse. You may need blood work done while you are taking this medicine. You may need to follow a special diet. Talk to your doctor. Foods that contain iron include: whole grains/cereals, dried fruits, beans, or peas, leafy green vegetables, and organ meats (liver, kidney). What side effects may I notice from receiving this medicine? Side effects that you should report to your doctor or health care professional as soon as possible: -allergic reactions like skin rash, itching or hives, swelling of the face, lips, or tongue -breathing problems -changes in blood pressure -feeling faint or lightheaded, falls -flushing, sweating, or hot feelings Side effects that usually do not require medical attention (report to your doctor or health care professional if they continue or are bothersome): -changes in taste -constipation -dizziness -headache -nausea -pain, redness, or irritation at site where injected -vomiting This list may not describe all possible side effects. Call your doctor for medical advice about side effects. You may report side effects to FDA at 1-800-FDA-1088. Where should I keep my medicine? This drug is given in a hospital or clinic and will not be stored at home. NOTE: This sheet is a summary. It may not cover all possible information. If you have questions about this medicine, talk to your doctor, pharmacist, or health care provider.  2018 Elsevier/Gold Standard (2015-05-02 11:20:47)  

## 2017-11-03 DIAGNOSIS — Z3685 Encounter for antenatal screening for Streptococcus B: Secondary | ICD-10-CM | POA: Diagnosis not present

## 2017-11-03 DIAGNOSIS — O99013 Anemia complicating pregnancy, third trimester: Secondary | ICD-10-CM | POA: Diagnosis not present

## 2017-11-03 DIAGNOSIS — Z3A35 35 weeks gestation of pregnancy: Secondary | ICD-10-CM | POA: Diagnosis not present

## 2017-11-06 DIAGNOSIS — Z3483 Encounter for supervision of other normal pregnancy, third trimester: Secondary | ICD-10-CM | POA: Diagnosis not present

## 2017-11-06 DIAGNOSIS — Z3482 Encounter for supervision of other normal pregnancy, second trimester: Secondary | ICD-10-CM | POA: Diagnosis not present

## 2017-11-12 ENCOUNTER — Telehealth (HOSPITAL_COMMUNITY): Payer: Self-pay | Admitting: *Deleted

## 2017-11-12 ENCOUNTER — Encounter (HOSPITAL_COMMUNITY): Payer: Self-pay

## 2017-11-12 NOTE — Telephone Encounter (Signed)
Preadmission screen  

## 2017-11-15 ENCOUNTER — Telehealth (HOSPITAL_COMMUNITY): Payer: Self-pay | Admitting: *Deleted

## 2017-11-15 NOTE — Telephone Encounter (Signed)
Preadmission screen  

## 2017-11-16 ENCOUNTER — Telehealth (HOSPITAL_COMMUNITY): Payer: Self-pay | Admitting: *Deleted

## 2017-11-16 DIAGNOSIS — M5489 Other dorsalgia: Secondary | ICD-10-CM | POA: Diagnosis not present

## 2017-11-16 DIAGNOSIS — Z3482 Encounter for supervision of other normal pregnancy, second trimester: Secondary | ICD-10-CM | POA: Diagnosis not present

## 2017-11-16 DIAGNOSIS — Z3483 Encounter for supervision of other normal pregnancy, third trimester: Secondary | ICD-10-CM | POA: Diagnosis not present

## 2017-11-16 NOTE — Telephone Encounter (Signed)
Preadmission screen  

## 2017-11-17 ENCOUNTER — Encounter (HOSPITAL_COMMUNITY): Payer: Self-pay

## 2017-11-17 DIAGNOSIS — O99013 Anemia complicating pregnancy, third trimester: Secondary | ICD-10-CM | POA: Diagnosis not present

## 2017-11-17 DIAGNOSIS — R350 Frequency of micturition: Secondary | ICD-10-CM | POA: Diagnosis not present

## 2017-11-17 DIAGNOSIS — Z3A37 37 weeks gestation of pregnancy: Secondary | ICD-10-CM | POA: Diagnosis not present

## 2017-11-18 ENCOUNTER — Other Ambulatory Visit: Payer: Self-pay | Admitting: Obstetrics and Gynecology

## 2017-11-20 ENCOUNTER — Inpatient Hospital Stay (HOSPITAL_COMMUNITY)
Admission: AD | Admit: 2017-11-20 | Discharge: 2017-11-20 | Disposition: A | Discharge status: Home / Self Care | Payer: 59 | Source: Ambulatory Visit | Attending: Obstetrics and Gynecology | Admitting: Obstetrics and Gynecology

## 2017-11-20 ENCOUNTER — Encounter (HOSPITAL_COMMUNITY): Payer: Self-pay | Admitting: *Deleted

## 2017-11-20 DIAGNOSIS — O4703 False labor before 37 completed weeks of gestation, third trimester: Secondary | ICD-10-CM | POA: Diagnosis not present

## 2017-11-20 DIAGNOSIS — O471 False labor at or after 37 completed weeks of gestation: Secondary | ICD-10-CM | POA: Diagnosis not present

## 2017-11-20 DIAGNOSIS — Z3A00 Weeks of gestation of pregnancy not specified: Secondary | ICD-10-CM | POA: Insufficient documentation

## 2017-11-20 DIAGNOSIS — Z3A37 37 weeks gestation of pregnancy: Secondary | ICD-10-CM | POA: Diagnosis not present

## 2017-11-20 NOTE — Discharge Instructions (Signed)
Braxton Hicks Contractions °Contractions of the uterus can occur throughout pregnancy, but they are not always a sign that you are in labor. You may have practice contractions called Braxton Hicks contractions. These false labor contractions are sometimes confused with true labor. °What are Braxton Hicks contractions? °Braxton Hicks contractions are tightening movements that occur in the muscles of the uterus before labor. Unlike true labor contractions, these contractions do not result in opening (dilation) and thinning of the cervix. Toward the end of pregnancy (32-34 weeks), Braxton Hicks contractions can happen more often and may become stronger. These contractions are sometimes difficult to tell apart from true labor because they can be very uncomfortable. You should not feel embarrassed if you go to the hospital with false labor. °Sometimes, the only way to tell if you are in true labor is for your health care provider to look for changes in the cervix. The health care provider will do a physical exam and may monitor your contractions. If you are not in true labor, the exam should show that your cervix is not dilating and your water has not broken. °If there are other health problems associated with your pregnancy, it is completely safe for you to be sent home with false labor. You may continue to have Braxton Hicks contractions until you go into true labor. °How to tell the difference between true labor and false labor °True labor °· Contractions last 30-70 seconds. °· Contractions become very regular. °· Discomfort is usually felt in the top of the uterus, and it spreads to the lower abdomen and low back. °· Contractions do not go away with walking. °· Contractions usually become more intense and increase in frequency. °· The cervix dilates and gets thinner. °False labor °· Contractions are usually shorter and not as strong as true labor contractions. °· Contractions are usually irregular. °· Contractions  are often felt in the front of the lower abdomen and in the groin. °· Contractions may go away when you walk around or change positions while lying down. °· Contractions get weaker and are shorter-lasting as time goes on. °· The cervix usually does not dilate or become thin. °Follow these instructions at home: °· Take over-the-counter and prescription medicines only as told by your health care provider. °· Keep up with your usual exercises and follow other instructions from your health care provider. °· Eat and drink lightly if you think you are going into labor. °· If Braxton Hicks contractions are making you uncomfortable: °? Change your position from lying down or resting to walking, or change from walking to resting. °? Sit and rest in a tub of warm water. °? Drink enough fluid to keep your urine pale yellow. Dehydration may cause these contractions. °? Do slow and deep breathing several times an hour. °· Keep all follow-up prenatal visits as told by your health care provider. This is important. °Contact a health care provider if: °· You have a fever. °· You have continuous pain in your abdomen. °Get help right away if: °· Your contractions become stronger, more regular, and closer together. °· You have fluid leaking or gushing from your vagina. °· You pass blood-tinged mucus (bloody show). °· You have bleeding from your vagina. °· You have low back pain that you never had before. °· You feel your baby’s head pushing down and causing pelvic pressure. °· Your baby is not moving inside you as much as it used to. °Summary °· Contractions that occur before labor are called Braxton   Hicks contractions, false labor, or practice contractions. °· Braxton Hicks contractions are usually shorter, weaker, farther apart, and less regular than true labor contractions. True labor contractions usually become progressively stronger and regular and they become more frequent. °· Manage discomfort from Braxton Hicks contractions by  changing position, resting in a warm bath, drinking plenty of water, or practicing deep breathing. °This information is not intended to replace advice given to you by your health care provider. Make sure you discuss any questions you have with your health care provider. °Document Released: 08/13/2016 Document Revised: 08/13/2016 Document Reviewed: 08/13/2016 °Elsevier Interactive Patient Education © 2018 [employer] ° °Fetal Movement Counts °Patient Name: ________________________________________________ Patient Due Date: ____________________ °What is a fetal movement count? °A fetal movement count is the number of times that you feel your baby move during a certain amount of time. This may also be called a fetal kick count. A fetal movement count is recommended for every pregnant woman. You may be asked to start counting fetal movements as early as week 28 of your pregnancy. °Pay attention to when your baby is most active. You may notice your baby's sleep and wake cycles. You may also notice things that make your baby move more. You should do a fetal movement count: °· When your baby is normally most active. °· At the same time each day. ° °A good time to count movements is while you are resting, after having something to eat and drink. °How do I count fetal movements? °1. Find a quiet, comfortable area. Sit, or lie down on your side. °2. Write down the date, the start time and stop time, and the number of movements that you felt between those two times. Take this information with you to your health care visits. °3. For 2 hours, count kicks, flutters, swishes, rolls, and jabs. You should feel at least 10 movements during 2 hours. °4. You may stop counting after you have felt 10 movements. °5. If you do not feel 10 movements in 2 hours, have something to eat and drink. Then, keep resting and counting for 1 hour. If you feel at least 4 movements during that hour, you may stop counting. °Contact a health care  provider if: °· You feel fewer than 4 movements in 2 hours. °· Your baby is not moving like he or she usually does. °Date: ____________ Start time: ____________ Stop time: ____________ Movements: ____________ °Date: ____________ Start time: ____________ Stop time: ____________ Movements: ____________ °Date: ____________ Start time: ____________ Stop time: ____________ Movements: ____________ °Date: ____________ Start time: ____________ Stop time: ____________ Movements: ____________ °Date: ____________ Start time: ____________ Stop time: ____________ Movements: ____________ °Date: ____________ Start time: ____________ Stop time: ____________ Movements: ____________ °Date: ____________ Start time: ____________ Stop time: ____________ Movements: ____________ °Date: ____________ Start time: ____________ Stop time: ____________ Movements: ____________ °Date: ____________ Start time: ____________ Stop time: ____________ Movements: ____________ °This information is not intended to replace advice given to you by your health care provider. Make sure you discuss any questions you have with your health care provider. °Document Released: 04/29/2006 Document Revised: 11/27/2015 Document Reviewed: 05/09/2015 °Elsevier Interactive Patient Education © 2018 [employer] ° °

## 2017-11-20 NOTE — Progress Notes (Addendum)
Charted on wrong pt.

## 2017-11-20 NOTE — MAU Note (Signed)
Ctx this AM but they have spaced out and decreased in intensity  Still having cramping and pressure  No LOF, no bleeding, +FM

## 2017-11-20 NOTE — MAU Provider Note (Signed)
Presents with irregular contractions Good FM. No bleeding or LOF BP 113/82 (BP Location: Right Arm)   Pulse (!) 118   Temp 98.1 F (36.7 C) (Oral)   Resp 18   Wt 67.1 kg   LMP 02/28/2017   BMI 27.96 kg/m   VE: closed thick per RN Category 1 tracing with irregular contractions DC home with labor precautions

## 2017-11-26 ENCOUNTER — Encounter (HOSPITAL_COMMUNITY): Payer: Self-pay

## 2017-11-26 ENCOUNTER — Encounter (HOSPITAL_COMMUNITY)
Admission: RE | Admit: 2017-11-26 | Discharge: 2017-11-26 | Disposition: A | Discharge status: Home / Self Care | Payer: 59 | Source: Ambulatory Visit | Attending: Obstetrics and Gynecology | Admitting: Obstetrics and Gynecology

## 2017-11-26 DIAGNOSIS — O99824 Streptococcus B carrier state complicating childbirth: Secondary | ICD-10-CM | POA: Diagnosis not present

## 2017-11-26 DIAGNOSIS — Z3A39 39 weeks gestation of pregnancy: Secondary | ICD-10-CM | POA: Diagnosis not present

## 2017-11-26 DIAGNOSIS — O34211 Maternal care for low transverse scar from previous cesarean delivery: Secondary | ICD-10-CM | POA: Diagnosis not present

## 2017-11-26 DIAGNOSIS — O9081 Anemia of the puerperium: Secondary | ICD-10-CM | POA: Diagnosis not present

## 2017-11-26 DIAGNOSIS — D62 Acute posthemorrhagic anemia: Secondary | ICD-10-CM | POA: Diagnosis not present

## 2017-11-26 HISTORY — DX: Anemia, unspecified: D64.9

## 2017-11-26 LAB — CBC
HCT: 37.7 % (ref 36.0–46.0)
HEMOGLOBIN: 12.5 g/dL (ref 12.0–15.0)
MCH: 29.4 pg (ref 26.0–34.0)
MCHC: 33.2 g/dL (ref 30.0–36.0)
MCV: 88.7 fL (ref 78.0–100.0)
Platelets: 201 10*3/uL (ref 150–400)
RBC: 4.25 MIL/uL (ref 3.87–5.11)
RDW: 19.1 % — ABNORMAL HIGH (ref 11.5–15.5)
WBC: 8.2 10*3/uL (ref 4.0–10.5)

## 2017-11-26 LAB — TYPE AND SCREEN
ABO/RH(D): O POS
ANTIBODY SCREEN: NEGATIVE

## 2017-11-26 NOTE — Patient Instructions (Signed)
Shelly [name]  11/26/2017   Your procedure is scheduled on:  11/29/2017  Enter through the Main Entrance of [facility] [place] up the phone at the desk and dial [phone]  Call this number if you have problems the morning of [phone]  Remember:   Do not eat food:(After Midnight) Desps de medianoche.  Do not drink clear liquids: (After Midnight) Desps de medianoche.  Take these medicines the morning of surgery with A SIP OF WATER: bring your inhaler   Do not wear jewelry, make-up or nail polish.  Do not wear lotions, powders, or perfumes. Do not wear deodorant.  Do not shave 48 hours prior to surgery.  Do not bring valuables to the hospital.  [facility] is not   responsible for any belongings or valuables brought to the hospital.  Contacts, dentures or bridgework may not be worn into surgery.  Leave suitcase in the car. After surgery it may be brought to your room.  For patients admitted to the hospital, checkout time is 11:00 AM the day of              discharge.    N/A   Please read over the following fact sheets that you were given:   Surgical Site Infection Prevention

## 2017-11-27 LAB — RPR: RPR: NONREACTIVE

## 2017-11-28 NOTE — H&P (Signed)
Shelly [name] is a 32 y.o. female presenting for rpt csection.Previous csection x 3. OB History    Gravida  4   Para  3   Term  3   Preterm  0   AB  0   Living  3     SAB  0   TAB  0   Ectopic  0   Multiple  0   Live Births             Past Medical History:  Diagnosis Date  . Anemia   . Anxiety   . Chronic kidney disease   . Endometriosis of pelvis 04/21/2012   Diagnosed on pathology after operative laparoscopy on 04/21/2012   . PONV (postoperative nausea and vomiting)    Past Surgical History:  Procedure Laterality Date  . CESAREAN SECTION  09/17/00, 08/31/02, 06/14/08   x 3  . CHROMOPERTUBATION  04/21/2012   Procedure: CHROMOPERTUBATION;  Surgeon: Osborne Oman, MD;  Location: [facility];  Service: Gynecology;  Laterality: N/A;  . LAPAROSCOPIC LYSIS OF ADHESIONS  04/21/2012   Procedure: LAPAROSCOPIC LYSIS OF ADHESIONS;  Surgeon: Osborne Oman, MD;  Location: [facility];  Service: Gynecology;  Laterality: N/A;  . LAPAROSCOPY  04/21/2012   Procedure: LAPAROSCOPY OPERATIVE;  Surgeon: Osborne Oman, MD;  Location: [facility];  Service: Gynecology;  Laterality: N/A;  Peritoneal biopsies   Family History: family history includes Breast cancer in her mother and paternal aunt; Colon cancer in her other; Heart attack in her father. Social History:  reports that she has never smoked. She has never used smokeless tobacco. She reports that she does not drink alcohol or use drugs.     Maternal Diabetes: No Genetic Screening: Normal Maternal Ultrasounds/Referrals: Normal Fetal Ultrasounds or other Referrals:  None Maternal Substance Abuse:  No Significant Maternal Medications:  None Significant Maternal Lab Results:  None Other Comments:  None  Review of Systems  Constitutional: Negative.   All other systems reviewed and are negative.  Maternal Medical History:  Contractions: Frequency: rare.   Perceived severity is mild.    Fetal activity: Perceived fetal activity is normal.    Last perceived fetal movement was within the past hour.    Prenatal complications: no prenatal complications Prenatal Complications - Diabetes: none.      Last menstrual period 02/28/2017. Maternal Exam:  Uterine Assessment: Contraction strength is mild.  Contraction frequency is rare.   Abdomen: Patient reports no abdominal tenderness. Surgical scars: low transverse.   Fetal presentation: vertex  Introitus: Normal vulva. Normal vagina.  Ferning test: not done.  Nitrazine test: not done. Amniotic fluid character: not assessed.  Pelvis: questionable for delivery.   Cervix: Cervix evaluated by digital exam.     Physical Exam  Nursing note and vitals reviewed. Constitutional: She is oriented to person, place, and time. She appears well-developed and well-nourished.  HENT:  Head: Normocephalic and atraumatic.  Neck: Normal range of motion. Neck supple.  Cardiovascular: Normal rate and regular rhythm.  Respiratory: Effort normal and breath sounds normal.  GI: Soft. Bowel sounds are normal.  Genitourinary: Vagina normal.  Musculoskeletal: Normal range of motion.  Neurological: She is alert and oriented to person, place, and time. She has normal reflexes.  Skin: Skin is warm and dry.  Psychiatric: She has a normal mood and affect.    Prenatal labs: ABO, Rh: --/--/O POS (08/16 1054) Antibody: NEG (08/16 1054) Rubella: Immune (01/31 0000) RPR: Non Reactive (08/16 1054)  HBsAg: Negative (01/31 0000)  HIV: Non-reactive (01/31 0000)  GBS: Positive (07/16 0000)   Assessment/Plan: 39 week IUP Previous csection x 3 Rpt csection. Risks vs benefits of surgery discussed.  Consent done.   Shelly [name] J 11/28/2017, 8:35 PM

## 2017-11-29 ENCOUNTER — Inpatient Hospital Stay (HOSPITAL_COMMUNITY): Payer: 59 | Admitting: Anesthesiology

## 2017-11-29 ENCOUNTER — Inpatient Hospital Stay (HOSPITAL_COMMUNITY)
Admission: RE | Admit: 2017-11-29 | Discharge: 2017-12-02 | DRG: 787 | Disposition: A | Discharge status: Home / Self Care | Payer: 59 | Attending: Obstetrics and Gynecology | Admitting: Obstetrics and Gynecology

## 2017-11-29 ENCOUNTER — Encounter (HOSPITAL_COMMUNITY): Payer: Self-pay | Admitting: General Practice

## 2017-11-29 ENCOUNTER — Encounter (HOSPITAL_COMMUNITY)
Admission: RE | Disposition: A | Discharge status: Home / Self Care | Payer: Self-pay | Source: Home / Self Care | Attending: Obstetrics and Gynecology

## 2017-11-29 DIAGNOSIS — O9081 Anemia of the puerperium: Secondary | ICD-10-CM | POA: Diagnosis not present

## 2017-11-29 DIAGNOSIS — Z3A39 39 weeks gestation of pregnancy: Secondary | ICD-10-CM

## 2017-11-29 DIAGNOSIS — D500 Iron deficiency anemia secondary to blood loss (chronic): Secondary | ICD-10-CM

## 2017-11-29 DIAGNOSIS — O34211 Maternal care for low transverse scar from previous cesarean delivery: Secondary | ICD-10-CM | POA: Diagnosis present

## 2017-11-29 DIAGNOSIS — O99824 Streptococcus B carrier state complicating childbirth: Secondary | ICD-10-CM | POA: Diagnosis present

## 2017-11-29 DIAGNOSIS — D62 Acute posthemorrhagic anemia: Secondary | ICD-10-CM | POA: Diagnosis not present

## 2017-11-29 DIAGNOSIS — O34219 Maternal care for unspecified type scar from previous cesarean delivery: Secondary | ICD-10-CM | POA: Diagnosis present

## 2017-11-29 SURGERY — Surgical Case
Anesthesia: Spinal | Site: Abdomen | Wound class: Clean Contaminated

## 2017-11-29 MED ORDER — LACTATED RINGERS IV SOLN: INTRAVENOUS | Status: DC

## 2017-11-29 MED ORDER — FENTANYL CITRATE (PF) 100 MCG/2ML IJ SOLN
INTRAMUSCULAR | Status: AC
  Filled 2017-11-29: qty 2

## 2017-11-29 MED ORDER — SIMETHICONE 80 MG PO CHEW
80.0000 mg | CHEWABLE_TABLET | Freq: Three times a day (TID) | ORAL | Status: DC
  Administered 2017-11-29 – 2017-12-02 (×7): 80 mg via ORAL
  Filled 2017-11-29 (×7): qty 1

## 2017-11-29 MED ORDER — SODIUM CHLORIDE 0.9 % IR SOLN
Status: DC | PRN
  Administered 2017-11-29: 1000 mL

## 2017-11-29 MED ORDER — WITCH HAZEL-GLYCERIN EX PADS: 1.0000 "application " | MEDICATED_PAD | CUTANEOUS | Status: DC | PRN

## 2017-11-29 MED ORDER — OXYCODONE HCL 5 MG/5ML PO SOLN: 5.0000 mg | Freq: Once | ORAL | Status: DC | PRN

## 2017-11-29 MED ORDER — MORPHINE SULFATE (PF) 0.5 MG/ML IJ SOLN
INTRAMUSCULAR | Status: DC | PRN
  Administered 2017-11-29: .15 mg via INTRATHECAL

## 2017-11-29 MED ORDER — ONDANSETRON HCL 4 MG/2ML IJ SOLN: 4.0000 mg | Freq: Three times a day (TID) | INTRAMUSCULAR | Status: DC | PRN

## 2017-11-29 MED ORDER — TRANEXAMIC ACID 1000 MG/10ML IV SOLN
1000.0000 mg | Freq: Once | INTRAVENOUS | Status: AC
  Administered 2017-11-29: 1000 mg via INTRAVENOUS
  Filled 2017-11-29: qty 10

## 2017-11-29 MED ORDER — SCOPOLAMINE 1 MG/3DAYS TD PT72
MEDICATED_PATCH | TRANSDERMAL | Status: AC
  Filled 2017-11-29: qty 1

## 2017-11-29 MED ORDER — OXYTOCIN 10 UNIT/ML IJ SOLN
INTRAMUSCULAR | Status: AC
  Filled 2017-11-29: qty 4

## 2017-11-29 MED ORDER — MEPERIDINE HCL 25 MG/ML IJ SOLN: 6.2500 mg | INTRAMUSCULAR | Status: DC | PRN

## 2017-11-29 MED ORDER — BUPIVACAINE IN DEXTROSE 0.75-8.25 % IT SOLN
INTRATHECAL | Status: DC | PRN
  Administered 2017-11-29: 1.6 mL via INTRATHECAL

## 2017-11-29 MED ORDER — ONDANSETRON HCL 4 MG/2ML IJ SOLN
INTRAMUSCULAR | Status: AC
  Filled 2017-11-29: qty 2

## 2017-11-29 MED ORDER — OXYCODONE-ACETAMINOPHEN 5-325 MG PO TABS
2.0000 | ORAL_TABLET | ORAL | Status: DC | PRN
  Administered 2017-12-01: 2 via ORAL
  Filled 2017-11-29: qty 2

## 2017-11-29 MED ORDER — COCONUT OIL OIL
1.0000 "application " | TOPICAL_OIL | Status: DC | PRN
  Administered 2017-11-30: 1 via TOPICAL
  Filled 2017-11-29: qty 120

## 2017-11-29 MED ORDER — PHENYLEPHRINE 40 MCG/ML (10ML) SYRINGE FOR IV PUSH (FOR BLOOD PRESSURE SUPPORT)
PREFILLED_SYRINGE | INTRAVENOUS | Status: AC
  Filled 2017-11-29: qty 10

## 2017-11-29 MED ORDER — SCOPOLAMINE 1 MG/3DAYS TD PT72
1.0000 | MEDICATED_PATCH | Freq: Once | TRANSDERMAL | Status: AC
  Administered 2017-11-29: 1.5 mg via TRANSDERMAL

## 2017-11-29 MED ORDER — HYDROMORPHONE HCL 1 MG/ML IJ SOLN: 0.2500 mg | INTRAMUSCULAR | Status: DC | PRN

## 2017-11-29 MED ORDER — SIMETHICONE 80 MG PO CHEW
80.0000 mg | CHEWABLE_TABLET | ORAL | Status: DC
  Administered 2017-11-30 – 2017-12-02 (×3): 80 mg via ORAL
  Filled 2017-11-29 (×3): qty 1

## 2017-11-29 MED ORDER — NALBUPHINE HCL 10 MG/ML IJ SOLN: 5.0000 mg | Freq: Once | INTRAMUSCULAR | Status: DC | PRN

## 2017-11-29 MED ORDER — MENTHOL 3 MG MT LOZG: 1.0000 | LOZENGE | OROMUCOSAL | Status: DC | PRN

## 2017-11-29 MED ORDER — CEFAZOLIN SODIUM-DEXTROSE 2-4 GM/100ML-% IV SOLN
2.0000 g | INTRAVENOUS | Status: AC
  Administered 2017-11-29: 2 g via INTRAVENOUS

## 2017-11-29 MED ORDER — DIPHENHYDRAMINE HCL 25 MG PO CAPS: 25.0000 mg | ORAL_CAPSULE | ORAL | Status: DC | PRN

## 2017-11-29 MED ORDER — DIPHENHYDRAMINE HCL 25 MG PO CAPS: 25.0000 mg | ORAL_CAPSULE | Freq: Four times a day (QID) | ORAL | Status: DC | PRN

## 2017-11-29 MED ORDER — BUPIVACAINE HCL (PF) 0.25 % IJ SOLN
INTRAMUSCULAR | Status: DC | PRN
  Administered 2017-11-29: 20 mL

## 2017-11-29 MED ORDER — MEPERIDINE HCL 25 MG/ML IJ SOLN
INTRAMUSCULAR | Status: AC
  Filled 2017-11-29: qty 1

## 2017-11-29 MED ORDER — OXYCODONE-ACETAMINOPHEN 5-325 MG PO TABS
1.0000 | ORAL_TABLET | ORAL | Status: DC | PRN
  Administered 2017-11-30 – 2017-12-02 (×7): 1 via ORAL
  Filled 2017-11-29 (×7): qty 1

## 2017-11-29 MED ORDER — TETANUS-DIPHTH-ACELL PERTUSSIS 5-2.5-18.5 LF-MCG/0.5 IM SUSP: 0.5000 mL | Freq: Once | INTRAMUSCULAR | Status: DC

## 2017-11-29 MED ORDER — METHYLERGONOVINE MALEATE 0.2 MG PO TABS: 0.2000 mg | ORAL_TABLET | ORAL | Status: DC | PRN

## 2017-11-29 MED ORDER — NALBUPHINE HCL 10 MG/ML IJ SOLN: 5.0000 mg | INTRAMUSCULAR | Status: DC | PRN

## 2017-11-29 MED ORDER — SODIUM CHLORIDE 0.9% FLUSH: 3.0000 mL | INTRAVENOUS | Status: DC | PRN

## 2017-11-29 MED ORDER — KETOROLAC TROMETHAMINE 30 MG/ML IJ SOLN
INTRAMUSCULAR | Status: AC
  Filled 2017-11-29: qty 1

## 2017-11-29 MED ORDER — STERILE WATER FOR IRRIGATION IR SOLN
Status: DC | PRN
  Administered 2017-11-29: 1000 mL

## 2017-11-29 MED ORDER — KETOROLAC TROMETHAMINE 30 MG/ML IJ SOLN
30.0000 mg | Freq: Once | INTRAMUSCULAR | Status: AC | PRN
  Administered 2017-11-29: 30 mg via INTRAVENOUS

## 2017-11-29 MED ORDER — SIMETHICONE 80 MG PO CHEW: 80.0000 mg | CHEWABLE_TABLET | ORAL | Status: DC | PRN

## 2017-11-29 MED ORDER — PROMETHAZINE HCL 25 MG/ML IJ SOLN: 6.2500 mg | INTRAMUSCULAR | Status: DC | PRN

## 2017-11-29 MED ORDER — MEPERIDINE HCL 25 MG/ML IJ SOLN
INTRAMUSCULAR | Status: DC | PRN
  Administered 2017-11-29 (×2): 12.5 mg via INTRAVENOUS

## 2017-11-29 MED ORDER — LACTATED RINGERS IV SOLN
INTRAVENOUS | Status: DC | PRN
  Administered 2017-11-29: 09:00:00 via INTRAVENOUS

## 2017-11-29 MED ORDER — OXYTOCIN 40 UNITS IN LACTATED RINGERS INFUSION - SIMPLE MED: 2.5000 [IU]/h | INTRAVENOUS | Status: AC

## 2017-11-29 MED ORDER — PHENYLEPHRINE 8 MG IN D5W 100 ML (0.08MG/ML) PREMIX OPTIME
INJECTION | INTRAVENOUS | Status: AC
  Filled 2017-11-29: qty 100

## 2017-11-29 MED ORDER — NALOXONE HCL 0.4 MG/ML IJ SOLN: 0.4000 mg | INTRAMUSCULAR | Status: DC | PRN

## 2017-11-29 MED ORDER — FENTANYL CITRATE (PF) 100 MCG/2ML IJ SOLN
INTRAMUSCULAR | Status: DC | PRN
  Administered 2017-11-29: 15 ug via INTRAVENOUS

## 2017-11-29 MED ORDER — PHENYLEPHRINE 8 MG IN D5W 100 ML (0.08MG/ML) PREMIX OPTIME
INJECTION | INTRAVENOUS | Status: DC | PRN
  Administered 2017-11-29: 60 ug/min via INTRAVENOUS

## 2017-11-29 MED ORDER — NALOXONE HCL 4 MG/10ML IJ SOLN: 1.0000 ug/kg/h | INTRAVENOUS | Status: DC | PRN

## 2017-11-29 MED ORDER — DEXAMETHASONE SODIUM PHOSPHATE 10 MG/ML IJ SOLN
INTRAMUSCULAR | Status: AC
  Filled 2017-11-29: qty 1

## 2017-11-29 MED ORDER — PRENATAL MULTIVITAMIN CH
1.0000 | ORAL_TABLET | Freq: Every day | ORAL | Status: DC
  Administered 2017-11-30 – 2017-12-01 (×2): 1 via ORAL
  Filled 2017-11-29 (×3): qty 1

## 2017-11-29 MED ORDER — DEXAMETHASONE SODIUM PHOSPHATE 10 MG/ML IJ SOLN
INTRAMUSCULAR | Status: DC | PRN
  Administered 2017-11-29: 4 mg via INTRAVENOUS

## 2017-11-29 MED ORDER — METHYLERGONOVINE MALEATE 0.2 MG/ML IJ SOLN: 0.2000 mg | INTRAMUSCULAR | Status: DC | PRN

## 2017-11-29 MED ORDER — LACTATED RINGERS IV SOLN
INTRAVENOUS | Status: DC | PRN
  Administered 2017-11-29: 40 [IU] via INTRAVENOUS

## 2017-11-29 MED ORDER — MORPHINE SULFATE (PF) 0.5 MG/ML IJ SOLN
INTRAMUSCULAR | Status: AC
  Filled 2017-11-29: qty 10

## 2017-11-29 MED ORDER — DIPHENHYDRAMINE HCL 50 MG/ML IJ SOLN: 12.5000 mg | INTRAMUSCULAR | Status: DC | PRN

## 2017-11-29 MED ORDER — ACETAMINOPHEN 325 MG PO TABS
650.0000 mg | ORAL_TABLET | ORAL | Status: DC | PRN
  Administered 2017-11-29: 650 mg via ORAL
  Filled 2017-11-29: qty 2

## 2017-11-29 MED ORDER — ONDANSETRON HCL 4 MG/2ML IJ SOLN
INTRAMUSCULAR | Status: DC | PRN
  Administered 2017-11-29: 4 mg via INTRAVENOUS

## 2017-11-29 MED ORDER — SENNOSIDES-DOCUSATE SODIUM 8.6-50 MG PO TABS
2.0000 | ORAL_TABLET | ORAL | Status: DC
  Administered 2017-11-30 – 2017-12-02 (×3): 2 via ORAL
  Filled 2017-11-29 (×3): qty 2

## 2017-11-29 MED ORDER — ZOLPIDEM TARTRATE 5 MG PO TABS: 5.0000 mg | ORAL_TABLET | Freq: Every evening | ORAL | Status: DC | PRN

## 2017-11-29 MED ORDER — BUPIVACAINE HCL (PF) 0.25 % IJ SOLN
INTRAMUSCULAR | Status: AC
  Filled 2017-11-29: qty 20

## 2017-11-29 MED ORDER — DIBUCAINE 1 % RE OINT: 1.0000 "application " | TOPICAL_OINTMENT | RECTAL | Status: DC | PRN

## 2017-11-29 MED ORDER — OXYCODONE HCL 5 MG PO TABS: 5.0000 mg | ORAL_TABLET | Freq: Once | ORAL | Status: DC | PRN

## 2017-11-29 MED ORDER — IBUPROFEN 600 MG PO TABS
600.0000 mg | ORAL_TABLET | Freq: Four times a day (QID) | ORAL | Status: DC
  Administered 2017-11-29 – 2017-12-02 (×11): 600 mg via ORAL
  Filled 2017-11-29 (×12): qty 1

## 2017-11-29 MED ORDER — LACTATED RINGERS IV SOLN
INTRAVENOUS | Status: DC
  Administered 2017-11-29 (×2): via INTRAVENOUS

## 2017-11-29 SURGICAL SUPPLY — 29 items
ADH SKN CLS APL DERMABOND .7 (GAUZE/BANDAGES/DRESSINGS) ×1
CHLORAPREP W/TINT 26ML (MISCELLANEOUS) ×2 IMPLANT
CLAMP CORD UMBIL (MISCELLANEOUS) ×1 IMPLANT
CLOTH BEACON ORANGE TIMEOUT ST (SAFETY) ×2 IMPLANT
DERMABOND ADVANCED (GAUZE/BANDAGES/DRESSINGS) ×1
DERMABOND ADVANCED .7 DNX12 (GAUZE/BANDAGES/DRESSINGS) IMPLANT
DRSG OPSITE POSTOP 4X10 (GAUZE/BANDAGES/DRESSINGS) ×2 IMPLANT
ELECT REM PT RETURN 9FT ADLT (ELECTROSURGICAL) ×2
ELECTRODE REM PT RTRN 9FT ADLT (ELECTROSURGICAL) ×1 IMPLANT
EXTRACTOR VACUUM M CUP 4 TUBE (SUCTIONS) ×1 IMPLANT
GLOVE BIO SURGEON STRL SZ7.5 (GLOVE) ×2 IMPLANT
GLOVE BIOGEL PI IND STRL 7.0 (GLOVE) ×1 IMPLANT
GLOVE BIOGEL PI INDICATOR 7.0 (GLOVE) ×1
GOWN STRL REUS W/TWL LRG LVL3 (GOWN DISPOSABLE) ×4 IMPLANT
NEEDLE HYPO 22GX1.5 SAFETY (NEEDLE) ×2 IMPLANT
NS IRRIG 1000ML POUR BTL (IV SOLUTION) ×2 IMPLANT
PACK C SECTION WH (CUSTOM PROCEDURE TRAY) ×2 IMPLANT
PENCIL SMOKE EVAC W/HOLSTER (ELECTROSURGICAL) ×2 IMPLANT
SUT MNCRL 0 VIOLET CTX 36 (SUTURE) ×2 IMPLANT
SUT MNCRL AB 3-0 PS2 27 (SUTURE) IMPLANT
SUT MON AB 2-0 CT1 27 (SUTURE) ×2 IMPLANT
SUT MON AB-0 CT1 36 (SUTURE) ×4 IMPLANT
SUT MONOCRYL 0 CTX 36 (SUTURE) ×2
SUT PLAIN 2 0 (SUTURE) ×2
SUT PLAIN ABS 2-0 CT1 27XMFL (SUTURE) IMPLANT
SUT VIC AB 4-0 KS 27 (SUTURE) ×1 IMPLANT
SYR CONTROL 10ML LL (SYRINGE) ×2 IMPLANT
TOWEL OR 17X24 6PK STRL BLUE (TOWEL DISPOSABLE) ×2 IMPLANT
TRAY FOLEY W/BAG SLVR 14FR LF (SET/KITS/TRAYS/PACK) ×2 IMPLANT

## 2017-11-29 NOTE — Anesthesia Procedure Notes (Signed)
Spinal  Patient location during procedure: OB Start time: 11/29/2017 8:08 AM End time: 11/29/2017 8:13 AM Staffing Anesthesiologist: Lynda Rainwater, MD Performed: anesthesiologist  Preanesthetic Checklist Completed: patient identified, surgical consent, pre-op evaluation, timeout performed, IV checked, risks and benefits discussed and monitors and equipment checked Spinal Block Patient position: sitting Prep: site prepped and draped and DuraPrep Patient monitoring: heart rate, cardiac monitor, continuous pulse ox and blood pressure Approach: midline Location: L3-4 Injection technique: single-shot Needle Needle type: Pencan  Needle gauge: 24 G Needle length: 10 cm Assessment Sensory level: T4

## 2017-11-29 NOTE — Anesthesia Postprocedure Evaluation (Signed)
Anesthesia Post Note  Patient: Shelly [name]  Procedure(s) Performed: Repeat CESAREAN SECTION (N/A Abdomen)     Patient location during evaluation: Mother Baby Anesthesia Type: Spinal Level of consciousness: awake and alert Pain management: pain level controlled Vital Signs Assessment: post-procedure vital signs reviewed and stable Respiratory status: spontaneous breathing, nonlabored ventilation and respiratory function stable Cardiovascular status: stable Postop Assessment: no headache, no backache, spinal receding, patient able to bend at knees, no apparent nausea or vomiting, able to ambulate and adequate PO intake Anesthetic complications: no    Last Vitals:  Vitals:   11/29/17 1230 11/29/17 1330  BP: 120/71 114/81  Pulse: 68 61  Resp: 18 16  Temp: 36.8 C   SpO2:      Last Pain:  Vitals:   11/29/17 1230  TempSrc: Oral  PainSc: 3    Pain Goal:                 [employer]

## 2017-11-29 NOTE — Anesthesia Postprocedure Evaluation (Signed)
Anesthesia Post Note  Patient: Shelly [name]  Procedure(s) Performed: Repeat CESAREAN SECTION (N/A Abdomen)     Patient location during evaluation: PACU Anesthesia Type: Spinal Level of consciousness: oriented and awake and alert Pain management: pain level controlled Vital Signs Assessment: post-procedure vital signs reviewed and stable Respiratory status: spontaneous breathing and respiratory function stable Cardiovascular status: blood pressure returned to baseline and stable Postop Assessment: no headache, no backache and no apparent nausea or vomiting Anesthetic complications: no    Last Vitals:  Vitals:   11/29/17 1030 11/29/17 1045  BP: 114/75 118/73  Pulse: 66 73  Resp: 20 20  Temp:    SpO2: 100% 100%    Last Pain:  Vitals:   11/29/17 1045  TempSrc:   PainSc: 3    Pain Goal:    LLE Motor Response: Purposeful movement (11/29/17 1045) LLE Sensation: Tingling (11/29/17 1045) RLE Motor Response: Purposeful movement (11/29/17 1045) RLE Sensation: Tingling (11/29/17 1045)      Lynda Rainwater

## 2017-11-29 NOTE — Op Note (Signed)
Cesarean Section Procedure Note  Indications: previous uterine incision kerr x3 or greater  Pre-operative Diagnosis: 39 week 1 day pregnancy.  Post-operative Diagnosis: same  Surgeon: Lovenia Kim   Assistants: Eddie Dibbles, CNM  Anesthesia: Local anesthesia 0.25.% bupivacaine and Spinal anesthesia  ASA Class: 2  Procedure Details  The patient was seen in the Holding Room. The risks, benefits, complications, treatment options, and expected outcomes were discussed with the patient.  The patient concurred with the proposed plan, giving informed consent. The risks of anesthesia, infection, bleeding and possible injury to other organs discussed. Injury to bowel, bladder, or ureter with possible need for repair discussed. Possible need for transfusion with secondary risks of hepatitis or HIV acquisition discussed. Post operative complications to include but not limited to DVT, PE and Pneumonia noted. The site of surgery properly noted/marked. The patient was taken to Operating Room # 9, identified as Shelly [name] and the procedure verified as C-Section Delivery. A Time Out was held and the above information confirmed.  After induction of anesthesia, the patient was draped and prepped in the usual sterile manner. A Pfannenstiel incision was made and carried down through the subcutaneous tissue to the fascia. Fascial incision was made and extended transversely using Mayo scissors. The fascia was separated from the underlying rectus tissue superiorly and inferiorly. The peritoneum was identified and entered. Peritoneal incision was extended longitudinally. The utero-vesical peritoneal reflection was incised transversely and the bladder flap was bluntly and sharply freed from the adhesed lower uterine segment. A low transverse uterine incision(Kerr hysterotomy) was made. Delivered from OA presentation with vacuum assistance was a  female with Apgar scores of 8 at one minute and 9 at five minutes. Bulb  suctioning gently performed. Neonatal team in attendance.After the umbilical cord was clamped and cut cord blood was obtained for evaluation. The placenta was removed intact and appeared normal. The uterus was curetted with a dry lap pack. Good hemostasis was noted.The uterine outline, tubes and ovaries were palpably normal. The uterine incision was closed with running locked sutures of 0 Monocryl x 2 layers. Hemostasis was observed. Lavage was carried out until clear.The parietal peritoneum was closed with a running 2-0 Monocryl suture. The fascia was then reapproximated with running sutures of 0 Monocryl. The skin was reapproximated with 4-0 vicryl after Gibson closure with 2-0 plain.  Instrument, sponge, and needle counts were correct prior the abdominal closure and at the conclusion of the case.   Findings: FTLF, OA, anterior placenta, thick dense adhesions anterior wall of uterus lysed sharply. Palpably nl adnexa  Estimated Blood Loss:  600         Drains: foley                 Specimens: placenta                 Complications:  None; patient tolerated the procedure well.         Disposition: PACU - hemodynamically stable.         Condition: stable  Attending Attestation: I performed the procedure.

## 2017-11-29 NOTE — Transfer of Care (Signed)
Immediate Anesthesia Transfer of Care Note  Patient: Shelly [name]  Procedure(s) Performed: Repeat CESAREAN SECTION (N/A Abdomen)  Patient Location: PACU  Anesthesia Type:Spinal  Level of Consciousness: awake, alert  and oriented  Airway & Oxygen Therapy: Patient Spontanous Breathing  Post-op Assessment: Report given to RN and Post -op Vital signs reviewed and stable  Post vital signs: Reviewed and stable  Last Vitals:  Vitals Value Taken Time  BP 127/76 11/29/2017  9:19 AM  Temp    Pulse 78 11/29/2017  9:20 AM  Resp 14 11/29/2017  9:20 AM  SpO2 98 % 11/29/2017  9:20 AM  Vitals shown include unvalidated device data.  Last Pain:  Vitals:   11/29/17 0045  TempSrc: Oral  PainSc: 0-No pain         Complications: No apparent anesthesia complications

## 2017-11-29 NOTE — Lactation Note (Signed)
This note was copied from a baby's chart. Lactation Consultation Note  Patient Name: Shelly [name] [id number] Date: 11/29/2017 Reason for consult: Initial assessment;Term  P4 mother whose infant is now 11 hours old  Mother breastfed her first child for 2 months, her second child for 8 months and her last child was bottle fed.  Her goal with this baby is to breastfeed as long as possible.  Baby in bassinet sleeping when I arrived.  Encouraged mother to feed 8-12 times/24 hours or sooner if she shows feeding cues.  Reviewed feeding cues.  Demonstrated hand expression and mother was able to express colostrum drops.  Colostrum container provided for any EBM she may obtain with hand expression.  Mother will finger feed EBM back to baby.  Mom made aware of O/P services, breastfeeding support groups, community resources, and our phone # for post-discharge questions. Mother will call for latch assistance as needed.  Father and visitor present.   Maternal Data Formula Feeding for Exclusion: No Has patient been taught Hand Expression?: Yes Does the patient have breastfeeding experience prior to this delivery?: Yes  Feeding Feeding Type: Breast Fed Length of feed: 7 min  LATCH Score Latch: Repeated attempts needed to sustain latch, nipple held in mouth throughout feeding, stimulation needed to elicit sucking reflex.  Audible Swallowing: Spontaneous and intermittent  Type of Nipple: Everted at rest and after stimulation  Comfort (Breast/Nipple): Soft / non-tender  Hold (Positioning): No assistance needed to correctly position infant at breast.  LATCH Score: 9  Interventions Interventions: Breast feeding basics reviewed;Assisted with latch;Skin to skin;Hand express;Adjust position;Support pillows;Position options  Lactation Tools Discussed/Used WIC Program: No   Consult Status Consult Status: Follow-up Date: 11/30/17 Follow-up type: In-patient    Little Ishikawa 11/29/2017,  12:48 PM

## 2017-11-29 NOTE — Addendum Note (Signed)
Addendum  created 11/29/17 1348 by Hewitt Blade, CRNA   Sign clinical note

## 2017-11-29 NOTE — Anesthesia Preprocedure Evaluation (Signed)
Anesthesia Evaluation  Patient identified by MRN, date of birth, ID band Patient awake    Reviewed: Allergy & Precautions, H&P , Patient's Chart, lab work & pertinent test results, reviewed documented beta blocker date and time   History of Anesthesia Complications (+) PONV and history of anesthetic complications  Airway Mallampati: II  TM Distance: >3 FB Neck ROM: full    Dental no notable dental hx.    Pulmonary neg pulmonary ROS,    Pulmonary exam normal breath sounds clear to auscultation       Cardiovascular Exercise Tolerance: Good negative cardio ROS   Rhythm:regular Rate:Normal     Neuro/Psych Anxiety negative neurological ROS  negative psych ROS   GI/Hepatic negative GI ROS, Neg liver ROS,   Endo/Other  negative endocrine ROS  Renal/GU negative Renal ROS     Musculoskeletal   Abdominal   Peds  Hematology negative hematology ROS (+)   Anesthesia Other Findings   Reproductive/Obstetrics negative OB ROS (+) Pregnancy                             Anesthesia Physical  Anesthesia Plan  ASA: II  Anesthesia Plan: Spinal   Post-op Pain Management:    Induction: Intravenous  PONV Risk Score and Plan: 3 and Treatment may vary due to age or medical condition  Airway Management Planned: Natural Airway  Additional Equipment:   Intra-op Plan:   Post-operative Plan:   Informed Consent: I have reviewed the patients History and Physical, chart, labs and discussed the procedure including the risks, benefits and alternatives for the proposed anesthesia with the patient or authorized representative who has indicated his/her understanding and acceptance.   Dental Advisory Given  Plan Discussed with: CRNA and Surgeon  Anesthesia Plan Comments:         Anesthesia Quick Evaluation

## 2017-11-29 NOTE — Lactation Note (Signed)
This note was copied from a baby's chart. Lactation Consultation Note  Patient Name: Shelly [name] EXBMW'U Date: 11/29/2017 Reason for consult: Follow-up assessment;Difficult latch  LC Follow Up Visit:  Mother requested latch assistance:  Mother was feeding baby in the football hold on the left breast as I entered.  She stated that baby would latch but not stay sucking at the breast.  Assisted mother slightly with better positioning and made sure lips were flanged and baby was latched deeply.  After slight alteration baby began sucking more rhythmically.  She would still try to self release but showed mother how to keep encouraging her to stay at the breast and to compress breast tissue for a deeper latch.  Mother had no pain with latching.  She will call for further assistance as needed.   Maternal Data Formula Feeding for Exclusion: No Has patient been taught Hand Expression?: Yes Does the patient have breastfeeding experience prior to this delivery?: Yes  Feeding Feeding Type: Breast Fed Length of feed: 30 min  LATCH Score Latch: Grasps breast easily, tongue down, lips flanged, rhythmical sucking.  Audible Swallowing: A few with stimulation  Type of Nipple: Everted at rest and after stimulation  Comfort (Breast/Nipple): Soft / non-tender  Hold (Positioning): Assistance needed to correctly position infant at breast and maintain latch.  LATCH Score: 8  Interventions Interventions: Breast feeding basics reviewed;Assisted with latch;Skin to skin;Breast massage;Position options;Hand express;Support pillows;Adjust position;Breast compression  Lactation Tools Discussed/Used WIC Program: No   Consult Status Consult Status: Follow-up Date: 11/30/17 Follow-up type: In-patient    Liisa Picone R Aloma Boch 11/29/2017, 3:50 PM

## 2017-11-29 NOTE — Progress Notes (Signed)
Patient seen and examined. Consent witnessed and signed. No changes noted. Update completed.Patient ID: Shelly [name], female   DOB: 24-Jan-1986, 32 y.o.   MRN: [id number]

## 2017-11-30 ENCOUNTER — Encounter (HOSPITAL_COMMUNITY): Payer: Self-pay | Admitting: *Deleted

## 2017-11-30 DIAGNOSIS — D500 Iron deficiency anemia secondary to blood loss (chronic): Secondary | ICD-10-CM

## 2017-11-30 LAB — CBC
HCT: 28.3 % — ABNORMAL LOW (ref 36.0–46.0)
Hemoglobin: 9.4 g/dL — ABNORMAL LOW (ref 12.0–15.0)
MCH: 29.5 pg (ref 26.0–34.0)
MCHC: 33.2 g/dL (ref 30.0–36.0)
MCV: 88.7 fL (ref 78.0–100.0)
PLATELETS: 170 10*3/uL (ref 150–400)
RBC: 3.19 MIL/uL — ABNORMAL LOW (ref 3.87–5.11)
RDW: 18.7 % — AB (ref 11.5–15.5)
WBC: 11.2 10*3/uL — ABNORMAL HIGH (ref 4.0–10.5)

## 2017-11-30 LAB — BIRTH TISSUE RECOVERY COLLECTION (PLACENTA DONATION)

## 2017-11-30 MED ORDER — MAGNESIUM OXIDE 400 (241.3 MG) MG PO TABS
400.0000 mg | ORAL_TABLET | Freq: Every day | ORAL | Status: DC
  Administered 2017-11-30 – 2017-12-02 (×3): 400 mg via ORAL
  Filled 2017-11-30 (×3): qty 1

## 2017-11-30 MED ORDER — POLYSACCHARIDE IRON COMPLEX 150 MG PO CAPS
150.0000 mg | ORAL_CAPSULE | Freq: Every day | ORAL | Status: DC
  Administered 2017-11-30 – 2017-12-02 (×3): 150 mg via ORAL
  Filled 2017-11-30 (×3): qty 1

## 2017-11-30 NOTE — Progress Notes (Signed)
MOB was referred for history of depression/anxiety. * Referral screened out by Clinical Social Worker because none of the following criteria appear to apply: ~ History of anxiety/depression during this pregnancy, or of post-partum depression following prior delivery. ~ Diagnosis of anxiety and/or depression within last 3 years OR * MOB's symptoms currently being treated with medication and/or therapy. Please contact the Clinical Social Worker if needs arise, by [facility] request, or if MOB scores greater than 9/yes to question 10 on Edinburgh Postpartum Depression Screen.

## 2017-11-30 NOTE — Progress Notes (Signed)
Patient ID: Delorise Hunkele, female   DOB: 1985/06/24, 32 y.o.   MRN: [id number]  Post Partum Day # 1, s/p Repeat Cesarean Section  Subjective:  Patient sitting in bed, nursing infant. No questions or concerns.   Denies difficulty breathing or respiratory distress, chest pain, abdominal pain, excessive vaginal bleeding, dysuria, and leg pain or swelling.   Objective:  Temp:  [97.8 F (36.6 C)-99.3 F (37.4 C)] 97.8 F (36.6 C) (08/20 0559) Pulse Rate:  [58-79] 69 (08/20 0559) Resp:  [14-20] 18 (08/20 0559) BP: (93-120)/(55-81) 93/55 (08/20 0559) SpO2:  [95 %-100 %] 97 % (08/20 0559)  Physical Exam:   General: alert and cooperative   Lungs: clear to auscultation bilaterally  Breasts: normal appearance, no masses or tenderness  Heart: normal apical impulse  Abdomen: soft, non-tender; bowel sounds normal; no masses,  no organomegaly; incision covered by dressing-clean, dry, intact  Pelvis: Lochia: appropriate, Uterine Fundus: firm  Extremities: DVT Evaluation: no evidence of DVT seen on physical exam.  CBC Latest Ref Rng & Units 11/30/2017 11/26/2017  WBC 4.0 - 10.5 K/uL 11.2(H) 8.2  Hemoglobin 12.0 - 15.0 g/dL 9.4(L) 12.5  Hematocrit 36.0 - 46.0 % 28.3(L) 37.7  Platelets 150 - 400 K/uL 170 201    Assessment:  32 y.o. G4P4, postpartum day #1, status post repeat cesarean section, Rh positive, blood loss anemia  Breastfeeding  Plan:  Iron and Magnesium supplementation, see orders.   Routine postpartum care and orders.   Continue orders as written. Reassess as needed.    LOS: 1 day   Diona Fanti, CNM [facility] 11/30/2017 9:54 AM

## 2017-11-30 NOTE — Lactation Note (Signed)
This note was copied from a baby's chart. Lactation Consultation Note  Patient Name: Shelly [name] ACZYS'A Date: 11/30/2017 Reason for consult: Follow-up assessment;Term  P4 mother whose infant is now 46 hours old.    Mother has been breastfeeding well and feels like baby is staying latched onto the breast better today than yesterday.  She stated that baby is content between feedings and her breasts are softer after feedings.    Encouraged to continue feeding 8-12 times/24 hours or sooner if baby shows feeding cues.  Mother has no pain with feedings and no further questions/concerns.  Mother will call as needed for assistance.  Family present.   Maternal Data Formula Feeding for Exclusion: No Has patient been taught Hand Expression?: Yes  Feeding Feeding Type: Breast Fed  LATCH Score                   Interventions    Lactation Tools Discussed/Used     Consult Status Consult Status: Follow-up Date: 12/01/17 Follow-up type: In-patient    [place] 11/30/2017, 2:44 PM

## 2017-12-01 NOTE — Lactation Note (Signed)
This note was copied from a baby's chart. Lactation Consultation Note  Patient Name: Shelly [name] DGLOV'F Date: 12/01/2017 Reason for consult: Follow-up assessment;Term;Infant weight loss;Nipple pain/trauma  Baby is 66 hours old  As LC entered the room , baby fussy, Lc offered to check diaper, noted it was wet  And changed it. Per mom nipples are sore , LC offered to assess and with moms permission.  Reviewed hand expressing, several drops noted,nipples pinky red, no breakdown, per mom and  The RN some bleeding from the right nipple with earlier feeding, none now. Areola edema noted and  Semi compressible areolas.  LC assisted mom with latching on the right breast / cross cradle/ and showed mom reverse pressure and to hand express to enhance the flow.  Baby STS, and  Opened her mouth wide, LC instructed mom on the use breast compressions  With latch until comfort achieved and showed dad how her could assist.  Baby fed for 15 mins , multiple swallows, increased with breast compressions, and released  On her own , nipple well rounded. Mom mentioned that  Was different compared to other feedings  When the nipple was slanted.  Baby satisfied for about 10 mins , and rooting again, LC assisted to latch on the right / football/  Multiple swallows noted, mom more comfortable earlier than the left breast, and baby still feeding.  Since breast are full today, LC reviewed sore nipple and engorgement prevention and tx .  LC instructed mom on the use comfort gels after feedings, EBM to the nipples liberally,  And after the comfort gels warm up, rinse with warm water and switch to the shells between feedings except when sleeping. LC assisted use the shells. Also a hand pump with increased flange to #27 due to soreness.  LC mentioned to mom if she wakes up from sleeping and her breast are just full , but firm, warm with nodules to call the RN for usable ice packs and ice for 15 -20 mins ,hand express,  or pump off  The fullness , reverse pressure and latch. Call for assistance as needed.  LC showed dad how he could assist in the football position. Dad very supportive. Mom and dad  Expressed appreciation for assistance.    Maternal Data Has patient been taught Hand Expression?: Yes  Feeding Feeding Type: Breast Fed Length of feed: 15 min  LATCH Score Latch: Grasps breast easily, tongue down, lips flanged, rhythmical sucking.  Audible Swallowing: Spontaneous and intermittent  Type of Nipple: Everted at rest and after stimulation  Comfort (Breast/Nipple): Filling, red/small blisters or bruises, mild/mod discomfort  Hold (Positioning): Assistance needed to correctly position infant at breast and maintain latch.  LATCH Score: 8  Interventions Interventions: Breast feeding basics reviewed;Assisted with latch;Skin to skin;Breast massage;Hand express;Breast compression;Adjust position;Support pillows;Position options;Expressed milk;Shells;Coconut oil;Comfort gels;Hand pump  Lactation Tools Discussed/Used Tools: Shells;Pump;Flanges;Coconut oil;Comfort gels Flange Size: 24;27 Shell Type: Inverted   Consult Status Consult Status: Follow-up Date: 12/02/17 Follow-up type: In-patient    [place] 12/01/2017, 12:44 PM

## 2017-12-01 NOTE — Progress Notes (Signed)
POSTOPERATIVE DAY # 2 S/P CS-R  S:         Reports feeling ok - really sore with burning at incision all night             Tolerating po intake / no nausea / no vomiting / + flatus / no BM             Bleeding is light             Pain controlled with motrin and oxycodone             Up ad lib / ambulatory/ voiding QS  Newborn Breast   O:  VS: BP 107/68 (BP Location: Left Arm)   Pulse 66   Temp 98 F (36.7 C) (Oral)   Resp 16   Ht 5\' 1"  (1.549 m)   Wt 66.6 kg   LMP 02/28/2017   SpO2 99%   Breastfeeding? Unknown   BMI 27.74 kg/m   LABS:              Recent Labs    11/30/17 0635  WBC 11.2*  HGB 9.4*  PLT 170               Bloodtype: --/--/O POS (08/16 1054)  Rubella: Immune (01/31 0000)                                Physical Exam:             Alert and Oriented X3  Lungs: Clear and unlabored  Heart: regular rate and rhythm / no mumurs  Abdomen: soft, non-tender, non-distended, active BS             Fundus: firm, non-tender, U-1             Dressing intact              Incision:  approximated with suture / no erythema / no ecchymosis / no drainage  Perineum: intact  Lochia: light  Extremities: trace edema, no calf pain or tenderness  A:        POD # 2 S/P CS            Mild ABL anemia - post-op  P:        Routine postoperative care              Adjust analgesia - Kpad - ambulate in halls             [place], MSN, [facility] 12/01/2017, 9:29 AM

## 2017-12-02 ENCOUNTER — Encounter (HOSPITAL_COMMUNITY): Payer: Self-pay | Admitting: *Deleted

## 2017-12-02 ENCOUNTER — Other Ambulatory Visit: Payer: Self-pay

## 2017-12-02 MED ORDER — OXYCODONE-ACETAMINOPHEN 5-325 MG PO TABS: 1.0000 | ORAL_TABLET | ORAL | 0 refills | Status: DC | PRN

## 2017-12-02 MED ORDER — MAGNESIUM OXIDE 400 (241.3 MG) MG PO TABS: 400.0000 mg | ORAL_TABLET | Freq: Every day | ORAL | 0 refills | Status: DC

## 2017-12-02 MED ORDER — IBUPROFEN 600 MG PO TABS: 600.0000 mg | ORAL_TABLET | Freq: Four times a day (QID) | ORAL | 0 refills | Status: DC

## 2017-12-02 MED ORDER — POLYSACCHARIDE IRON COMPLEX 150 MG PO CAPS: 150.0000 mg | ORAL_CAPSULE | Freq: Every day | ORAL | 0 refills | Status: DC

## 2017-12-02 NOTE — Progress Notes (Signed)
POSTOPERATIVE DAY # 3 S/P CS   S:         Reports feeling better - ready to go home             Tolerating po intake / no nausea / no vomiting / + flatus / no BM             Bleeding is light             Pain controlled with Motrin and oxycodone             Up ad lib / ambulatory/ voiding QS  Newborn Breast   O:  VS: BP 131/87 (BP Location: Right Arm)   Pulse 69   Temp 97.7 F (36.5 C) (Oral)   Resp 16   Ht 5\' 1"  (1.549 m)   Wt 66.6 kg   LMP 02/28/2017   SpO2 99%   Breastfeeding? Unknown   BMI 27.74 kg/m    LABS:               Recent Labs    11/30/17 0635  WBC 11.2*  HGB 9.4*  PLT 170               Bloodtype: --/--/O POS (08/16 1054)  Rubella: Immune (01/31 0000)                                               Physical Exam:             Alert and Oriented X3  Lungs: Clear and unlabored  Heart: regular rate and rhythm / no mumurs  Abdomen: soft, non-tender, non-distended, active BS             Fundus: firm, non-tender, U-1             Dressing intact              Incision:  approximated with suture / no erythema / no ecchymosis / no drainage  Perineum: intact  Lochia: light to scant  Extremities: trace edema, no calf pain or tenderness, negative Homans  A:        POD # 3 S/P CS-repeat            IDA of pregnancy compounded by ABL post-op  P:        Routine postoperative care              DC home   Artelia Laroche CNM, MSN, [facility] 12/02/2017, 8:27 AM

## 2017-12-02 NOTE — Discharge Summary (Signed)
OB Discharge Summary  Patient Name: Shelly [name] DOB: [birth year] MRN: [id number]  Date of admission: 11/29/2017  Admitting diagnosis: Previous Cesarean Section x 3 Intrauterine pregnancy: [redacted]w[redacted]d     Secondary diagnosis: Anemia   Date of discharge: 12/02/2017    Discharge diagnosis: Term Pregnancy Delivered and Anemia      Prenatal history: [id number]   EDC : 12/05/2017, by Last Menstrual Period  Prenatal care at [facility]  Primary provider : [place] Prenatal course complicated by previous CS  Prenatal Labs: ABO, Rh: --/--/O POS (08/16 1054)  Antibody: NEG (08/16 1054) Rubella: Immune (01/31 0000)   RPR: Non Reactive (08/16 1054)  HBsAg: Negative (01/31 0000)  HIV: Non-reactive (01/31 0000)  GBS: Positive (07/16 0000)                                    Hospital course:  Sceduled C/S   32 y.o. yo [id number] at [redacted]w[redacted]d was admitted to the hospital 11/29/2017 for scheduled cesarean section with the following indication:Elective Repeat.  Membrane Rupture Time/Date: 8:39 AM ,11/29/2017   Patient delivered a Viable infant.11/29/2017  Details of operation can be found in separate operative note.  Pateint had an uncomplicated postpartum course.  [name], tolerating a regular diet, passing flatus, and urinating well. Patient is discharged home in stable condition on  12/02/17        Delivering PROVIDER: Brien Few                                                            Complications: None  Newborn Data: Live born female  Birth Weight: 8 lb 2.2 oz (3690 g) APGAR: 8, 9  Newborn Delivery   Birth date/time:  11/29/2017 08:39:00 Delivery type:  C-Section, Vacuum Assisted Trial of labor:  No C-section categorization:  Repeat     Baby Feeding: Breast Disposition:home with mother  Post partum procedures:none  Labs: Lab Results  Component Value Date   WBC 11.2 (H) 11/30/2017   HGB 9.4 (L) 11/30/2017   HCT 28.3 (L) 11/30/2017   MCV 88.7  11/30/2017   PLT 170 11/30/2017   CMP Latest Ref Rng & Units 09/25/2016  Glucose 65 - 99 mg/dL 84  BUN 6 - 20 mg/dL 18  Creatinine 0.44 - 1.00 mg/dL 0.81  Sodium 135 - 145 mmol/L 139  Potassium 3.5 - 5.1 mmol/L 3.8  Chloride 101 - 111 mmol/L 103  CO2 22 - 32 mmol/L 27  Calcium 8.9 - 10.3 mg/dL 9.2  Total Protein 6.5 - 8.1 g/dL -  Total Bilirubin 0.3 - 1.2 mg/dL -  Alkaline Phos 38 - 126 U/L -  AST 15 - 41 U/L -  ALT 14 - 54 U/L -      Physical Exam @ time of discharge:  Vitals:   11/30/17 2326 12/01/17 0639 12/01/17 1442 12/02/17 0557  BP: 108/62 107/68 111/79 131/87  Pulse: 67 66 88 69  Resp: 18 16 16 16   Temp: 98.1 F (36.7 C) 98 F (36.7 C) 98.2 F (36.8 C) 97.7 F (36.5 C)  TempSrc: Oral Oral  Oral  SpO2: 99%  99%   Weight:      Height:  General: alert, cooperative and no distress Lochia: appropriate Uterine Fundus: firm Perineum: intact Incision: Healing well with no significant drainage Extremities: DVT Evaluation: No evidence of DVT seen on physical exam.   Discharge instructions:  "Baby and Me Booklet" and [place] Booklet  Discharge Medications:  Allergies as of 12/02/2017   No Known Allergies     Medication List    STOP taking these medications   calcium carbonate 500 MG chewable tablet Commonly known as:  TUMS - dosed in mg elemental calcium     TAKE these medications   acetaminophen 500 MG tablet Commonly known as:  TYLENOL Take 1,000 mg by mouth daily as needed for mild pain or headache.   albuterol 108 (90 Base) MCG/ACT inhaler Commonly known as:  PROVENTIL HFA;VENTOLIN HFA Inhale 2 puffs into the lungs every 4 (four) hours as needed for wheezing or shortness of breath (cough, shortness of breath or wheezing.).   ibuprofen 600 MG tablet Commonly known as:  ADVIL,MOTRIN Take 1 tablet (600 mg total) by mouth every 6 (six) hours.   iron polysaccharides 150 MG capsule Commonly known as:  NIFEREX Take 1 capsule (150 mg total) by  mouth daily.   magnesium oxide 400 (241.3 Mg) MG tablet Commonly known as:  MAG-OX Take 1 tablet (400 mg total) by mouth daily.   multivitamin-prenatal 27-0.8 MG Tabs tablet Take 1 tablet by mouth every evening.   oxyCODONE-acetaminophen 5-325 MG tablet Commonly known as:  PERCOCET/ROXICET Take 1 tablet by mouth every 4 (four) hours as needed (pain scale 4-7).            Discharge Care Instructions  (From admission, onward)         Start     Ordered   12/02/17 0000  Discharge wound care:    Comments:  Leave honeycomb in place for 5 days - remove if get wet in shower. Leave steri-strips in place x 2 weeks. Keep incision clean and dry   12/02/17 0902          Diet: routine diet  Activity: Advance as tolerated. Pelvic rest x 6 weeks.   Follow up:6 weeks    Signed: Artelia Laroche CNM, MSN, [facility] 12/02/2017, 9:02 AM

## 2017-12-16 DIAGNOSIS — R109 Unspecified abdominal pain: Secondary | ICD-10-CM | POA: Diagnosis not present

## 2017-12-16 DIAGNOSIS — Z130 Encounter for screening for diseases of the blood and blood-forming organs and certain disorders involving the immune mechanism: Secondary | ICD-10-CM | POA: Diagnosis not present

## 2017-12-29 DIAGNOSIS — T8189XA Other complications of procedures, not elsewhere classified, initial encounter: Secondary | ICD-10-CM | POA: Diagnosis not present

## 2018-01-13 DIAGNOSIS — N898 Other specified noninflammatory disorders of vagina: Secondary | ICD-10-CM | POA: Diagnosis not present

## 2018-01-13 DIAGNOSIS — Z1151 Encounter for screening for human papillomavirus (HPV): Secondary | ICD-10-CM | POA: Diagnosis not present

## 2018-05-19 ENCOUNTER — Telehealth: Payer: 59 | Admitting: Family

## 2018-05-19 DIAGNOSIS — B9689 Other specified bacterial agents as the cause of diseases classified elsewhere: Secondary | ICD-10-CM

## 2018-05-19 DIAGNOSIS — J329 Chronic sinusitis, unspecified: Secondary | ICD-10-CM

## 2018-05-19 MED ORDER — AMOXICILLIN-POT CLAVULANATE 875-125 MG PO TABS: 1.0000 | ORAL_TABLET | Freq: Two times a day (BID) | ORAL | 0 refills | Status: AC

## 2018-05-19 NOTE — Progress Notes (Signed)
Greater than 5 minutes, yet less than 10 minutes of time have been spent researching, coordinating, and implementing care for this patient today.  Thank you for the details you included in the comment boxes. Those details are very helpful in determining the best course of treatment for you and help us to provide the best care.  We are sorry that you are not feeling well.  Here is how we plan to help!  Based on what you have shared with me it looks like you have sinusitis.  Sinusitis is inflammation and infection in the sinus cavities of the head.  Based on your presentation I believe you most likely have Acute Bacterial Sinusitis.  This is an infection caused by bacteria and is treated with antibiotics. I have prescribed Augmentin 875mg/125mg one tablet twice daily with food, for 7 days. You may use an oral decongestant such as Mucinex D or if you have glaucoma or high blood pressure use plain Mucinex. Saline nasal spray help and can safely be used as often as needed for congestion.  If you develop worsening sinus pain, fever or notice severe headache and vision changes, or if symptoms are not better after completion of antibiotic, please schedule an appointment with a health care provider.    Sinus infections are not as easily transmitted as other respiratory infection, however we still recommend that you avoid close contact with loved ones, especially the very young and elderly.  Remember to wash your hands thoroughly throughout the day as this is the number one way to prevent the spread of infection!  Home Care:  Only take medications as instructed by your medical team.  Complete the entire course of an antibiotic.  Do not take these medications with alcohol.  A steam or ultrasonic humidifier can help congestion.  You can place a towel over your head and breathe in the steam from hot water coming from a faucet.  Avoid close contacts especially the very young and the elderly.  Cover your  mouth when you cough or sneeze.  Always remember to wash your hands.  Get Help Right Away If:  You develop worsening fever or sinus pain.  You develop a severe head ache or visual changes.  Your symptoms persist after you have completed your treatment plan.  Make sure you  Understand these instructions.  Will watch your condition.  Will get help right away if you are not doing well or get worse.  Your e-visit answers were reviewed by a board certified advanced clinical practitioner to complete your personal care plan.  Depending on the condition, your plan could have included both over the counter or prescription medications.  If there is a problem please reply  once you have received a response from your provider.  Your safety is important to us.  If you have drug allergies check your prescription carefully.    You can use MyChart to ask questions about today's visit, request a non-urgent call back, or ask for a work or school excuse for 24 hours related to this e-Visit. If it has been greater than 24 hours you will need to follow up with your provider, or enter a new e-Visit to address those concerns.  You will get an e-mail in the next two days asking about your experience.  I hope that your e-visit has been valuable and will speed your recovery. Thank you for using e-visits.    

## 2018-06-17 DIAGNOSIS — Z118 Encounter for screening for other infectious and parasitic diseases: Secondary | ICD-10-CM | POA: Diagnosis not present

## 2018-06-17 DIAGNOSIS — N760 Acute vaginitis: Secondary | ICD-10-CM | POA: Diagnosis not present

## 2018-07-15 DIAGNOSIS — F411 Generalized anxiety disorder: Secondary | ICD-10-CM | POA: Diagnosis not present

## 2018-10-19 DIAGNOSIS — R109 Unspecified abdominal pain: Secondary | ICD-10-CM | POA: Diagnosis not present

## 2018-10-19 DIAGNOSIS — R439 Unspecified disturbances of smell and taste: Secondary | ICD-10-CM | POA: Diagnosis not present

## 2018-10-19 DIAGNOSIS — R110 Nausea: Secondary | ICD-10-CM | POA: Diagnosis not present

## 2018-10-19 DIAGNOSIS — R5383 Other fatigue: Secondary | ICD-10-CM | POA: Diagnosis not present

## 2018-10-19 DIAGNOSIS — R438 Other disturbances of smell and taste: Secondary | ICD-10-CM | POA: Diagnosis not present

## 2018-10-19 DIAGNOSIS — R51 Headache: Secondary | ICD-10-CM | POA: Diagnosis not present

## 2018-10-19 DIAGNOSIS — M791 Myalgia, unspecified site: Secondary | ICD-10-CM | POA: Diagnosis not present

## 2018-10-19 DIAGNOSIS — Z20828 Contact with and (suspected) exposure to other viral communicable diseases: Secondary | ICD-10-CM | POA: Diagnosis not present

## 2018-10-19 DIAGNOSIS — R5381 Other malaise: Secondary | ICD-10-CM | POA: Diagnosis not present

## 2018-10-19 DIAGNOSIS — R197 Diarrhea, unspecified: Secondary | ICD-10-CM | POA: Diagnosis not present

## 2019-01-15 ENCOUNTER — Ambulatory Visit
Admission: EM | Admit: 2019-01-15 | Discharge: 2019-01-15 | Disposition: A | Discharge status: Home / Self Care | Payer: 59 | Attending: Physician Assistant | Admitting: Physician Assistant

## 2019-01-15 ENCOUNTER — Encounter: Payer: Self-pay | Admitting: Emergency Medicine

## 2019-01-15 ENCOUNTER — Other Ambulatory Visit: Payer: Self-pay

## 2019-01-15 DIAGNOSIS — R0602 Shortness of breath: Secondary | ICD-10-CM | POA: Diagnosis not present

## 2019-01-15 DIAGNOSIS — J069 Acute upper respiratory infection, unspecified: Secondary | ICD-10-CM | POA: Diagnosis not present

## 2019-01-15 MED ORDER — PREDNISONE 50 MG PO TABS: 50.0000 mg | ORAL_TABLET | Freq: Every day | ORAL | 0 refills | Status: DC

## 2019-01-15 MED ORDER — IPRATROPIUM BROMIDE 0.06 % NA SOLN: 2.0000 | Freq: Four times a day (QID) | NASAL | 0 refills | Status: DC

## 2019-01-15 MED ORDER — LEVALBUTEROL TARTRATE 45 MCG/ACT IN AERO: 1.0000 | INHALATION_SPRAY | RESPIRATORY_TRACT | 0 refills | Status: DC | PRN

## 2019-01-15 NOTE — Discharge Instructions (Signed)
EKG normal rhythm. Start prednisone as directed. You can use levalbuterol to see if it will cause less side effects. Atrovent nasal spray for congestion/drainage. Keep hydrated, urine should be clear to pale yellow in color. If experiencing worsening shortness of breath, go to the ED for further evaluation. Otherwise, follow health at work guideline to return to work.

## 2019-01-15 NOTE — ED Notes (Signed)
Patient able to ambulate independently  

## 2019-01-15 NOTE — ED Triage Notes (Addendum)
Pt presents to [facility] for assessment after not feeling great last Sunday night.  Reported to work Monday and began to have a low-grade fever (99.5), sore throat, fatigue, and general malaise.  Patient states she was sent home from work and had a COVID test Tuesday, which was negative.  Patient states the malaise, sore throat, very mild cough persisted since, and doesn't understand why she still feels bad.  C/o diarrhea three times yesterday, c/o of SOB starting Thursday, worse with ambulation.  SpO2 98% at triage.

## 2019-01-15 NOTE — ED Provider Notes (Signed)
[facility]    CSN: [id number] Arrival date & time: 01/15/19  1113      History   Chief Complaint Chief Complaint  Patient presents with  . Fever  . Fatigue    HPI Shelly [name] is a 33 y.o. female.   33 year old female comes in for 6 to 7-day history of URI symptoms.  She has had sore throat, congestion, headache, fatigue, cough.  T-max 100.  4 days ago, started having shortness of breath, where she feels out of breath while talking or moving.  She has not tried albuterol as it can cause significant palpitations.  Yesterday started noticing mild diarrhea.  Denies abdominal pain, nausea, vomiting.  Denies loss of taste or smell.  She was COVID tested 6 days ago with negative results.  She denies birth control use, long travels, one-sided leg swelling, history of blood clots.     Past Medical History:  Diagnosis Date  . Anemia   . Anxiety   . Chronic kidney disease   . Endometriosis of pelvis 04/21/2012   Diagnosed on pathology after operative laparoscopy on 04/21/2012   . PONV (postoperative nausea and vomiting)     Patient Active Problem List   Diagnosis Date Noted  . Blood loss anemia 11/30/2017  . Postpartum care following cesarean delivery 11/29/2017  . R C/S 8/19 11/29/2017  . Previous cesarean delivery affecting pregnancy 11/29/2017  . Change in bowel habits 02/15/2013  . Secondary female infertility 04/21/2012  . Endometriosis of pelvis 04/21/2012    Past Surgical History:  Procedure Laterality Date  . CESAREAN SECTION  09/17/00, 08/31/02, 06/14/08   x 3  . CESAREAN SECTION N/A 11/29/2017   Procedure: Repeat CESAREAN SECTION;  Surgeon: Brien Few, MD;  Location: [facility];  Service: Obstetrics;  Laterality: N/A;  EDD: 12/05/17  . CHROMOPERTUBATION  04/21/2012   Procedure: CHROMOPERTUBATION;  Surgeon: Osborne Oman, MD;  Location: [facility];  Service: Gynecology;  Laterality: N/A;  . LAPAROSCOPIC LYSIS OF ADHESIONS  04/21/2012   Procedure:  LAPAROSCOPIC LYSIS OF ADHESIONS;  Surgeon: Osborne Oman, MD;  Location: [facility];  Service: Gynecology;  Laterality: N/A;  . LAPAROSCOPY  04/21/2012   Procedure: LAPAROSCOPY OPERATIVE;  Surgeon: Osborne Oman, MD;  Location: [facility];  Service: Gynecology;  Laterality: N/A;  Peritoneal biopsies    OB History    Gravida  4   Para  4   Term  4   Preterm  0   AB  0   Living  4     SAB  0   TAB  0   Ectopic  0   Multiple  0   Live Births  1            Home Medications    Prior to Admission medications   Medication Sig Start Date End Date Taking? Authorizing Provider  albuterol (PROVENTIL HFA;VENTOLIN HFA) 108 (90 BASE) MCG/ACT inhaler Inhale 2 puffs into the lungs every 4 (four) hours as needed for wheezing or shortness of breath (cough, shortness of breath or wheezing.). 10/01/13  Yes Daub, Loura Back, MD  acetaminophen (TYLENOL) 500 MG tablet Take 1,000 mg by mouth daily as needed for mild pain or headache.    [provider]  ibuprofen (ADVIL,MOTRIN) 600 MG tablet Take 1 tablet (600 mg total) by mouth every 6 (six) hours. 12/02/17   Artelia Laroche, CNM  ipratropium (ATROVENT) 0.06 % nasal spray Place 2 sprays into both nostrils 4 (four)  times daily. 01/15/19   Tasia Catchings, Amy V, PA-C  levalbuterol (XOPENEX HFA) 45 MCG/ACT inhaler Inhale 1-2 puffs into the lungs every 4 (four) hours as needed for wheezing or shortness of breath. 01/15/19   Tasia Catchings, Amy V, PA-C  predniSONE (DELTASONE) 50 MG tablet Take 1 tablet (50 mg total) by mouth daily with breakfast. 01/15/19   Tasia Catchings, Amy V, PA-C  iron polysaccharides (NIFEREX) 150 MG capsule Take 1 capsule (150 mg total) by mouth daily. 12/02/17 01/15/19  Artelia Laroche, CNM    Family History Family History  Problem Relation Age of Onset  . Breast cancer Mother   . Heart attack Father   . Breast cancer Paternal Aunt   . Colon cancer Other        greatgrandfather and great uncle    Social History Social History   Tobacco Use  . Smoking  status: Never Smoker  . Smokeless tobacco: Never Used  Substance Use Topics  . Alcohol use: No  . Drug use: No     Allergies   Patient has no known allergies.   Review of Systems Review of Systems  Reason unable to perform ROS: See HPI as above.     Physical Exam Triage Vital Signs ED Triage Vitals  Enc Vitals Group     BP 01/15/19 1120 127/89     Pulse Rate 01/15/19 1120 (!) 109     Resp 01/15/19 1120 16     Temp 01/15/19 1120 98.6 F (37 C)     Temp Source 01/15/19 1120 Oral     SpO2 01/15/19 1120 98 %     Weight --      Height --      Head Circumference --      Peak Flow --      Pain Score 01/15/19 1122 2     Pain Loc --      Pain Edu? --      Excl. in [place]? --    No data found.  Updated Vital Signs BP 127/89 (BP Location: Left Arm)   Pulse (!) 109   Temp 98.6 F (37 C) (Oral)   Resp 16   SpO2 98%   Physical Exam Constitutional:      General: She is not in acute distress.    Appearance: Normal appearance. She is not ill-appearing, toxic-appearing or diaphoretic.  HENT:     Head: Normocephalic and atraumatic.     Right Ear: Tympanic membrane, ear canal and external ear normal. Tympanic membrane is not erythematous or bulging.     Left Ear: Tympanic membrane, ear canal and external ear normal. Tympanic membrane is not erythematous or bulging.     Nose:     Right Sinus: Maxillary sinus tenderness and frontal sinus tenderness present.     Left Sinus: Maxillary sinus tenderness and frontal sinus tenderness present.     Mouth/Throat:     Mouth: Mucous membranes are moist.     Pharynx: Oropharynx is clear. Uvula midline.  Neck:     Musculoskeletal: Normal range of motion and neck supple.  Cardiovascular:     Rate and Rhythm: Tachycardia present. Rhythm irregular.     Pulses:          Radial pulses are 2+ on the right side and 2+ on the left side.     Heart sounds: Normal heart sounds. No murmur. No friction rub. No gallop.   Pulmonary:     Effort:  Pulmonary effort is normal. No accessory muscle  usage, prolonged expiration, respiratory distress or retractions.     Comments: Able to speak in full sentences without obvious difficulty. Lungs clear to auscultation without adventitious lung sounds. Neurological:     General: No focal deficit present.     Mental Status: She is alert and oriented to person, place, and time.      UC Treatments / Results  Labs (all labs ordered are listed, but only abnormal results are displayed) Labs Reviewed - No data to display  EKG   Radiology No results found.  Procedures Procedures (including critical care time)  Medications Ordered in UC Medications - No data to display  Initial Impression / Assessment and Plan / UC Course  I have reviewed the triage vital signs and the nursing notes.  Pertinent labs & imaging results that were available during my care of the patient were reviewed by me and considered in my medical decision making (see chart for details).    Patient tachycardic at 109, irregular rhythm, ? PVC, however, patient does feel slight palpitations, will order EKG for further evaluation.  EKG NSR 94bpm, no ST changes. LCTAB. Will provide prednisone for symptoms. Will try levalbuterol to see if less side effects vs albuterol. Other symptomatic treatment discussed. Push fluids. Return precautions given. Patient expresses understanding and agrees to plan.  Final Clinical Impressions(s) / UC Diagnoses   Final diagnoses:  Viral URI  Shortness of breath   ED Prescriptions    Medication Sig Dispense Auth. Provider   levalbuterol [facility] HFA) 45 MCG/ACT inhaler Inhale 1-2 puffs into the lungs every 4 (four) hours as needed for wheezing or shortness of breath. 1 Inhaler Yu, Amy V, PA-C   predniSONE (DELTASONE) 50 MG tablet Take 1 tablet (50 mg total) by mouth daily with breakfast. 5 tablet Yu, Amy V, PA-C   ipratropium (ATROVENT) 0.06 % nasal spray Place 2 sprays into both nostrils 4  (four) times daily. 15 mL Ok Edwards, PA-C     PDMP not reviewed this encounter.   Ok Edwards, PA-C 01/15/19 1157

## 2019-02-10 DIAGNOSIS — R635 Abnormal weight gain: Secondary | ICD-10-CM | POA: Diagnosis not present

## 2019-02-10 DIAGNOSIS — F419 Anxiety disorder, unspecified: Secondary | ICD-10-CM | POA: Diagnosis not present

## 2019-02-10 DIAGNOSIS — R5383 Other fatigue: Secondary | ICD-10-CM | POA: Diagnosis not present

## 2019-02-10 DIAGNOSIS — R4589 Other symptoms and signs involving emotional state: Secondary | ICD-10-CM | POA: Diagnosis not present

## 2019-04-28 DIAGNOSIS — R5383 Other fatigue: Secondary | ICD-10-CM | POA: Diagnosis not present

## 2019-04-28 DIAGNOSIS — R110 Nausea: Secondary | ICD-10-CM | POA: Diagnosis not present

## 2019-04-28 DIAGNOSIS — F411 Generalized anxiety disorder: Secondary | ICD-10-CM | POA: Diagnosis not present

## 2019-04-28 DIAGNOSIS — G43109 Migraine with aura, not intractable, without status migrainosus: Secondary | ICD-10-CM | POA: Diagnosis not present

## 2019-05-05 DIAGNOSIS — R5383 Other fatigue: Secondary | ICD-10-CM | POA: Diagnosis not present

## 2019-05-16 DIAGNOSIS — R5383 Other fatigue: Secondary | ICD-10-CM | POA: Diagnosis not present

## 2019-05-16 DIAGNOSIS — E538 Deficiency of other specified B group vitamins: Secondary | ICD-10-CM | POA: Diagnosis not present

## 2019-05-25 DIAGNOSIS — F418 Other specified anxiety disorders: Secondary | ICD-10-CM | POA: Diagnosis not present

## 2019-05-25 DIAGNOSIS — R5383 Other fatigue: Secondary | ICD-10-CM | POA: Diagnosis not present

## 2019-05-25 DIAGNOSIS — R110 Nausea: Secondary | ICD-10-CM | POA: Diagnosis not present

## 2019-05-25 DIAGNOSIS — G43109 Migraine with aura, not intractable, without status migrainosus: Secondary | ICD-10-CM | POA: Diagnosis not present

## 2019-05-25 DIAGNOSIS — E538 Deficiency of other specified B group vitamins: Secondary | ICD-10-CM | POA: Diagnosis not present

## 2019-05-25 DIAGNOSIS — E559 Vitamin D deficiency, unspecified: Secondary | ICD-10-CM | POA: Diagnosis not present

## 2019-05-25 DIAGNOSIS — Z79899 Other long term (current) drug therapy: Secondary | ICD-10-CM | POA: Diagnosis not present

## 2019-05-25 DIAGNOSIS — G43909 Migraine, unspecified, not intractable, without status migrainosus: Secondary | ICD-10-CM | POA: Diagnosis not present

## 2019-05-25 DIAGNOSIS — R41840 Attention and concentration deficit: Secondary | ICD-10-CM | POA: Diagnosis not present

## 2019-05-25 DIAGNOSIS — R4589 Other symptoms and signs involving emotional state: Secondary | ICD-10-CM | POA: Diagnosis not present

## 2019-05-25 DIAGNOSIS — F411 Generalized anxiety disorder: Secondary | ICD-10-CM | POA: Diagnosis not present

## 2019-05-25 DIAGNOSIS — R790 Abnormal level of blood mineral: Secondary | ICD-10-CM | POA: Diagnosis not present

## 2019-05-26 MED FILL — OMEPRAZOLE DR 20 MG CAPSULE: 20 | 90 days supply | Qty: 90 | Fill #0

## 2019-05-26 MED FILL — SERTRALINE HCL 50 MG TABLET: 50 | 90 days supply | Qty: 135 | Fill #0

## 2019-06-30 DIAGNOSIS — F411 Generalized anxiety disorder: Secondary | ICD-10-CM | POA: Diagnosis not present

## 2019-06-30 DIAGNOSIS — H52223 Regular astigmatism, bilateral: Secondary | ICD-10-CM | POA: Diagnosis not present

## 2019-06-30 DIAGNOSIS — R4589 Other symptoms and signs involving emotional state: Secondary | ICD-10-CM | POA: Diagnosis not present

## 2019-06-30 DIAGNOSIS — F3289 Other specified depressive episodes: Secondary | ICD-10-CM | POA: Diagnosis not present

## 2019-06-30 DIAGNOSIS — R41840 Attention and concentration deficit: Secondary | ICD-10-CM | POA: Diagnosis not present

## 2019-08-04 DIAGNOSIS — F411 Generalized anxiety disorder: Secondary | ICD-10-CM | POA: Diagnosis not present

## 2019-08-04 DIAGNOSIS — F900 Attention-deficit hyperactivity disorder, predominantly inattentive type: Secondary | ICD-10-CM | POA: Diagnosis not present

## 2019-08-04 DIAGNOSIS — F330 Major depressive disorder, recurrent, mild: Secondary | ICD-10-CM | POA: Diagnosis not present

## 2019-09-04 ENCOUNTER — Ambulatory Visit
Admission: EM | Admit: 2019-09-04 | Discharge: 2019-09-04 | Disposition: A | Discharge status: Home / Self Care | Payer: 59

## 2019-09-04 ENCOUNTER — Other Ambulatory Visit: Payer: Self-pay

## 2019-09-04 DIAGNOSIS — M542 Cervicalgia: Secondary | ICD-10-CM | POA: Diagnosis not present

## 2019-09-04 MED ORDER — MELOXICAM 7.5 MG PO TABS
7.5000 mg | ORAL_TABLET | Freq: Every day | ORAL | 0 refills | Status: DC
Start: 2019-09-04 — End: 2019-10-02

## 2019-09-04 NOTE — Discharge Instructions (Signed)
No alarming signs on exam. Start Mobic. Do not take ibuprofen (motrin/advil)/ naproxen (aleve) while on mobic. Follow up with ENT if symptoms not improving.  Monitor for any worsening of symptoms, trouble breathing, trouble swallowing, swelling of the throat, leaning forward to breath, drooling, follow up here or at the emergency department for reevaluation.

## 2019-09-04 NOTE — ED Provider Notes (Signed)
[facility]    CSN: [id number] Arrival date & time: 09/04/19  1827      History   Chief Complaint Chief Complaint  Patient presents with  . Sore Throat    HPI Shelly [name] is a 34 y.o. female.   34 year old female comes in for 1 week history of neck pain/sore throat. States pain is to the anterior neck, which is tender to touch. She feels pain when swallowing, but no sore throat at rest. Denies URI symptoms, fevers. No tripoding, drooling, trismus.     Past Medical History:  Diagnosis Date  . Anemia   . Anxiety   . Chronic kidney disease   . Endometriosis of pelvis 04/21/2012   Diagnosed on pathology after operative laparoscopy on 04/21/2012   . PONV (postoperative nausea and vomiting)     Patient Active Problem List   Diagnosis Date Noted  . Blood loss anemia 11/30/2017  . Postpartum care following cesarean delivery 11/29/2017  . R C/S 8/19 11/29/2017  . Previous cesarean delivery affecting pregnancy 11/29/2017  . Change in bowel habits 02/15/2013  . Secondary female infertility 04/21/2012  . Endometriosis of pelvis 04/21/2012    Past Surgical History:  Procedure Laterality Date  . CESAREAN SECTION  09/17/00, 08/31/02, 06/14/08   x 3  . CESAREAN SECTION N/A 11/29/2017   Procedure: Repeat CESAREAN SECTION;  Surgeon: Brien Few, MD;  Location: [facility];  Service: Obstetrics;  Laterality: N/A;  EDD: 12/05/17  . CHROMOPERTUBATION  04/21/2012   Procedure: CHROMOPERTUBATION;  Surgeon: Osborne Oman, MD;  Location: [facility];  Service: Gynecology;  Laterality: N/A;  . LAPAROSCOPIC LYSIS OF ADHESIONS  04/21/2012   Procedure: LAPAROSCOPIC LYSIS OF ADHESIONS;  Surgeon: Osborne Oman, MD;  Location: [facility];  Service: Gynecology;  Laterality: N/A;  . LAPAROSCOPY  04/21/2012   Procedure: LAPAROSCOPY OPERATIVE;  Surgeon: Osborne Oman, MD;  Location: [facility];  Service: Gynecology;  Laterality: N/A;  Peritoneal biopsies    OB History    Gravida  4   Para  4   Term  4   Preterm  0   AB  0   Living  4     SAB  0   TAB  0   Ectopic  0   Multiple  0   Live Births  1            Home Medications    Prior to Admission medications   Medication Sig Start Date End Date Taking? Authorizing Provider  amphetamine-dextroamphetamine (ADDERALL) 20 MG tablet Take 20 mg by mouth daily.   Yes [provider]  omeprazole (PRILOSEC) 20 MG capsule Take 20 mg by mouth daily.   Yes [provider]  sertraline (ZOLOFT) 25 MG tablet Take 25 mg by mouth daily.   Yes [provider]  acetaminophen (TYLENOL) 500 MG tablet Take 1,000 mg by mouth daily as needed for mild pain or headache.    [provider]  albuterol (PROVENTIL HFA;VENTOLIN HFA) 108 (90 BASE) MCG/ACT inhaler Inhale 2 puffs into the lungs every 4 (four) hours as needed for wheezing or shortness of breath (cough, shortness of breath or wheezing.). 10/01/13   Darlyne Russian, MD  ibuprofen (ADVIL,MOTRIN) 600 MG tablet Take 1 tablet (600 mg total) by mouth every 6 (six) hours. 12/02/17   Artelia Laroche, CNM  ipratropium (ATROVENT) 0.06 % nasal spray Place 2 sprays into both nostrils 4 (four) times daily. 01/15/19   Tasia Catchings, Amy  V, PA-C  levalbuterol (XOPENEX HFA) 45 MCG/ACT inhaler Inhale 1-2 puffs into the lungs every 4 (four) hours as needed for wheezing or shortness of breath. 01/15/19   Tasia Catchings, Amy V, PA-C  meloxicam (MOBIC) 7.5 MG tablet Take 1 tablet (7.5 mg total) by mouth daily. 09/04/19   Tasia Catchings, Amy V, PA-C  iron polysaccharides (NIFEREX) 150 MG capsule Take 1 capsule (150 mg total) by mouth daily. 12/02/17 01/15/19  Artelia Laroche, CNM    Family History Family History  Problem Relation Age of Onset  . Breast cancer Mother   . Heart attack Father   . Breast cancer Paternal Aunt   . Colon cancer Other        greatgrandfather and great uncle    Social History Social History   Tobacco Use  . Smoking status: Never Smoker  . Smokeless tobacco: Never  Used  Substance Use Topics  . Alcohol use: No  . Drug use: No     Allergies   Patient has no known allergies.   Review of Systems Review of Systems  Reason unable to perform ROS: See HPI as above.     Physical Exam Triage Vital Signs ED Triage Vitals  Enc Vitals Group     BP 09/04/19 1849 121/80     Pulse Rate 09/04/19 1849 76     Resp 09/04/19 1849 16     Temp 09/04/19 1849 98.2 F (36.8 C)     Temp Source 09/04/19 1849 Oral     SpO2 09/04/19 1849 98 %     Weight --      Height --      Head Circumference --      Peak Flow --      Pain Score 09/04/19 1858 6     Pain Loc --      Pain Edu? --      Excl. in [place]? --    No data found.  Updated Vital Signs BP 121/80 (BP Location: Left Arm)   Pulse 76   Temp 98.2 F (36.8 C) (Oral)   Resp 16   LMP 09/02/2019   SpO2 98%   Breastfeeding No   Physical Exam Constitutional:      General: She is not in acute distress.    Appearance: Normal appearance. She is not ill-appearing, toxic-appearing or diaphoretic.  HENT:     Head: Normocephalic and atraumatic.     Mouth/Throat:     Mouth: Mucous membranes are moist.     Pharynx: Oropharynx is clear. Uvula midline. No uvula swelling.     Comments: Floor of mouth soft to palpation.  Neck:     Trachea: No tracheal deviation.      Comments: No erythema, warmth, swelling. No obvious lymphadenopathy.  Cardiovascular:     Rate and Rhythm: Normal rate and regular rhythm.     Heart sounds: Normal heart sounds. No murmur. No friction rub. No gallop.   Pulmonary:     Effort: Pulmonary effort is normal. No accessory muscle usage, prolonged expiration, respiratory distress or retractions.     Comments: Lungs clear to auscultation without adventitious lung sounds. Musculoskeletal:     Cervical back: Normal range of motion and neck supple.  Neurological:     General: No focal deficit present.     Mental Status: She is alert and oriented to person, place, and time.      UC  Treatments / Results  Labs (all labs ordered are listed, but only abnormal results are  displayed) Labs Reviewed - No data to display  EKG   Radiology No results found.  Procedures Procedures (including critical care time)  Medications Ordered in UC Medications - No data to display  Initial Impression / Assessment and Plan / UC Course  I have reviewed the triage vital signs and the nursing notes.  Pertinent labs & imaging results that were available during my care of the patient were reviewed by me and considered in my medical decision making (see chart for details).    No alarming signs on exam, but no obvious etiology of pain. Will trial course of NSAIDs for inflammation. Otherwise, to follow up with ENT if symptoms not improving.  Final Clinical Impressions(s) / UC Diagnoses   Final diagnoses:  Neck pain    ED Prescriptions    Medication Sig Dispense Auth. Provider   meloxicam (MOBIC) 7.5 MG tablet Take 1 tablet (7.5 mg total) by mouth daily. 10 tablet Ok Edwards, PA-C     PDMP not reviewed this encounter.   Ok Edwards, PA-C 09/04/19 2155

## 2019-09-04 NOTE — ED Triage Notes (Signed)
Pt c/o sore throat when swallowing but pain is more external. States pain and tender to touch neck and when leaning head back x1wk

## 2019-09-06 ENCOUNTER — Inpatient Hospital Stay
Admission: RE | Admit: 2019-09-06 | Discharge: 2019-09-06 | Disposition: A | Discharge status: Home / Self Care | Payer: 59 | Source: Ambulatory Visit

## 2019-09-06 ENCOUNTER — Telehealth: Payer: 59 | Admitting: Nurse Practitioner

## 2019-09-06 DIAGNOSIS — J029 Acute pharyngitis, unspecified: Secondary | ICD-10-CM

## 2019-09-06 NOTE — Progress Notes (Signed)
Based on what you shared with me it looks like you have something going on with your throat,that should be evaluated in a face to face office visit. I am not sure that I am able to help you with this. You can call ENT and see if they can put you on there call list for cancellation.    NOTE: If you entered your credit card information for this eVisit, you will not be charged. You may see a "hold" on your card for the $35 but that hold will drop off and you will not have a charge processed.  If you are having a true medical emergency please call 911.     For an urgent face to face visit, [facility] has four urgent care centers for your convenience:   . [facility]    [phone]                  Get Driving Directions  [id number] [place], [place] [address] . 10 am to 8 pm Monday-Friday . 12 pm to 8 pm Saturday-Sunday   . [facility] at [place]                  Get Driving Directions  [phone] [place], [place], [place] [address] . 8 am to 8 pm Monday-Friday . 9 am to 6 pm Saturday . 11 am to 6 pm Sunday   . [facility] at [place]                  Get Driving Directions   [address], [place] [address] . 8 am to 8 pm Monday-Friday . 8 am to 4 pm Saturday-Sunday    . [facility] at [place]                    Get Driving Directions  [id number]  [address] [place], [place] [address]  . Monday-Friday, 12 PM to 6 PM    Your e-visit answers were reviewed by a board certified advanced clinical practitioner to complete your personal care plan.  Thank you for using e-Visits.

## 2019-09-12 DIAGNOSIS — R0989 Other specified symptoms and signs involving the circulatory and respiratory systems: Secondary | ICD-10-CM | POA: Diagnosis not present

## 2019-09-12 DIAGNOSIS — M542 Cervicalgia: Secondary | ICD-10-CM | POA: Diagnosis not present

## 2019-09-12 DIAGNOSIS — F900 Attention-deficit hyperactivity disorder, predominantly inattentive type: Secondary | ICD-10-CM | POA: Diagnosis not present

## 2019-09-12 DIAGNOSIS — R6889 Other general symptoms and signs: Secondary | ICD-10-CM | POA: Diagnosis not present

## 2019-09-19 ENCOUNTER — Other Ambulatory Visit: Payer: Self-pay

## 2019-09-19 ENCOUNTER — Ambulatory Visit (INDEPENDENT_AMBULATORY_CARE_PROVIDER_SITE_OTHER): Payer: 59 | Admitting: Otolaryngology

## 2019-09-19 ENCOUNTER — Encounter (INDEPENDENT_AMBULATORY_CARE_PROVIDER_SITE_OTHER): Payer: Self-pay | Admitting: Otolaryngology

## 2019-09-19 VITALS — Temp 97.5°F

## 2019-09-19 DIAGNOSIS — K219 Gastro-esophageal reflux disease without esophagitis: Secondary | ICD-10-CM | POA: Diagnosis not present

## 2019-09-19 NOTE — Progress Notes (Signed)
HPI: Shelly [name] is a 34 y.o. female who presents is referred by Park Meo, PA for evaluation of throat complaints.  She apparently has had a previous history of GERD and was started on omeprazole 20 mg every morning about 2 months ago.  She did well initially but more recently has noted some stomach discomfort as well as nausea.  A couple weeks ago she was having some pain in her neck and she noted some swelling in the lower neck in the region of the thyroid gland but this is doing much better presently.  She was treated with NSAIDs. She has had no fever. She does not smoke.Marland Kitchen  Past Medical History:  Diagnosis Date  . Anemia   . Anxiety   . Chronic kidney disease   . Endometriosis of pelvis 04/21/2012   Diagnosed on pathology after operative laparoscopy on 04/21/2012   . PONV (postoperative nausea and vomiting)    Past Surgical History:  Procedure Laterality Date  . CESAREAN SECTION  09/17/00, 08/31/02, 06/14/08   x 3  . CESAREAN SECTION N/A 11/29/2017   Procedure: Repeat CESAREAN SECTION;  Surgeon: Brien Few, MD;  Location: [facility];  Service: Obstetrics;  Laterality: N/A;  EDD: 12/05/17  . CHROMOPERTUBATION  04/21/2012   Procedure: CHROMOPERTUBATION;  Surgeon: Osborne Oman, MD;  Location: [facility];  Service: Gynecology;  Laterality: N/A;  . LAPAROSCOPIC LYSIS OF ADHESIONS  04/21/2012   Procedure: LAPAROSCOPIC LYSIS OF ADHESIONS;  Surgeon: Osborne Oman, MD;  Location: [facility];  Service: Gynecology;  Laterality: N/A;  . LAPAROSCOPY  04/21/2012   Procedure: LAPAROSCOPY OPERATIVE;  Surgeon: Osborne Oman, MD;  Location: [facility];  Service: Gynecology;  Laterality: N/A;  Peritoneal biopsies   Social History   Socioeconomic History  . Marital status: Divorced    Spouse name: Not on file  . Number of children: 3  . Years of education: Not on file  . Highest education level: Not on file  Occupational History  . Occupation: Ship broker  Tobacco Use  . Smoking status: Never  Smoker  . Smokeless tobacco: Never Used  Substance and Sexual Activity  . Alcohol use: No  . Drug use: No  . Sexual activity: Yes    Birth control/protection: None  Other Topics Concern  . Not on file  Social History Narrative  . Not on file   Social Determinants of Health   Financial Resource Strain:   . Difficulty of Paying Living Expenses:   Food Insecurity:   . Worried About Charity fundraiser in the Last Year:   . Arboriculturist in the Last Year:   Transportation Needs:   . Film/video editor (Medical):   Marland Kitchen Lack of Transportation (Non-Medical):   Physical Activity:   . Days of Exercise per Week:   . Minutes of Exercise per Session:   Stress:   . Feeling of Stress :   Social Connections:   . Frequency of Communication with Friends and Family:   . Frequency of Social Gatherings with Friends and Family:   . Attends Religious Services:   . Active Member of Clubs or Organizations:   . Attends Archivist Meetings:   Marland Kitchen Marital Status:    Family History  Problem Relation Age of Onset  . Breast cancer Mother   . Heart attack Father   . Breast cancer Paternal Aunt   . Colon cancer Other        greatgrandfather and great uncle  No Known Allergies Prior to Admission medications   Medication Sig Start Date End Date Taking? Authorizing Provider  acetaminophen (TYLENOL) 500 MG tablet Take 1,000 mg by mouth daily as needed for mild pain or headache.   Yes [provider]  albuterol (PROVENTIL HFA;VENTOLIN HFA) 108 (90 BASE) MCG/ACT inhaler Inhale 2 puffs into the lungs every 4 (four) hours as needed for wheezing or shortness of breath (cough, shortness of breath or wheezing.). 10/01/13  Yes Daub, Loura Back, MD  amphetamine-dextroamphetamine (ADDERALL) 20 MG tablet Take 20 mg by mouth daily.   Yes [provider]  ibuprofen (ADVIL,MOTRIN) 600 MG tablet Take 1 tablet (600 mg total) by mouth every 6 (six) hours. 12/02/17  Yes Artelia Laroche, CNM   ipratropium (ATROVENT) 0.06 % nasal spray Place 2 sprays into both nostrils 4 (four) times daily. 01/15/19  Yes Yu, Amy V, PA-C  levalbuterol (XOPENEX HFA) 45 MCG/ACT inhaler Inhale 1-2 puffs into the lungs every 4 (four) hours as needed for wheezing or shortness of breath. 01/15/19  Yes Yu, Amy V, PA-C  meloxicam (MOBIC) 7.5 MG tablet Take 1 tablet (7.5 mg total) by mouth daily. 09/04/19  Yes Yu, Amy V, PA-C  omeprazole (PRILOSEC) 20 MG capsule Take 20 mg by mouth daily.   Yes [provider]  sertraline (ZOLOFT) 25 MG tablet Take 25 mg by mouth daily.   Yes [provider]  iron polysaccharides (NIFEREX) 150 MG capsule Take 1 capsule (150 mg total) by mouth daily. 12/02/17 01/15/19  Artelia Laroche, CNM     Positive ROS: Otherwise negative  All other systems have been reviewed and were otherwise negative with the exception of those mentioned in the HPI and as above.  Physical Exam: Constitutional: Alert, well-appearing, no acute distress Ears: External ears without lesions or tenderness. Ear canals are clear bilaterally with intact, clear TMs.  Nasal: External nose without lesions. Septum midline with mild rhinitis.  Both millimeters regions are clear with no signs of infection.. Clear nasal passages otherwise. Oral: Lips and gums without lesions. Tongue and palate mucosa without lesions. Posterior oropharynx clear.  Tonsils are 2+ and benign in appearance bilaterally. Fiberoptic laryngoscopy was performed through the left nostril.  On fiberoptic laryngoscopy the nasopharynx was clear.  The base of tongue vallecula epiglottis were normal.  Vocal cords were clear bilaterally with normal vocal mobility.  She had mild edema of the arytenoid mucosa consistent with probable GE reflux disease.  I was able to pass the fiberoptic laryngoscope through the upper esophageal sphincter without any difficulty with clear upper cervical esophagus. Neck: There is no swelling or erythema of the neck.   Palpation of the thyroid gland reveals no thyroid gland enlargement.  No palpable adenopathy in the neck.  No swelling or erythema of the neck noted today. Respiratory: Breathing comfortably  Skin: No facial/neck lesions or rash noted.  Laryngoscopy  Date/Time: 09/19/2019 4:41 PM Performed by: Rozetta Nunnery, MD Authorized by: Rozetta Nunnery, MD   Consent:    Consent obtained:  Verbal   Consent given by:  Patient Procedure details:    Indications: direct visualization of the upper aerodigestive tract     Medication:  Afrin   Instrument: flexible fiberoptic laryngoscope     Scope location: left nare   Sinus:    Left nasopharynx: normal   Throat:    Right hypopharynx: normal     Left hypopharynx: normal     Pyriform sinus: normal     True vocal cords:  normal   Comments:     On fiberoptic laryngoscopy the hypopharynx and larynx was clear although she did have some mild edema of the arytenoid mucosa which may be indicative of laryngeal pharyngeal reflux.  No mucosal abnormalities noted.  I was able to pass the fiberoptic laryngoscope through the upper esophageal sphincter without difficulty and the upper cervical esophageal mucosa was clear.    Assessment: Neck discomfort probably related to gastroesophageal reflux disease.  No clinical signs of infection. She may have previously had some thyroid inflammation but has no palpable thyroid disease presently.  Plan: Recommended taking omeprazole 20 mg before dinner as this will provide better nighttime coverage of GERD. However if she continues to have abdominal discomfort and nausea would recommend further evaluation with gastroenterology.   Radene Journey, MD   CC:

## 2019-10-02 ENCOUNTER — Encounter: Payer: Self-pay | Admitting: Emergency Medicine

## 2019-10-02 ENCOUNTER — Encounter (HOSPITAL_COMMUNITY): Payer: Self-pay

## 2019-10-02 ENCOUNTER — Other Ambulatory Visit: Payer: Self-pay

## 2019-10-02 ENCOUNTER — Ambulatory Visit (INDEPENDENT_AMBULATORY_CARE_PROVIDER_SITE_OTHER)
Admission: EM | Admit: 2019-10-02 | Discharge: 2019-10-02 | Disposition: A | Discharge status: Home / Self Care | Payer: 59 | Source: Home / Self Care

## 2019-10-02 ENCOUNTER — Emergency Department (HOSPITAL_COMMUNITY)
Admission: EM | Admit: 2019-10-02 | Discharge: 2019-10-03 | Disposition: A | Discharge status: Home / Self Care | Payer: 59 | Attending: Emergency Medicine | Admitting: Emergency Medicine

## 2019-10-02 DIAGNOSIS — N189 Chronic kidney disease, unspecified: Secondary | ICD-10-CM | POA: Diagnosis not present

## 2019-10-02 DIAGNOSIS — Z79899 Other long term (current) drug therapy: Secondary | ICD-10-CM | POA: Insufficient documentation

## 2019-10-02 DIAGNOSIS — R109 Unspecified abdominal pain: Secondary | ICD-10-CM

## 2019-10-02 DIAGNOSIS — R1031 Right lower quadrant pain: Secondary | ICD-10-CM | POA: Diagnosis not present

## 2019-10-02 LAB — POCT URINALYSIS DIP (MANUAL ENTRY)
Bilirubin, UA: NEGATIVE
Glucose, UA: NEGATIVE mg/dL
Leukocytes, UA: NEGATIVE
Nitrite, UA: NEGATIVE
Protein Ur, POC: 30 mg/dL — AB
Spec Grav, UA: >=1.03 — AB (ref 1.010–1.025)
Urobilinogen, UA: 0.2 E.U./dL
pH, UA: 5.5 (ref 5.0–8.0)

## 2019-10-02 LAB — CBC
HCT: 37.2 % (ref 36.0–46.0)
Hemoglobin: 11.8 g/dL — ABNORMAL LOW (ref 12.0–15.0)
MCH: 26.8 pg (ref 26.0–34.0)
MCHC: 31.7 g/dL (ref 30.0–36.0)
MCV: 84.4 fL (ref 80.0–100.0)
Platelets: 314 10*3/uL (ref 150–400)
RBC: 4.41 MIL/uL (ref 3.87–5.11)
RDW: 13.8 % (ref 11.5–15.5)
WBC: 12.7 10*3/uL — ABNORMAL HIGH (ref 4.0–10.5)
nRBC: 0 % (ref 0.0–0.2)

## 2019-10-02 LAB — COMPREHENSIVE METABOLIC PANEL
ALT: 11 U/L (ref 0–44)
AST: 14 U/L — ABNORMAL LOW (ref 15–41)
Albumin: 4.1 g/dL (ref 3.5–5.0)
Alkaline Phosphatase: 54 U/L (ref 38–126)
Anion gap: 8 (ref 5–15)
BUN: 15 mg/dL (ref 6–20)
CO2: 23 mmol/L (ref 22–32)
Calcium: 8.5 mg/dL — ABNORMAL LOW (ref 8.9–10.3)
Chloride: 108 mmol/L (ref 98–111)
Creatinine, Ser: 0.84 mg/dL (ref 0.44–1.00)
GFR calc Af Amer: >60 mL/min (ref >60)
GFR calc non Af Amer: >60 mL/min (ref >60)
Glucose, Bld: 139 mg/dL — ABNORMAL HIGH (ref 70–99)
Potassium: 3.4 mmol/L — ABNORMAL LOW (ref 3.5–5.1)
Sodium: 139 mmol/L (ref 135–145)
Total Bilirubin: 0.4 mg/dL (ref 0.3–1.2)
Total Protein: 7.4 g/dL (ref 6.5–8.1)

## 2019-10-02 LAB — POCT URINE PREGNANCY: Preg Test, Ur: NEGATIVE

## 2019-10-02 LAB — I-STAT BETA HCG BLOOD, ED (MC, WL, AP ONLY): I-stat hCG, quantitative: <5 m[IU]/mL (ref <5)

## 2019-10-02 LAB — LIPASE, BLOOD: Lipase: 44 U/L (ref 11–51)

## 2019-10-02 MED ORDER — ONDANSETRON 4 MG PO TBDP
4.0000 mg | ORAL_TABLET | Freq: Once | ORAL | Status: AC
  Administered 2019-10-02: 4 mg via ORAL

## 2019-10-02 MED ORDER — ONDANSETRON HCL 4 MG PO TABS
4.0000 mg | ORAL_TABLET | Freq: Four times a day (QID) | ORAL | 0 refills | Status: DC
Start: 2019-10-02 — End: 2019-10-03

## 2019-10-02 MED ORDER — SODIUM CHLORIDE 0.9% FLUSH: 3.0000 mL | Freq: Once | INTRAVENOUS | Status: DC

## 2019-10-02 MED ORDER — KETOROLAC TROMETHAMINE 60 MG/2ML IM SOLN
60.0000 mg | Freq: Once | INTRAMUSCULAR | Status: AC
  Administered 2019-10-02: 60 mg via INTRAMUSCULAR

## 2019-10-02 MED ORDER — IBUPROFEN 800 MG PO TABS
800.0000 mg | ORAL_TABLET | Freq: Three times a day (TID) | ORAL | 0 refills | Status: DC
Start: 2019-10-02 — End: 2019-10-03

## 2019-10-02 MED ORDER — TRAMADOL HCL 50 MG PO TABS: 50.0000 mg | ORAL_TABLET | Freq: Two times a day (BID) | ORAL | 0 refills | Status: DC | PRN

## 2019-10-02 NOTE — ED Triage Notes (Signed)
Pt arrives to ED w/ c/o RL/UQ abdominal pain radiating toward back. Pt sent from UC for eval of kidney stones vs appendecitis. Pt endorses n/v. Pt reports 6/10, received Toradol at UC.

## 2019-10-02 NOTE — Discharge Instructions (Signed)
Unable to rule out appendicitis in urgent care setting.  Offered patient further evaluation and management in the ED.  Patient declines at this time and would like to try outpatient therapy first.  Aware of the risk associated with this decision including missed diagnosis, organ damage, organ failure, and/or death.  Patient aware and in agreement.     Drink plenty of fluids and get rest Toradol shot given in office Zofran given in office Ibuprofen prescribed.  Take as directed for pain Zofran as needed for nausea Tramadol for severe break-through pain.  DO NOT TAKE prior to driving or operating heavy machinery Follow up with PCP if symptoms persist Return or go to ER if you have any new or worsening symptoms (difficulty urinating, blood in urine, pain that does not moderate with medication, fever, chills, abdominal pain, etc...)

## 2019-10-02 NOTE — ED Provider Notes (Signed)
[facility]   [id number] 10/02/19 Arrival Time: 6433  CC: ABDOMINAL DISCOMFORT  SUBJECTIVE:  Shelly [name] is a 34 y.o. female who presents with complaint of RLQ and RT flank pain x 2 hours.  Denies a precipitating event, or trauma.  Localizes pain to RLQ and RT flank.  Describes as constant and sharp in character.  Denies alleviating or aggravating factors.  Reports similar symptoms in the past with kidney stones.  Complains of associated nausea, and 2 episodes of vomiting.  Some diarrhea last night  Denies fever, chills, chest pain, SOB, constipation, hematochezia, melena, dysuria, difficulty urinating, increased frequency or urgency, flank pain, loss of bowel or bladder function.   Patient's last menstrual period was 09/02/2019.  ROS: As per HPI.  All other pertinent ROS negative.     Past Medical History:  Diagnosis Date  . Anemia   . Anxiety   . Chronic kidney disease   . Endometriosis of pelvis 04/21/2012   Diagnosed on pathology after operative laparoscopy on 04/21/2012   . PONV (postoperative nausea and vomiting)    Past Surgical History:  Procedure Laterality Date  . CESAREAN SECTION  09/17/00, 08/31/02, 06/14/08   x 3  . CESAREAN SECTION N/A 11/29/2017   Procedure: Repeat CESAREAN SECTION;  Surgeon: Brien Few, MD;  Location: [facility];  Service: Obstetrics;  Laterality: N/A;  EDD: 12/05/17  . CHROMOPERTUBATION  04/21/2012   Procedure: CHROMOPERTUBATION;  Surgeon: Osborne Oman, MD;  Location: [facility];  Service: Gynecology;  Laterality: N/A;  . LAPAROSCOPIC LYSIS OF ADHESIONS  04/21/2012   Procedure: LAPAROSCOPIC LYSIS OF ADHESIONS;  Surgeon: Osborne Oman, MD;  Location: [facility];  Service: Gynecology;  Laterality: N/A;  . LAPAROSCOPY  04/21/2012   Procedure: LAPAROSCOPY OPERATIVE;  Surgeon: Osborne Oman, MD;  Location: [facility];  Service: Gynecology;  Laterality: N/A;  Peritoneal biopsies   No Known Allergies No current facility-administered  medications on file prior to encounter.   Current Outpatient Medications on File Prior to Encounter  Medication Sig Dispense Refill  . acetaminophen (TYLENOL) 500 MG tablet Take 1,000 mg by mouth daily as needed for mild pain or headache.    . albuterol (PROVENTIL HFA;VENTOLIN HFA) 108 (90 BASE) MCG/ACT inhaler Inhale 2 puffs into the lungs every 4 (four) hours as needed for wheezing or shortness of breath (cough, shortness of breath or wheezing.). 1 Inhaler 3  . amphetamine-dextroamphetamine (ADDERALL) 20 MG tablet Take 20 mg by mouth daily.    Marland Kitchen ipratropium (ATROVENT) 0.06 % nasal spray Place 2 sprays into both nostrils 4 (four) times daily. 15 mL 0  . levalbuterol (XOPENEX HFA) 45 MCG/ACT inhaler Inhale 1-2 puffs into the lungs every 4 (four) hours as needed for wheezing or shortness of breath. 1 Inhaler 0  . omeprazole (PRILOSEC) 20 MG capsule Take 20 mg by mouth daily.    . sertraline (ZOLOFT) 25 MG tablet Take 25 mg by mouth daily.    . [DISCONTINUED] iron polysaccharides (NIFEREX) 150 MG capsule Take 1 capsule (150 mg total) by mouth daily. 30 capsule 0   Social History   Socioeconomic History  . Marital status: Divorced    Spouse name: Not on file  . Number of children: 3  . Years of education: Not on file  . Highest education level: Not on file  Occupational History  . Occupation: Ship broker  Tobacco Use  . Smoking status: Never Smoker  . Smokeless tobacco: Never Used  Substance and Sexual Activity  . Alcohol  use: No  . Drug use: No  . Sexual activity: Yes    Birth control/protection: None  Other Topics Concern  . Not on file  Social History Narrative  . Not on file   Social Determinants of Health   Financial Resource Strain:   . Difficulty of Paying Living Expenses:   Food Insecurity:   . Worried About Charity fundraiser in the Last Year:   . Arboriculturist in the Last Year:   Transportation Needs:   . Film/video editor (Medical):   Marland Kitchen Lack of Transportation  (Non-Medical):   Physical Activity:   . Days of Exercise per Week:   . Minutes of Exercise per Session:   Stress:   . Feeling of Stress :   Social Connections:   . Frequency of Communication with Friends and Family:   . Frequency of Social Gatherings with Friends and Family:   . Attends Religious Services:   . Active Member of Clubs or Organizations:   . Attends Archivist Meetings:   Marland Kitchen Marital Status:   Intimate Partner Violence:   . Fear of Current or Ex-Partner:   . Emotionally Abused:   Marland Kitchen Physically Abused:   . Sexually Abused:    Family History  Problem Relation Age of Onset  . Breast cancer Mother   . Heart attack Father   . Breast cancer Paternal Aunt   . Colon cancer Other        greatgrandfather and great uncle     OBJECTIVE:  Vitals:   10/02/19 1324 10/02/19 1331  BP: 102/63   Pulse: 95   Resp: 17   Temp: 98.1 F (36.7 C)   TempSrc: Oral   SpO2: 99%   Weight:  136 lb (61.7 kg)  Height:  5\' 1"  (1.549 m)    General appearance: Alert; NAD HEENT: NCAT.  Oropharynx clear.  Lungs: clear to auscultation bilaterally without adventitious breath sounds Heart: regular rate and rhythm.   Abdomen: soft, non-distended; normal active bowel sounds; TTP at McBurney's point; negative Murphy's sign; minimal guarding Back: no CVA tenderness Extremities: no edema; symmetrical with no gross deformities Skin: warm and dry Neurologic: normal gait Psychological: alert and cooperative; normal mood and affect  LABS: Results for orders placed or performed during the hospital encounter of 10/02/19 (from the past 24 hour(s))  POCT urinalysis dipstick     Status: Abnormal   Collection Time: 10/02/19  1:32 PM  Result Value Ref Range   Color, UA yellow yellow   Clarity, UA clear clear   Glucose, UA negative negative mg/dL   Bilirubin, UA negative negative   Ketones, POC UA small (15) (A) negative mg/dL   Spec Grav, UA >=1.030 (A) 1.010 - 1.025   Blood, UA small (A)  negative   pH, UA 5.5 5.0 - 8.0   Protein Ur, POC =30 (A) negative mg/dL   Urobilinogen, UA 0.2 0.2 or 1.0 E.U./dL   Nitrite, UA Negative Negative   Leukocytes, UA Negative Negative  POCT urine pregnancy     Status: None   Collection Time: 10/02/19  1:32 PM  Result Value Ref Range   Preg Test, Ur Negative Negative    ASSESSMENT & PLAN:  1. RLQ abdominal pain   2. Right flank pain     Meds ordered this encounter  Medications  . ibuprofen (ADVIL) 800 MG tablet    Sig: Take 1 tablet (800 mg total) by mouth 3 (three) times daily.  Dispense:  21 tablet    Refill:  0    Order Specific Question:   Supervising Provider    Answer:   Raylene Everts [[id number]]  . traMADol (ULTRAM) 50 MG tablet    Sig: Take 1 tablet (50 mg total) by mouth every 12 (twelve) hours as needed.    Dispense:  10 tablet    Refill:  0    Order Specific Question:   Supervising Provider    Answer:   Raylene Everts [[id number]]  . ketorolac (TORADOL) injection 60 mg  . ondansetron (ZOFRAN-ODT) disintegrating tablet 4 mg  . ondansetron (ZOFRAN) 4 MG tablet    Sig: Take 1 tablet (4 mg total) by mouth every 6 (six) hours.    Dispense:  12 tablet    Refill:  0    Order Specific Question:   Supervising Provider    Answer:   Raylene Everts [[id number]]   Unable to rule out appendicitis in urgent care setting.  Offered patient further evaluation and management in the ED.  Patient declines at this time and would like to try outpatient therapy first.  Aware of the risk associated with this decision including missed diagnosis, organ damage, organ failure, and/or death.  Patient aware and in agreement.     Drink plenty of fluids and get rest Toradol shot given in office Zofran given in office Ibuprofen prescribed.  Take as directed for pain Zofran as needed for nausea Tramadol for severe break-through pain.  DO NOT TAKE prior to driving or operating heavy machinery Follow up with PCP if symptoms persist Return  or go to ER if you have any new or worsening symptoms (difficulty urinating, blood in urine, pain that does not moderate with medication, fever, chills, abdominal pain, etc...)   Reviewed expectations re: course of current medical issues. Questions answered. Outlined signs and symptoms indicating need for more acute intervention. Patient verbalized understanding. After Visit Summary given.   Lestine Box, PA-C 10/02/19 1403

## 2019-10-02 NOTE — ED Triage Notes (Signed)
RLQ pain that radiates to RT flank x 1hour.  Hx of kidney stones

## 2019-10-03 ENCOUNTER — Emergency Department (HOSPITAL_COMMUNITY): Payer: 59

## 2019-10-03 DIAGNOSIS — R109 Unspecified abdominal pain: Secondary | ICD-10-CM | POA: Diagnosis not present

## 2019-10-03 DIAGNOSIS — Z79899 Other long term (current) drug therapy: Secondary | ICD-10-CM | POA: Diagnosis not present

## 2019-10-03 DIAGNOSIS — N189 Chronic kidney disease, unspecified: Secondary | ICD-10-CM | POA: Diagnosis not present

## 2019-10-03 LAB — URINALYSIS, ROUTINE W REFLEX MICROSCOPIC
Bilirubin Urine: NEGATIVE
Glucose, UA: NEGATIVE mg/dL
Ketones, ur: 5 mg/dL — AB
Leukocytes,Ua: NEGATIVE
Nitrite: NEGATIVE
Protein, ur: 100 mg/dL — AB
Specific Gravity, Urine: 1.011 (ref 1.005–1.030)
pH: 6 (ref 5.0–8.0)

## 2019-10-03 MED ORDER — KETOROLAC TROMETHAMINE 15 MG/ML IJ SOLN
30.0000 mg | Freq: Once | INTRAMUSCULAR | Status: AC
  Administered 2019-10-03: 30 mg via INTRAMUSCULAR
  Filled 2019-10-03: qty 2

## 2019-10-03 MED ORDER — ONDANSETRON 4 MG PO TBDP
4.0000 mg | ORAL_TABLET | Freq: Once | ORAL | Status: AC
  Administered 2019-10-03: 4 mg via ORAL
  Filled 2019-10-03: qty 1

## 2019-10-03 MED ORDER — OXYCODONE-ACETAMINOPHEN 5-325 MG PO TABS
1.0000 | ORAL_TABLET | Freq: Once | ORAL | Status: AC
  Administered 2019-10-03: 1 via ORAL
  Filled 2019-10-03: qty 1

## 2019-10-03 MED ORDER — NAPROXEN 500 MG PO TABS: 500.0000 mg | ORAL_TABLET | Freq: Two times a day (BID) | ORAL | 0 refills | Status: DC

## 2019-10-03 NOTE — Discharge Instructions (Signed)
Please read and follow all provided instructions.  Your diagnoses today include:  1. Right sided abdominal pain     Tests performed today include:  Blood counts and electrolytes -slightly elevated infection fighting cells  Blood tests to check liver and kidney function  Blood tests to check pancreas function  Urine test to look for infection and pregnancy (in women) -shows some blood under the dipstick but not on the microscope  CT scan of the abdomen and pelvis -no signs of kidney stones but also no sign of appendicitis or other significant problems in the abdomen  Vital signs. See below for your results today.   Medications prescribed:   Naproxen - anti-inflammatory pain medication  Do not exceed 500mg  naproxen every 12 hours, take with food  You have been prescribed an anti-inflammatory medication or NSAID. Take with food. Take smallest effective dose for the shortest duration needed for your pain. Stop taking if you experience stomach pain or vomiting.   You were prescribed tramadol at urgent care yesterday.  You may fill this and take as needed for more severe pain.  Do not take this if you have a history of seizure disorder.  Take any prescribed medications only as directed.  Home care instructions:   Follow any educational materials contained in this packet.  Follow-up instructions: Please follow-up with your primary care provider in the next 2-3 days for further evaluation of your symptoms.    Return instructions:  SEEK IMMEDIATE MEDICAL ATTENTION IF:  The pain does not go away or becomes severe   A temperature above 101F develops   Repeated vomiting occurs (multiple episodes)   The pain becomes localized to portions of the abdomen. The right side could possibly be appendicitis. In an adult, the left lower portion of the abdomen could be colitis or diverticulitis.   Blood is being passed in stools or vomit (bright red or black tarry stools)   You develop  chest pain, difficulty breathing, dizziness or fainting, or become confused, poorly responsive, or inconsolable (young children)  If you have any other emergent concerns regarding your health  Additional Information: Abdominal (belly) pain can be caused by many things. Your caregiver performed an examination and possibly ordered blood/urine tests and imaging (CT scan, x-rays, ultrasound). Many cases can be observed and treated at home after initial evaluation in the emergency department. Even though you are being discharged home, abdominal pain can be unpredictable. Therefore, you need a repeated exam if your pain does not resolve, returns, or worsens. Most patients with abdominal pain don't have to be admitted to the hospital or have surgery, but serious problems like appendicitis and gallbladder attacks can start out as nonspecific pain. Many abdominal conditions cannot be diagnosed in one visit, so follow-up evaluations are very important.  Your vital signs today were: BP 114/80 (BP Location: Left Arm)   Pulse 72   Temp 98.6 F (37 C) (Oral)   Resp 17   Ht 5\' 1"  (1.549 m)   Wt 61.7 kg   SpO2 100%   BMI 25.70 kg/m  If your blood pressure (bp) was elevated above 135/85 this visit, please have this repeated by your doctor within one month. --------------

## 2019-10-03 NOTE — ED Provider Notes (Signed)
[facility] EMERGENCY DEPARTMENT Provider Note   CSN: [id number] Arrival date & time: 10/02/19  1530     History Chief Complaint  Patient presents with   Abdominal Pain    Shelly [name] is a 34 y.o. female.  Patient with history of kidney stones, cesarean section presents the emergency department with complaint of right sided abdominal pain and right flank pain over the past day.  Pain began abruptly late yesterday morning.  She went to urgent care where she was treated with Toradol which helped.  Pain then returned so she presented to the emergency department.  She states that at the urgent care they could not rule out appendicitis -- and was encouraged to go to the emergency department.  Initial pain was associated with vomiting.  No recent vomiting.  No hematuria or irritative UTI symptoms including dysuria, increased frequency or urgency.  She denies vaginal bleeding or discharge.  Onset of symptoms acute.  Course is constant.        Past Medical History:  Diagnosis Date   Anemia    Anxiety    Chronic kidney disease    Endometriosis of pelvis 04/21/2012   Diagnosed on pathology after operative laparoscopy on 04/21/2012    PONV (postoperative nausea and vomiting)     Patient Active Problem List   Diagnosis Date Noted   Blood loss anemia 11/30/2017   Postpartum care following cesarean delivery 11/29/2017   R C/S 8/19 11/29/2017   Previous cesarean delivery affecting pregnancy 11/29/2017   Change in bowel habits 02/15/2013   Secondary female infertility 04/21/2012   Endometriosis of pelvis 04/21/2012    Past Surgical History:  Procedure Laterality Date   CESAREAN SECTION  09/17/00, 08/31/02, 06/14/08   x 3   CESAREAN SECTION N/A 11/29/2017   Procedure: Repeat CESAREAN SECTION;  Surgeon: Brien Few, MD;  Location: [place];  Service: Obstetrics;  Laterality: N/A;  EDD: 12/05/17   CHROMOPERTUBATION  04/21/2012   Procedure:  CHROMOPERTUBATION;  Surgeon: Osborne Oman, MD;  Location: [facility];  Service: Gynecology;  Laterality: N/A;   LAPAROSCOPIC LYSIS OF ADHESIONS  04/21/2012   Procedure: LAPAROSCOPIC LYSIS OF ADHESIONS;  Surgeon: Osborne Oman, MD;  Location: [facility];  Service: Gynecology;  Laterality: N/A;   LAPAROSCOPY  04/21/2012   Procedure: LAPAROSCOPY OPERATIVE;  Surgeon: Osborne Oman, MD;  Location: [facility];  Service: Gynecology;  Laterality: N/A;  Peritoneal biopsies     OB History    Gravida  4   Para  4   Term  4   Preterm  0   AB  0   Living  4     SAB  0   TAB  0   Ectopic  0   Multiple  0   Live Births  1           Family History  Problem Relation Age of Onset   Breast cancer Mother    Heart attack Father    Breast cancer Paternal Aunt    Colon cancer Other        greatgrandfather and great uncle    Social History   Tobacco Use   Smoking status: Never Smoker   Smokeless tobacco: Never Used  Substance Use Topics   Alcohol use: No   Drug use: No    Home Medications Prior to Admission medications   Medication Sig Start Date End Date Taking? Authorizing Provider  acetaminophen (TYLENOL) 500 MG tablet Take 1,000 mg  by mouth daily as needed for mild pain or headache.    [provider]  albuterol (PROVENTIL HFA;VENTOLIN HFA) 108 (90 BASE) MCG/ACT inhaler Inhale 2 puffs into the lungs every 4 (four) hours as needed for wheezing or shortness of breath (cough, shortness of breath or wheezing.). 10/01/13   Darlyne Russian, MD  amphetamine-dextroamphetamine (ADDERALL) 20 MG tablet Take 20 mg by mouth daily.    [provider]  ibuprofen (ADVIL) 800 MG tablet Take 1 tablet (800 mg total) by mouth 3 (three) times daily. 10/02/19   Wurst, [place], PA-C  ipratropium (ATROVENT) 0.06 % nasal spray Place 2 sprays into both nostrils 4 (four) times daily. 01/15/19   Tasia Catchings, Amy V, PA-C  levalbuterol (XOPENEX HFA) 45 MCG/ACT inhaler Inhale 1-2 puffs into the  lungs every 4 (four) hours as needed for wheezing or shortness of breath. 01/15/19   Tasia Catchings, Amy V, PA-C  omeprazole (PRILOSEC) 20 MG capsule Take 20 mg by mouth daily.    [provider]  ondansetron (ZOFRAN) 4 MG tablet Take 1 tablet (4 mg total) by mouth every 6 (six) hours. 10/02/19   Wurst, [place], PA-C  sertraline (ZOLOFT) 25 MG tablet Take 25 mg by mouth daily.    [provider]  traMADol (ULTRAM) 50 MG tablet Take 1 tablet (50 mg total) by mouth every 12 (twelve) hours as needed. 10/02/19   Wurst, [place], PA-C  iron polysaccharides (NIFEREX) 150 MG capsule Take 1 capsule (150 mg total) by mouth daily. 12/02/17 01/15/19  Artelia Laroche, CNM    Allergies    Patient has no known allergies.  Review of Systems   Review of Systems  Constitutional: Negative for fever.  HENT: Negative for rhinorrhea and sore throat.   Eyes: Negative for redness.  Respiratory: Negative for cough.   Cardiovascular: Negative for chest pain.  Gastrointestinal: Positive for abdominal pain, nausea and vomiting. Negative for diarrhea.  Genitourinary: Positive for flank pain. Negative for dysuria, frequency, vaginal bleeding and vaginal discharge.  Musculoskeletal: Negative for myalgias.  Skin: Negative for rash.  Neurological: Negative for headaches.    Physical Exam Updated Vital Signs BP 114/80 (BP Location: Left Arm)    Pulse 72    Temp 98.6 F (37 C) (Oral)    Resp 17    Ht 5\' 1"  (1.549 m)    Wt 61.7 kg    SpO2 100%    BMI 25.70 kg/m   Physical Exam Vitals and nursing note reviewed.  Constitutional:      Appearance: She is well-developed.  HENT:     Head: Normocephalic and atraumatic.  Eyes:     General:        Right eye: No discharge.        Left eye: No discharge.     Conjunctiva/sclera: Conjunctivae normal.  Cardiovascular:     Rate and Rhythm: Normal rate and regular rhythm.     Heart sounds: Normal heart sounds.  Pulmonary:     Effort: Pulmonary effort is normal.      Breath sounds: Normal breath sounds.  Abdominal:     Palpations: Abdomen is soft.     Tenderness: There is abdominal tenderness (mild) in the right upper quadrant and right lower quadrant. There is no guarding or rebound. Negative signs include Murphy's sign and Rovsing's sign.  Musculoskeletal:     Cervical back: Normal range of motion and neck supple.  Skin:    General: Skin is warm and dry.  Neurological:  Mental Status: She is alert.     ED Results / Procedures / Treatments   Labs (all labs ordered are listed, but only abnormal results are displayed) Labs Reviewed  COMPREHENSIVE METABOLIC PANEL - Abnormal; Notable for the following components:      Result Value   Potassium 3.4 (*)    Glucose, Bld 139 (*)    Calcium 8.5 (*)    AST 14 (*)    All other components within normal limits  CBC - Abnormal; Notable for the following components:   WBC 12.7 (*)    Hemoglobin 11.8 (*)    All other components within normal limits  URINALYSIS, ROUTINE W REFLEX MICROSCOPIC - Abnormal; Notable for the following components:   APPearance HAZY (*)    Hgb urine dipstick MODERATE (*)    Ketones, ur 5 (*)    Protein, ur 100 (*)    Bacteria, UA FEW (*)    All other components within normal limits  LIPASE, BLOOD  I-STAT BETA HCG BLOOD, ED ([facility], [facility], [facility] ONLY)    EKG None  Radiology CT Renal Stone Study  Result Date: 10/03/2019 CLINICAL DATA:  Right flank pain and hematuria. History of nephrolithiasis. EXAM: CT ABDOMEN AND PELVIS WITHOUT CONTRAST TECHNIQUE: Multidetector CT imaging of the abdomen and pelvis was performed following the standard protocol without IV contrast. COMPARISON:  09/26/2016 CT abdomen/pelvis. FINDINGS: Lower chest: No significant pulmonary nodules or acute consolidative airspace disease. Bilateral breast prostheses are partially visualized. Hepatobiliary: Normal liver size. Simple 1.1 cm posterior right liver cyst (series 3/image 17). No additional liver lesions. Normal  gallbladder with no radiopaque cholelithiasis. No biliary ductal dilatation. Pancreas: Normal, with no mass or duct dilation. Spleen: Normal size. No mass. Adrenals/Urinary Tract: Normal adrenals. No renal stones. No hydronephrosis. No contour deforming renal masses. Normal caliber ureters. No ureteral stones. Normal bladder. Stomach/Bowel: Normal non-distended stomach. Normal caliber small bowel with no small bowel wall thickening. Normal appendix. Normal large bowel with no diverticulosis, large bowel wall thickening or pericolonic fat stranding. Vascular/Lymphatic: Normal caliber abdominal aorta. No pathologically enlarged lymph nodes in the abdomen or pelvis. Reproductive: Grossly normal uterus.  No adnexal mass. Other: No pneumoperitoneum, ascites or focal fluid collection. Musculoskeletal: No aggressive appearing focal osseous lesions. IMPRESSION: No acute abnormality. No urolithiasis. No hydronephrosis. Electronically Signed   By: Ilona Sorrel M.D.   On: 10/03/2019 09:28    Procedures Procedures (including critical care time)  Medications Ordered in ED Medications  sodium chloride flush (NS) 0.9 % injection 3 mL (has no administration in time range)  ketorolac (TORADOL) 15 MG/ML injection 30 mg (30 mg Intramuscular Given 10/03/19 0801)  ondansetron (ZOFRAN-ODT) disintegrating tablet 4 mg (4 mg Oral Given 10/03/19 0801)  oxyCODONE-acetaminophen (PERCOCET/ROXICET) 5-325 MG per tablet 1 tablet (1 tablet Oral Given 10/03/19 0801)    ED Course  I have reviewed the triage vital signs and the nursing notes.  Pertinent labs & imaging results that were available during my care of the patient were reviewed by me and considered in my medical decision making (see chart for details).  Patient seen and examined.  Reviewed urgent care documentation.  Reviewed lab and urine results to this point.  Clinically patient has what sounds like a kidney stone.  She is currently comfortable.  Will treat pain which is  currently rated at 7 out of 10.  Given right upper and right lower quadrant tenderness on exam, will obtain CT imaging to confirm or refute ureteral stone.  Vital signs  reviewed and are as follows: BP 114/80 (BP Location: Left Arm)    Pulse 72    Temp 98.6 F (37 C) (Oral)    Resp 17    Ht 5\' 1"  (1.549 m)    Wt 61.7 kg    SpO2 100%    BMI 25.70 kg/m   CT personally reviewed.  Results reviewed.  Patient updated on results.  She is resting comfortably in the hallway.  Plan for discharged home at this time.  Prescription given for naproxen.  She was prescribed tramadol at urgent care yesterday which she has not yet filled.  She may take this if she would like for more severe pain.  The patient was urged to return to the Emergency Department immediately with worsening of current symptoms, worsening abdominal pain, persistent vomiting, blood noted in stools, fever, or any other concerns. The patient verbalized understanding.   Encouraged follow-up with her doctor in the next 2 to 3 days if her symptoms or not improved.     MDM Rules/Calculators/A&P                          Patient with abdominal pain, right-sided, history of kidney stones. Vitals are stable, no fever. Labs with slightly elevated white blood cell count, hemoglobin on dipstick, 0-5 red cells on microscopy. Imaging CT renal protocol without any acute findings. No signs of dehydration, patient is tolerating PO's. Lungs are clear and no signs suggestive of PNA. Low concern for appendicitis, cholecystitis/cholelithiasis, pancreatitis, ruptured viscus, UTI, kidney stone, aortic dissection, aortic aneurysm or other emergent abdominal etiology.  No large ovarian cysts noted.  Patient symptoms are not consistent with torsion, TOA, PID.  Supportive therapy indicated with return if symptoms worsen.     Final Clinical Impression(s) / ED Diagnoses Final diagnoses:  Right sided abdominal pain    Rx / DC Orders ED Discharge Orders          Ordered    naproxen (NAPROSYN) 500 MG tablet  2 times daily     Discontinue  Reprint     10/03/19 0946           Carlisle Cater, PA-C 10/03/19 [place], [place], MD 10/04/19 1340

## 2019-10-05 MED FILL — OMEPRAZOLE DR 20 MG CAPSULE: 20 | 90 days supply | Qty: 90 | Fill #1

## 2019-10-05 MED FILL — SERTRALINE HCL 50 MG TABLET: 50 | 90 days supply | Qty: 135 | Fill #1

## 2019-10-10 ENCOUNTER — Telehealth: Payer: 59 | Admitting: Emergency Medicine

## 2019-10-10 DIAGNOSIS — R300 Dysuria: Secondary | ICD-10-CM | POA: Diagnosis not present

## 2019-10-10 MED ORDER — CEPHALEXIN 500 MG PO CAPS
500.0000 mg | ORAL_CAPSULE | Freq: Two times a day (BID) | ORAL | 0 refills | Status: DC
Start: 2019-10-10 — End: 2020-11-08

## 2019-10-10 NOTE — Progress Notes (Signed)

## 2019-10-12 MED FILL — ADDERALL XR 30 MG CAP SA: 30 | 30 days supply | Qty: 30 | Fill #0

## 2019-11-13 MED FILL — AMPHETAMINE-DEXTROAMPHET ER: 30 | 30 days supply | Qty: 30 | Fill #0

## 2019-12-19 ENCOUNTER — Telehealth: Payer: 59 | Admitting: Physician Assistant

## 2019-12-19 ENCOUNTER — Encounter: Payer: Self-pay | Admitting: Oncology

## 2019-12-19 ENCOUNTER — Telehealth: Payer: Self-pay | Admitting: Oncology

## 2019-12-19 DIAGNOSIS — Z20822 Contact with and (suspected) exposure to covid-19: Secondary | ICD-10-CM

## 2019-12-19 MED ORDER — PREDNISONE 10 MG PO TABS
40.0000 mg | ORAL_TABLET | Freq: Every day | ORAL | 0 refills | Status: AC
Start: 2019-12-19 — End: 2019-12-24

## 2019-12-19 NOTE — Telephone Encounter (Signed)
Called to Discuss with patient about Covid symptoms and the use of regeneron, a monoclonal antibody infusion for those with mild to moderate Covid symptoms and at a high risk of hospitalization.     Pt is qualified for this infusion at the [facility] infusion center due to co-morbid conditions and/or a member of an at-risk group.     Unable to reach pt. VM full and unable to leave a VM. Sent Mychart message.   Rulon Abide AGNP-C [phone] ([place])

## 2019-12-19 NOTE — Progress Notes (Signed)
E-Visit for Corona Virus Screening  We are sorry you are not feeling well. We are here to help!  You reported that you have tested positive for COVID-19, meaning that you were infected with the novel coronavirus and could give the germ to others.    Sorry you are not feeling well. We can prescribed a short course of prednisone for wheezing. This may or may not help.  Monoclonal antibody infusions have proven to reduce hospital stays and lower emergency room visits for people with COVID-19 who qualify. "Qualifying" means someone who tests positive for COVID-19, has risk factors, but is not having shortness of breath or is on oxygen. [place] began using these man-made substances that help the immune system early in the coronavirus pandemic.    You will have to contact your primary care provider for information regarding monoclonal antibody treatment. Asthma is qualifying condition for monoclonal antibody treatment. I see you have a history of childhood asthma but according to our [facility] records there is no official diagnosis of asthma in your chart. Also, it appears we also do not have access to your positive covid test results which would also be required prior to treatment.  Please discuss this with your primary care provider.   You have been enrolled in [place] for COVID-19. Daily you will receive a questionnaire within the [place] website. Our COVID-19 response team will be monitoring your responses daily.  Please continue isolation at home, for at least 10 days since the start of your symptoms and until you have had 24 hours with no fever (without taking a fever reducer) and with improving of symptoms.  Please continue good preventive care measures, including:  frequent hand-washing, avoid touching your face, cover coughs/sneezes, stay out of crowds and keep a 6 foot distance from others.  Follow up with your provider or go to the nearest hospital ED for re-assessment if  fever/cough/breathlessness return.  The following symptoms may appear 2-14 days after exposure: . Fever . Cough . Shortness of breath or difficulty breathing . Chills . Repeated shaking with chills . Muscle pain . Headache . Sore throat . New loss of taste or smell . Fatigue . Congestion or runny nose . Nausea or vomiting . Diarrhea  Go to the nearest hospital ED for assessment if fever/cough/breathlessness are severe or illness seems like a threat to life.  It is vitally important that if you feel that you have an infection such as this virus or any other virus that you stay home and away from places where you may spread it to others.  You should avoid contact with people age 24 and older.     You may also take acetaminophen (Tylenol) as needed for fever.  Reduce your risk of any infection by using the same precautions used for avoiding the common cold or flu:  [place] Kitchen Wash your hands often with soap and warm water for at least 20 seconds.  If soap and water are not readily available, use an alcohol-based hand sanitizer with at least 60% alcohol.  . If coughing or sneezing, cover your mouth and nose by coughing or sneezing into the elbow areas of your shirt or coat, into a tissue or into your sleeve (not your hands). . Avoid shaking hands with others and consider head nods or verbal greetings only. . Avoid touching your eyes, nose, or mouth with unwashed hands.  . Avoid close contact with people who are sick. . Avoid places or events with large  numbers of people in one location, like concerts or sporting events. . Carefully consider travel plans you have or are making. . If you are planning any travel outside or inside the [place], visit the CDC's Travelers' Health webpage for the latest health notices. . If you have some symptoms but not all symptoms, continue to monitor at home and seek medical attention if your symptoms worsen. . If you are having a medical emergency, call 911.  HOME  CARE . Only take medications as instructed by your medical team. . Drink plenty of fluids and get plenty of rest. . A steam or ultrasonic humidifier can help if you have congestion.   GET HELP RIGHT AWAY IF YOU HAVE EMERGENCY WARNING SIGNS** FOR COVID-19. If you or someone is showing any of these signs seek emergency medical care immediately. Call 911 or proceed to your closest emergency facility if: . You develop worsening high fever. . Trouble breathing . Bluish lips or face . Persistent pain or pressure in the chest . New confusion . Inability to wake or stay awake . You cough up blood. . Your symptoms become more severe  **This list is not all possible symptoms. Contact your medical provider for any symptoms that are sever or concerning to you.  MAKE SURE YOU   Understand these instructions.  Will watch your condition.  Will get help right away if you are not doing well or get worse.  Your e-visit answers were reviewed by a board certified advanced clinical practitioner to complete your personal care plan.  Depending on the condition, your plan could have included both over the counter or prescription medications.  If there is a problem please reply once you have received a response from your provider.  Your safety is important to [place].  If you have drug allergies check your prescription carefully.    You can use MyChart to ask questions about today's visit, request a non-urgent call back, or ask for a work or school excuse for 24 hours related to this e-Visit. If it has been greater than 24 hours you will need to follow up with your provider, or enter a new e-Visit to address those concerns. You will get an e-mail in the next two days asking about your experience.  I hope that your e-visit has been valuable and will speed your recovery. Thank you for using e-visits.     15 minutes on chart

## 2019-12-20 ENCOUNTER — Ambulatory Visit (HOSPITAL_COMMUNITY)
Admission: RE | Admit: 2019-12-20 | Discharge: 2019-12-20 | Disposition: A | Discharge status: Home / Self Care | Payer: HRSA Program | Source: Ambulatory Visit | Attending: Pulmonary Disease | Admitting: Pulmonary Disease

## 2019-12-20 ENCOUNTER — Other Ambulatory Visit: Payer: Self-pay | Admitting: Oncology

## 2019-12-20 ENCOUNTER — Encounter: Payer: Self-pay | Admitting: Oncology

## 2019-12-20 DIAGNOSIS — U071 COVID-19: Secondary | ICD-10-CM

## 2019-12-20 MED ORDER — METHYLPREDNISOLONE SODIUM SUCC 125 MG IJ SOLR: 125.0000 mg | Freq: Once | INTRAMUSCULAR | Status: DC | PRN

## 2019-12-20 MED ORDER — DIPHENHYDRAMINE HCL 50 MG/ML IJ SOLN: 50.0000 mg | Freq: Once | INTRAMUSCULAR | Status: DC | PRN

## 2019-12-20 MED ORDER — EPINEPHRINE 0.3 MG/0.3ML IJ SOAJ: 0.3000 mg | Freq: Once | INTRAMUSCULAR | Status: DC | PRN

## 2019-12-20 MED ORDER — ALBUTEROL SULFATE HFA 108 (90 BASE) MCG/ACT IN AERS: 2.0000 | INHALATION_SPRAY | Freq: Once | RESPIRATORY_TRACT | Status: DC | PRN

## 2019-12-20 MED ORDER — SODIUM CHLORIDE 0.9 % IV SOLN: INTRAVENOUS | Status: DC | PRN

## 2019-12-20 MED ORDER — SODIUM CHLORIDE 0.9 % IV SOLN
1200.0000 mg | Freq: Once | INTRAVENOUS | Status: AC
  Administered 2019-12-20: 1200 mg via INTRAVENOUS
  Filled 2019-12-20: qty 10

## 2019-12-20 MED ORDER — FAMOTIDINE IN NACL 20-0.9 MG/50ML-% IV SOLN: 20.0000 mg | Freq: Once | INTRAVENOUS | Status: DC | PRN

## 2019-12-20 NOTE — Progress Notes (Signed)
  Diagnosis: COVID-19  Physician:Dr. Patrick Wright  Procedure: Covid Infusion Clinic Med: casirivimab\imdevimab infusion - Provided patient with casirivimab\imdevimab fact sheet for patients, parents and caregivers prior to infusion.  Complications: No immediate complications noted.  Discharge: Discharged home   Thom Ollinger 12/20/2019   

## 2019-12-20 NOTE — Progress Notes (Signed)
I connected by phone with  Mrs. Dinan  to discuss the potential use of an new treatment for mild to moderate COVID-19 viral infection in non-hospitalized patients.   This patient is a age/sex that meets the FDA criteria for Emergency Use Authorization of casirivimab\imdevimab.  Has a (+) direct SARS-CoV-2 viral test result 1. Has mild or moderate COVID-19  2. Is ? 36 years of age and weighs ? 40 kg 3. Is NOT hospitalized due to COVID-19 4. Is NOT requiring oxygen therapy or requiring an increase in baseline oxygen flow rate due to COVID-19 5. Is within 10 days of symptom onset 6. Has at least one of the high risk factor(s) for progression to severe COVID-19 and/or hospitalization as defined in EUA. ? Specific high risk criteria : Obesity    Symptom onset  11/15/2019   I have spoken and communicated the following to the patient or parent/caregiver:   1. FDA has authorized the emergency use of casirivimab\imdevimab for the treatment of mild to moderate COVID-19 in adults and pediatric patients with positive results of direct SARS-CoV-2 viral testing who are 28 years of age and older weighing at least 40 kg, and who are at high risk for progressing to severe COVID-19 and/or hospitalization.   2. The significant known and potential risks and benefits of casirivimab\imdevimab, and the extent to which such potential risks and benefits are unknown.   3. Information on available alternative treatments and the risks and benefits of those alternatives, including clinical trials.   4. Patients treated with casirivimab\imdevimab should continue to self-isolate and use infection control measures (e.g., wear mask, isolate, social distance, avoid sharing personal items, clean and disinfect "high touch" surfaces, and frequent handwashing) according to CDC guidelines.    5. The patient or parent/caregiver has the option to accept or refuse casirivimab\imdevimab .   After reviewing this information with the  patient, The patient agreed to proceed with receiving casirivimab\imdevimab infusion and will be provided a copy of the Fact sheet prior to receiving the infusion.Rulon Abide, AGNP-C [phone] ([place])

## 2019-12-20 NOTE — Discharge Instructions (Signed)

## 2020-03-06 ENCOUNTER — Telehealth: Payer: Self-pay | Admitting: Nurse Practitioner

## 2020-03-06 DIAGNOSIS — N3000 Acute cystitis without hematuria: Secondary | ICD-10-CM

## 2020-03-06 MED ORDER — NITROFURANTOIN MONOHYD MACRO 100 MG PO CAPS: 100.0000 mg | ORAL_CAPSULE | Freq: Two times a day (BID) | ORAL | 0 refills | Status: DC

## 2020-03-06 NOTE — Progress Notes (Signed)

## 2020-04-04 ENCOUNTER — Telehealth: Payer: Self-pay | Admitting: Emergency Medicine

## 2020-04-04 DIAGNOSIS — J329 Chronic sinusitis, unspecified: Secondary | ICD-10-CM

## 2020-04-04 MED ORDER — FLUTICASONE PROPIONATE 50 MCG/ACT NA SUSP: 2.0000 | Freq: Every day | NASAL | 0 refills | Status: DC

## 2020-04-04 NOTE — Progress Notes (Signed)
We are sorry that you are not feeling well.  Here is how we plan to help!  Based on what you have shared with me it looks like you have sinusitis.  Sinusitis is inflammation and infection in the sinus cavities of the head.  Based on your presentation I believe you most likely have Acute Viral Sinusitis.This is an infection most likely caused by a virus. There is not specific treatment for viral sinusitis other than to help you with the symptoms until the infection runs its course.  You may use an oral decongestant such as Mucinex D or if you have glaucoma or high blood pressure use plain Mucinex. Saline nasal spray help and can safely be used as often as needed for congestion, I have prescribed: Fluticasone nasal spray two sprays in each nostril once a day  Some authorities believe that zinc sprays or the use of Echinacea may shorten the course of your symptoms.  Providers prescribe antibiotics to treat infections caused by bacteria. Antibiotics are very powerful in treating bacterial infections when they are used properly. To maintain their effectiveness, they should be used only when necessary. Overuse of antibiotics has resulted in the development of superbugs that are resistant to treatment!    After careful review of your answers, I would not recommend an antibiotic for your condition.  Antibiotics are not effective against viruses and therefore should not be used to treat them. Common examples of infections caused by viruses include colds and flu    Sinus infections are not as easily transmitted as other respiratory infection, however we still recommend that you avoid close contact with loved ones, especially the very young and elderly.  Remember to wash your hands thoroughly throughout the day as this is the number one way to prevent the spread of infection!  Home Care:  Only take medications as instructed by your medical team.  Do not take these medications with alcohol.  A steam or  ultrasonic humidifier can help congestion.  You can place a towel over your head and breathe in the steam from hot water coming from a faucet.  Avoid close contacts especially the very young and the elderly.  Cover your mouth when you cough or sneeze.  Always remember to wash your hands.  Get Help Right Away If:  You develop worsening fever or sinus pain.  You develop a severe head ache or visual changes.  Your symptoms persist after you have completed your treatment plan.  Make sure you  Understand these instructions.  Will watch your condition.  Will get help right away if you are not doing well or get worse.  Your e-visit answers were reviewed by a board certified advanced clinical practitioner to complete your personal care plan.  Depending on the condition, your plan could have included both over the counter or prescription medications.  If there is a problem please reply  once you have received a response from your provider.  Your safety is important to [place].  If you have drug allergies check your prescription carefully.    You can use MyChart to ask questions about today's visit, request a non-urgent call back, or ask for a work or school excuse for 24 hours related to this e-Visit. If it has been greater than 24 hours you will need to follow up with your provider, or enter a new e-Visit to address those concerns.  You will get an e-mail in the next two days asking about your experience.  I hope  that your e-visit has been valuable and will speed your recovery. Thank you for using e-visits.   Approximately 5 minutes was used in reviewing the patient's chart, questionnaire, prescribing medications, and documentation.

## 2020-08-15 IMAGING — CT CT RENAL STONE PROTOCOL
2 of 4 series · 16 of 46 positions shown, 18 images · non-contrast
Comparison: 09/26/2016 CT abdomen/pelvis.

CLINICAL DATA: Right flank pain and hematuria. History of
nephrolithiasis.

EXAM:
CT ABDOMEN AND PELVIS WITHOUT CONTRAST
TECHNIQUE: Multidetector CT imaging of the abdomen and pelvis was performed
following the standard protocol without IV contrast.

[Series 3: renal stone 5.0 · axial · 0.67mm/px · z∈[+138,+543]mm · 13 of 89 slices shown, 15 images]
[im 4/89  soft-tissue]
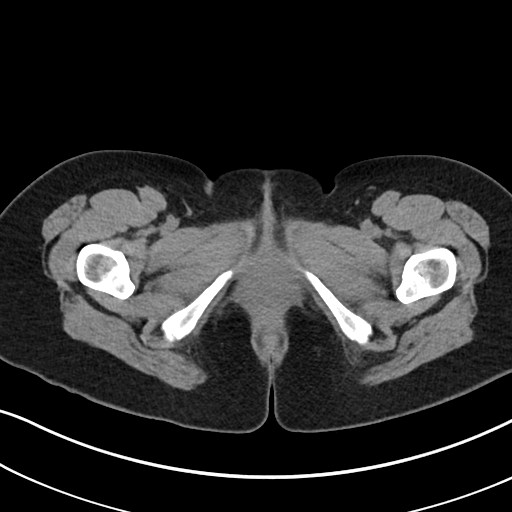
[im 4/89  bone]
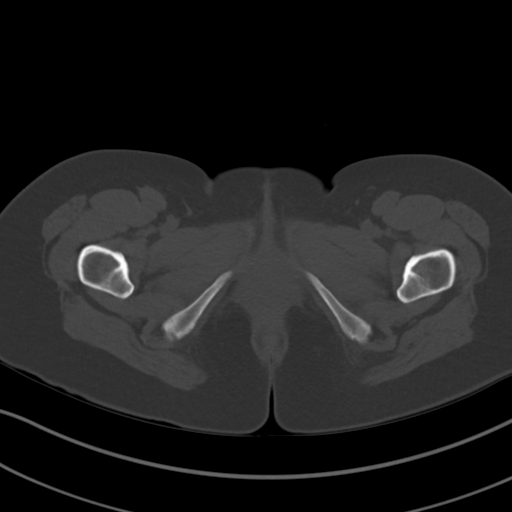
[im 12/89  soft-tissue]
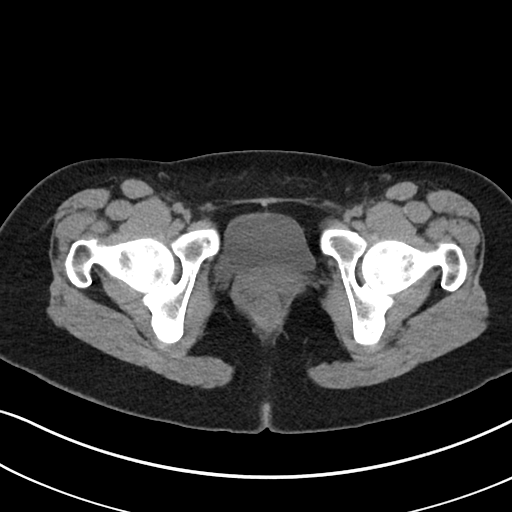
[im 19/89  soft-tissue]
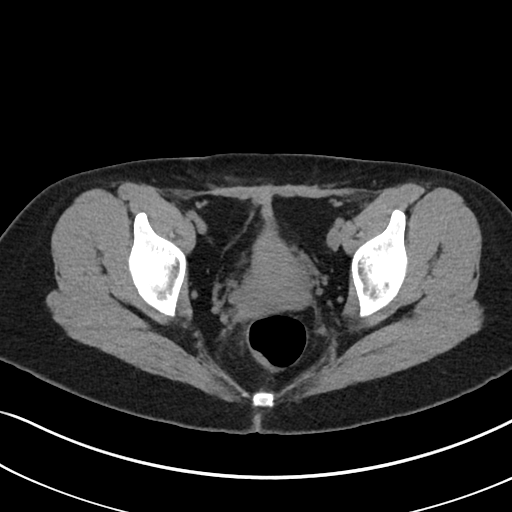
[im 26/89  soft-tissue]
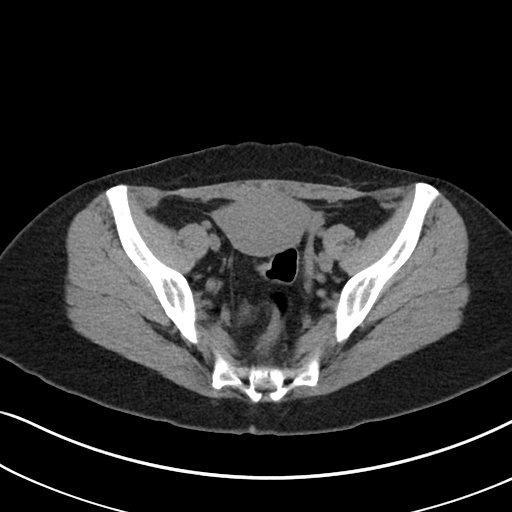
[im 30/89  soft-tissue]
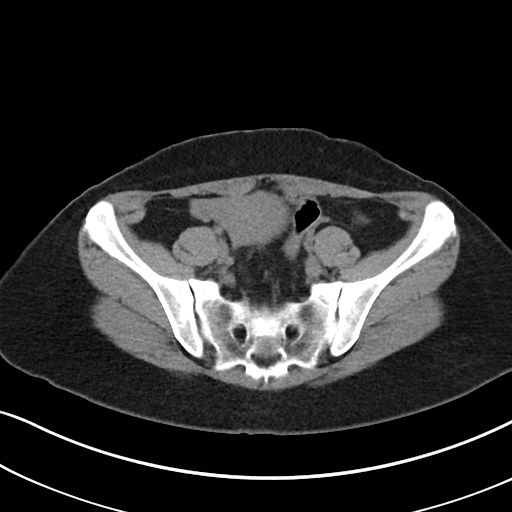
[im 37/89  soft-tissue]
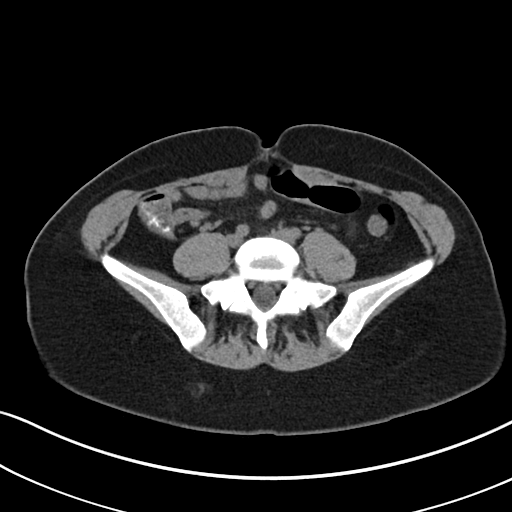
[im 45/89  soft-tissue]
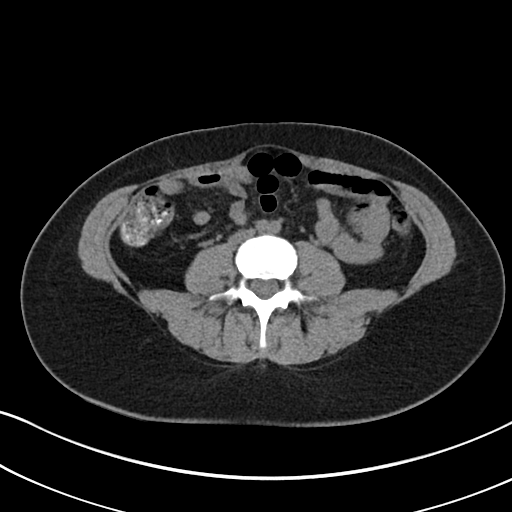
[im 52/89  soft-tissue]
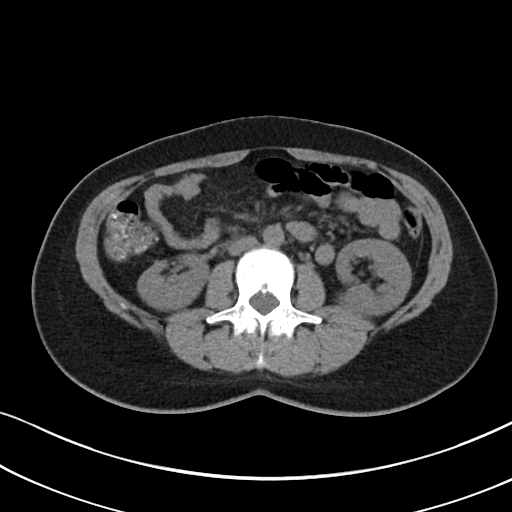
[im 59/89  soft-tissue]
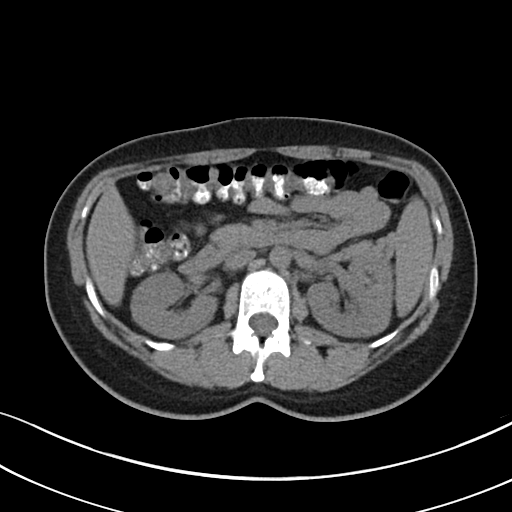
[im 59/89  bone]
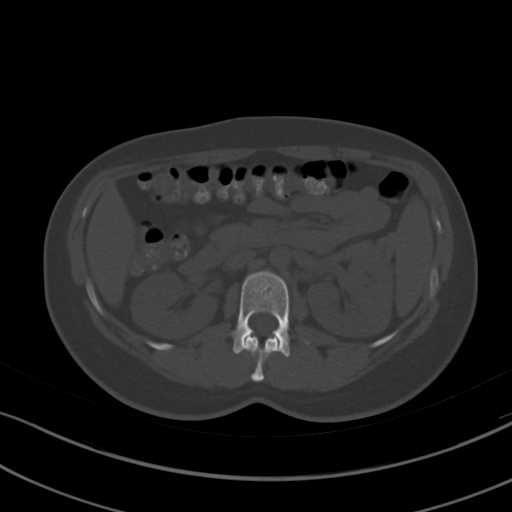
[im 63/89  soft-tissue]
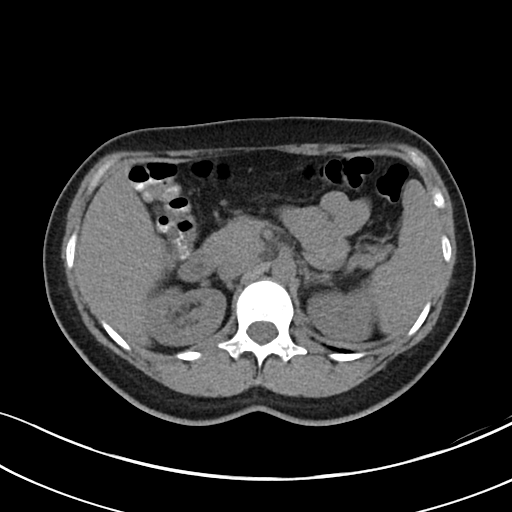
[im 70/89  soft-tissue]
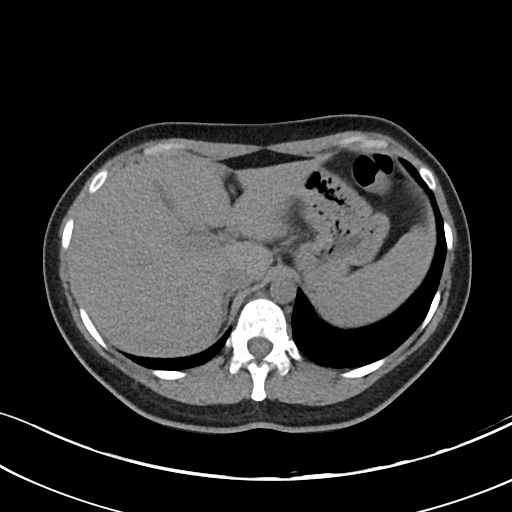
[im 78/89  soft-tissue]
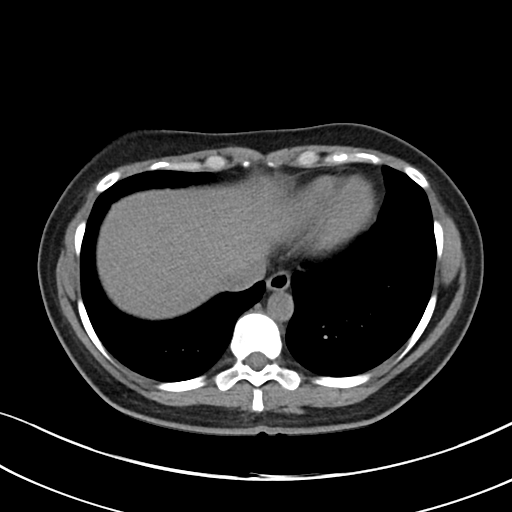
[im 85/89  soft-tissue]
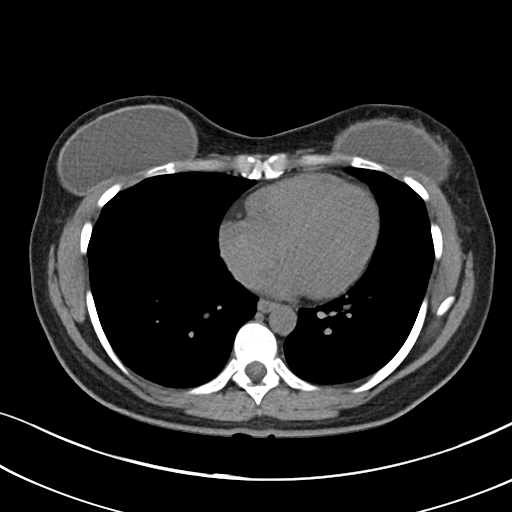

[Series 6: coronal · coronal · 0.65mm/px · 3 of 74 slices shown]
[im 25/74  soft-tissue]
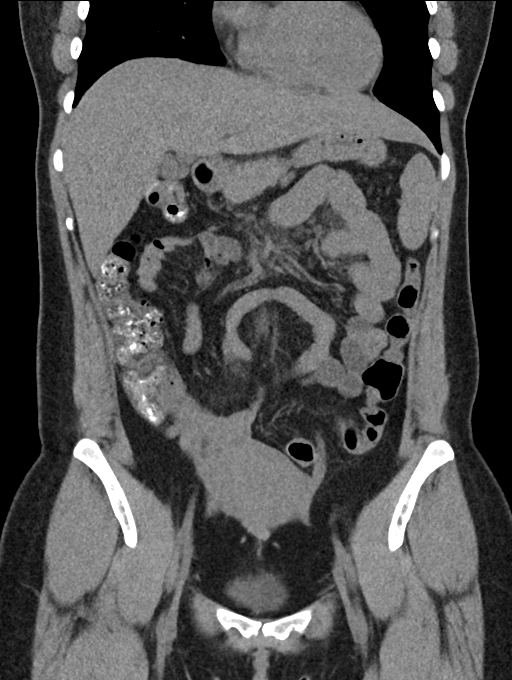
[im 33/74  soft-tissue]
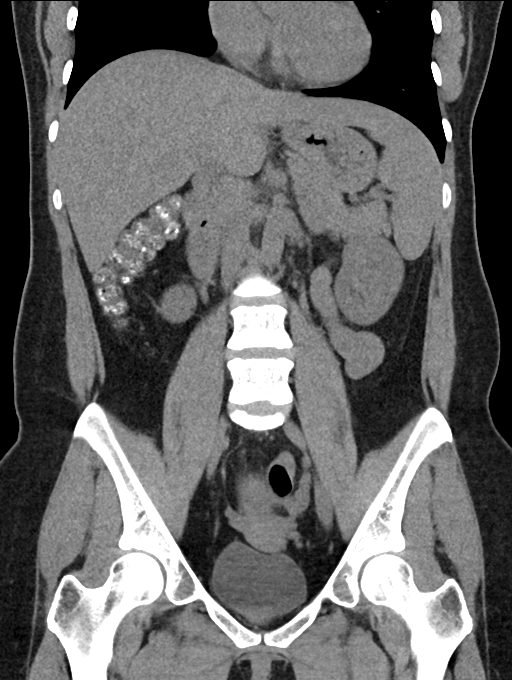
[im 41/74  soft-tissue]
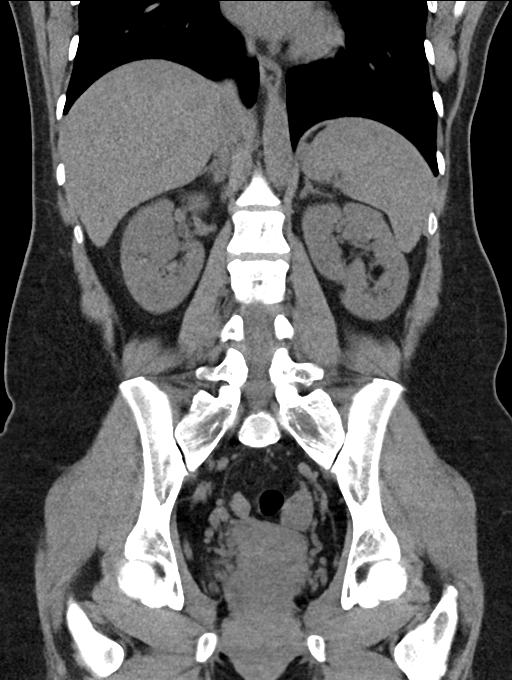

[16 of 46 positions shown; findings below may reference images not displayed]

FINDINGS: Lower chest: No significant pulmonary nodules or acute consolidative
airspace disease. Bilateral breast prostheses are partially
visualized.

Hepatobiliary: Normal liver size. Simple 1.1 cm posterior right
liver cyst (series 3/image 17). No additional liver lesions. Normal
gallbladder with no radiopaque cholelithiasis. No biliary ductal
dilatation.

Pancreas: Normal, with no mass or duct dilation.

Spleen: Normal size. No mass.

Adrenals/Urinary Tract: Normal adrenals. No renal stones. No
hydronephrosis. No contour deforming renal masses. Normal caliber
ureters. No ureteral stones. Normal bladder.

Stomach/Bowel: Normal non-distended stomach. Normal caliber small
bowel with no small bowel wall thickening. Normal appendix. Normal
large bowel with no diverticulosis, large bowel wall thickening or
pericolonic fat stranding.

Vascular/Lymphatic: Normal caliber abdominal aorta. No
pathologically enlarged lymph nodes in the abdomen or pelvis.

Reproductive: Grossly normal uterus.  No adnexal mass.

Other: No pneumoperitoneum, ascites or focal fluid collection.

Musculoskeletal: No aggressive appearing focal osseous lesions.
IMPRESSION: No acute abnormality. No urolithiasis. No hydronephrosis.

## 2020-08-22 ENCOUNTER — Other Ambulatory Visit: Payer: Self-pay | Admitting: Obstetrics and Gynecology

## 2020-08-22 DIAGNOSIS — O99013 Anemia complicating pregnancy, third trimester: Secondary | ICD-10-CM

## 2020-08-26 ENCOUNTER — Telehealth: Payer: Self-pay | Admitting: Emergency Medicine

## 2020-08-26 DIAGNOSIS — H5711 Ocular pain, right eye: Secondary | ICD-10-CM

## 2020-08-26 NOTE — Progress Notes (Signed)
Based on what you shared with me, with your pain and amount of eye swelling, I feel your condition warrants further evaluation and I recommend that you be seen in a face to face office visit to help preserve the vision in your eye.   NOTE: If you entered your credit card information for this eVisit, you will not be charged. You may see a "hold" on your card for the $35 but that hold will drop off and you will not have a charge processed.   If you are having a true medical emergency please call 911.      For an urgent face to face visit, [place] has six urgent care centers for your convenience:     [facility] [place] [facility] [place] Get Driving Directions [phone] [address] [place], [place] [address] . 8 am - 4 pm Monday - Friday    [facility] [place] [facility]) Get Driving Directions [phone] [address] [place], [place] [address] . 8 am to 8 pm Monday-Friday . 10 am to 6 pm [facility]) Get Driving Directions [phone]  [address] [place],  [place]  [address] . 8 am to 8 pm Monday-Friday . 8 am to 4 pm [facility] Get Driving Directions [phone] [place], [place], [place] [address] . 8 am to 8 pm Monday-Friday . 8 am to 4 pm [facility] at [facility] Get Driving Directions  [phone] [address] [place], [place] [address] . 8 am to 8 pm Monday-Friday . 8 am to 4 pm [facility] at [place] Get Driving Directions [phone] [address] [place], [place] [address] . 8 am to 8 pm Monday-Friday . 8 am to 4 pm Saturday-Sunday     Your MyChart E-visit questionnaire answers were reviewed by a board certified advanced clinical practitioner to complete your personal  care plan based on your specific symptoms.  Thank you for using e-Visits.    Approximately 5 minutes was spent documenting and reviewing patient's chart.

## 2020-08-26 NOTE — Progress Notes (Signed)
Based on what you shared with me, with the amount of eye swelling, I feel your condition warrants further evaluation and I recommend that you be seen in a face to face office visit to help preserve your vision.   NOTE: If you entered your credit card information for this eVisit, you will not be charged. You may see a "hold" on your card for the $35 but that hold will drop off and you will not have a charge processed.   If you are having a true medical emergency please call 911.      For an urgent face to face visit, [place] has six urgent care centers for your convenience:     [place] [facility] [place] [facility] [place] Get Driving Directions [phone] [place], [place] [address] . 8 am - 4 pm Monday - Friday    [place] [facility] [place] [facility]) Get Driving Directions [phone] [address] [place], [place] [address] . 8 am to 8 pm Monday-Friday . 10 am to 6 pm [facility]) Get Driving Directions [phone]  [address] [place],  [place]  [address] . 8 am to 8 pm Monday-Friday . 8 am to 4 pm [facility] at [facility] Get Driving Directions [phone] [place], [place], [place] [address] . 8 am to 8 pm Monday-Friday . 8 am to 4 pm [facility] at [facility] Get Driving Directions  [phone] [address] [place], [place] [address] . 8 am to 8 pm Monday-Friday . 8 am to 4 pm [facility] at [place] Get Driving Directions [phone] [address] [place],  [address] . 8 am to 8 pm Monday-Friday . 8 am to 4 pm Saturday-Sunday     Your MyChart E-visit questionnaire answers were reviewed by a board certified advanced clinical practitioner to complete your personal care plan based on your  specific symptoms.  Thank you for using e-Visits.   Approximately 5 minutes was spent documenting and reviewing patient's chart.

## 2020-08-29 ENCOUNTER — Non-Acute Institutional Stay (HOSPITAL_COMMUNITY)
Admission: RE | Admit: 2020-08-29 | Discharge: 2020-08-29 | Disposition: A | Discharge status: Home / Self Care | Payer: BC Managed Care – PPO | Source: Ambulatory Visit | Attending: Internal Medicine | Admitting: Internal Medicine

## 2020-08-29 ENCOUNTER — Other Ambulatory Visit: Payer: Self-pay

## 2020-08-29 DIAGNOSIS — O99013 Anemia complicating pregnancy, third trimester: Secondary | ICD-10-CM | POA: Insufficient documentation

## 2020-08-29 DIAGNOSIS — D649 Anemia, unspecified: Secondary | ICD-10-CM | POA: Insufficient documentation

## 2020-08-29 MED ORDER — SODIUM CHLORIDE 0.9 % IV SOLN
500.0000 mg | INTRAVENOUS | Status: DC
  Administered 2020-08-29: 500 mg via INTRAVENOUS
  Filled 2020-08-29: qty 25

## 2020-08-29 MED ORDER — DIPHENHYDRAMINE HCL 25 MG PO CAPS
25.0000 mg | ORAL_CAPSULE | ORAL | Status: DC
  Administered 2020-08-29: 25 mg via ORAL
  Filled 2020-08-29: qty 1

## 2020-08-29 MED ORDER — SODIUM CHLORIDE 0.9 % IV SOLN
INTRAVENOUS | Status: DC | PRN
  Administered 2020-08-29: 250 mL via INTRAVENOUS

## 2020-08-29 MED ORDER — ACETAMINOPHEN 500 MG PO TABS
1000.0000 mg | ORAL_TABLET | ORAL | Status: DC
  Administered 2020-08-29: 1000 mg via ORAL
  Filled 2020-08-29: qty 2

## 2020-08-29 NOTE — Progress Notes (Signed)
PATIENT CARE CENTER NOTE   Diagnosis: Anemia complicating pregnancy in third trimester (O99.013)   Provider: Brien Few, MD   Procedure: Venofer infusion    Note: Patient received 500 mg Venofer (1 out of 2) infusion via PIV. Pre-medications given per order. Patient tolerated infusion well with no adverse reaction. Vital signs stable. Discharge instructions given. Patient to come back in 2 weeks for second infusion. Patient alert, oriented and ambulatory at discharge.

## 2020-08-29 NOTE — Discharge Instructions (Signed)

## 2020-09-12 ENCOUNTER — Non-Acute Institutional Stay (HOSPITAL_COMMUNITY)
Admission: RE | Admit: 2020-09-12 | Discharge: 2020-09-12 | Disposition: A | Discharge status: Home / Self Care | Payer: BC Managed Care – PPO | Source: Ambulatory Visit | Attending: Internal Medicine | Admitting: Internal Medicine

## 2020-09-12 ENCOUNTER — Other Ambulatory Visit: Payer: Self-pay

## 2020-09-12 DIAGNOSIS — O99013 Anemia complicating pregnancy, third trimester: Secondary | ICD-10-CM | POA: Insufficient documentation

## 2020-09-12 MED ORDER — DIPHENHYDRAMINE HCL 25 MG PO CAPS
25.0000 mg | ORAL_CAPSULE | Freq: Once | ORAL | Status: AC
  Administered 2020-09-12: 25 mg via ORAL
  Filled 2020-09-12: qty 1

## 2020-09-12 MED ORDER — ACETAMINOPHEN 500 MG PO TABS
1000.0000 mg | ORAL_TABLET | Freq: Once | ORAL | Status: AC
  Administered 2020-09-12: 1000 mg via ORAL
  Filled 2020-09-12: qty 2

## 2020-09-12 MED ORDER — SODIUM CHLORIDE 0.9 % IV SOLN
500.0000 mg | Freq: Once | INTRAVENOUS | Status: AC
  Administered 2020-09-12: 500 mg via INTRAVENOUS
  Filled 2020-09-12: qty 25

## 2020-09-12 MED ORDER — SODIUM CHLORIDE 0.9 % IV SOLN
INTRAVENOUS | Status: DC | PRN
  Administered 2020-09-12: 250 mL via INTRAVENOUS

## 2020-09-12 NOTE — Progress Notes (Signed)
PATIENT CARE CENTER NOTE   Diagnosis: Anemia complicating pregnancy in third trimester (O99.013)   Provider: Brien Few, MD   Procedure: Venofer infusion    Note: Patient received 500 mg Venofer (2 out of 2) infusion via PIV. Pre-medications given per order. Patient tolerated infusion well with no adverse reaction. Vital signs stable. Discharge instructions given. Patient alert, oriented and ambulatory at discharge.

## 2020-09-12 NOTE — Discharge Instructions (Signed)

## 2020-09-25 ENCOUNTER — Other Ambulatory Visit: Payer: Self-pay | Admitting: Obstetrics and Gynecology

## 2020-10-21 ENCOUNTER — Other Ambulatory Visit: Payer: Self-pay | Admitting: Obstetrics and Gynecology

## 2020-10-21 DIAGNOSIS — O99013 Anemia complicating pregnancy, third trimester: Secondary | ICD-10-CM

## 2020-10-23 ENCOUNTER — Encounter (HOSPITAL_COMMUNITY): Payer: Self-pay | Admitting: *Deleted

## 2020-10-23 ENCOUNTER — Telehealth (HOSPITAL_COMMUNITY): Payer: Self-pay | Admitting: *Deleted

## 2020-10-23 NOTE — Telephone Encounter (Signed)
Preadmission screen  

## 2020-10-24 ENCOUNTER — Telehealth (HOSPITAL_COMMUNITY): Payer: Self-pay | Admitting: *Deleted

## 2020-10-24 NOTE — Telephone Encounter (Signed)
Preadmission screen  

## 2020-10-25 ENCOUNTER — Other Ambulatory Visit: Payer: Self-pay

## 2020-10-25 ENCOUNTER — Non-Acute Institutional Stay (HOSPITAL_COMMUNITY)
Admission: RE | Admit: 2020-10-25 | Discharge: 2020-10-25 | Disposition: A | Discharge status: Home / Self Care | Payer: BC Managed Care – PPO | Source: Ambulatory Visit | Attending: Internal Medicine | Admitting: Internal Medicine

## 2020-10-25 DIAGNOSIS — Z3A00 Weeks of gestation of pregnancy not specified: Secondary | ICD-10-CM | POA: Diagnosis not present

## 2020-10-25 DIAGNOSIS — O99013 Anemia complicating pregnancy, third trimester: Secondary | ICD-10-CM

## 2020-10-25 MED ORDER — DIPHENHYDRAMINE HCL 25 MG PO CAPS
25.0000 mg | ORAL_CAPSULE | Freq: Once | ORAL | Status: DC
Start: 2020-10-25 — End: 2020-10-26

## 2020-10-25 MED ORDER — SODIUM CHLORIDE 0.9 % IV SOLN
INTRAVENOUS | Status: DC | PRN
  Administered 2020-10-25: 250 mL via INTRAVENOUS

## 2020-10-25 MED ORDER — SODIUM CHLORIDE 0.9 % IV SOLN
500.0000 mg | Freq: Once | INTRAVENOUS | Status: AC
  Administered 2020-10-25: 500 mg via INTRAVENOUS
  Filled 2020-10-25: qty 25

## 2020-10-25 MED ORDER — ACETAMINOPHEN 500 MG PO TABS
500.0000 mg | ORAL_TABLET | Freq: Once | ORAL | Status: AC
  Administered 2020-10-25: 500 mg via ORAL
  Filled 2020-10-25: qty 1

## 2020-10-25 NOTE — Progress Notes (Signed)
PATIENT CARE CENTER NOTE     Diagnosis: Anemia affecting pregnancy in third trimester (O99.013)     Provider: Brien Few, MD     Procedure: Venofer infusion      Note: Patient received 500 mg Venofer (1 of 1) infusion via PIV.  Tylenol and Benadryl ordered as pre medications. Patient took Tylenol but refused Benadryl. Patient tolerated infusion well with no adverse reaction. Vital signs stable. AVS offered but patient refused. Patient alert, oriented and ambulatory at discharge.

## 2020-10-28 ENCOUNTER — Encounter (HOSPITAL_COMMUNITY): Payer: Self-pay

## 2020-10-28 NOTE — Patient Instructions (Signed)
Shelly [name]  10/28/2020   Your procedure is scheduled on:  11/06/2020  Arrive at 0600 at Entrance C on [address] at [facility]  and [employer]. You are invited to use the FREE valet parking or use the Visitor's parking deck.  Pick up the phone at the desk and dial [phone].  Call this number if you have problems the morning of surgery: [phone]  Remember:   Do not eat food:(After Midnight) Desps de medianoche.  Do not drink clear liquids: (After Midnight) Desps de medianoche.  Take these medicines the morning of surgery with A SIP OF WATER:  none   Do not wear jewelry, make-up or nail polish.  Do not wear lotions, powders, or perfumes. Do not wear deodorant.  Do not shave 48 hours prior to surgery.  Do not bring valuables to the hospital.  [facility] is not   responsible for any belongings or valuables brought to the hospital.  Contacts, dentures or bridgework may not be worn into surgery.  Leave suitcase in the car. After surgery it may be brought to your room.  For patients admitted to the hospital, checkout time is 11:00 AM the day of              discharge.      Please read over the following fact sheets that you were given:     Preparing for Surgery

## 2020-11-04 ENCOUNTER — Encounter (HOSPITAL_COMMUNITY)
Admission: RE | Admit: 2020-11-04 | Discharge: 2020-11-04 | Disposition: A | Discharge status: Home / Self Care | Payer: BC Managed Care – PPO | Source: Ambulatory Visit | Attending: Obstetrics and Gynecology | Admitting: Obstetrics and Gynecology

## 2020-11-04 ENCOUNTER — Other Ambulatory Visit: Payer: Self-pay

## 2020-11-04 ENCOUNTER — Other Ambulatory Visit (HOSPITAL_COMMUNITY)
Admission: RE | Admit: 2020-11-04 | Discharge: 2020-11-04 | Disposition: A | Discharge status: Home / Self Care | Payer: BC Managed Care – PPO | Source: Ambulatory Visit | Attending: Obstetrics and Gynecology | Admitting: Obstetrics and Gynecology

## 2020-11-04 DIAGNOSIS — Z20822 Contact with and (suspected) exposure to covid-19: Secondary | ICD-10-CM | POA: Insufficient documentation

## 2020-11-04 DIAGNOSIS — Z01812 Encounter for preprocedural laboratory examination: Secondary | ICD-10-CM | POA: Insufficient documentation

## 2020-11-04 LAB — CBC
HCT: 37.5 % (ref 36.0–46.0)
Hemoglobin: 11.9 g/dL — ABNORMAL LOW (ref 12.0–15.0)
MCH: 28.2 pg (ref 26.0–34.0)
MCHC: 31.7 g/dL (ref 30.0–36.0)
MCV: 88.9 fL (ref 80.0–100.0)
Platelets: 208 10*3/uL (ref 150–400)
RBC: 4.22 MIL/uL (ref 3.87–5.11)
RDW: 19.7 % — ABNORMAL HIGH (ref 11.5–15.5)
WBC: 7.8 10*3/uL (ref 4.0–10.5)
nRBC: 0 % (ref 0.0–0.2)

## 2020-11-04 LAB — SARS CORONAVIRUS 2 (TAT 6-24 HRS): SARS Coronavirus 2: NEGATIVE

## 2020-11-04 LAB — PREPARE RBC (CROSSMATCH)

## 2020-11-05 ENCOUNTER — Encounter (HOSPITAL_COMMUNITY): Payer: Self-pay | Admitting: Obstetrics and Gynecology

## 2020-11-05 LAB — RPR: RPR Ser Ql: NONREACTIVE

## 2020-11-05 NOTE — H&P (Signed)
Shelly [name] is a 35 y.o. female presenting for rpt csection with history of previous csection  4. Wants TL.. OB History     Gravida  5   Para  4   Term  4   Preterm  0   AB  0   Living  4      SAB  0   IAB  0   Ectopic  0   Multiple  0   Live Births  1          Past Medical History:  Diagnosis Date   Anemia    Anxiety    Chronic kidney disease    Endometriosis of pelvis 04/21/2012   Diagnosed on pathology after operative laparoscopy on 04/21/2012    PONV (postoperative nausea and vomiting)    Past Surgical History:  Procedure Laterality Date   BREAST ENHANCEMENT SURGERY     BREAST SURGERY     CESAREAN SECTION  09/17/00, 08/31/02, 06/14/08   x 3   CESAREAN SECTION N/A 11/29/2017   Procedure: Repeat CESAREAN SECTION;  Surgeon: Brien Few, MD;  Location: [facility];  Service: Obstetrics;  Laterality: N/A;  EDD: 12/05/17   CHROMOPERTUBATION  04/21/2012   Procedure: CHROMOPERTUBATION;  Surgeon: Osborne Oman, MD;  Location: [facility];  Service: Gynecology;  Laterality: N/A;   LAPAROSCOPIC LYSIS OF ADHESIONS  04/21/2012   Procedure: LAPAROSCOPIC LYSIS OF ADHESIONS;  Surgeon: Osborne Oman, MD;  Location: [facility];  Service: Gynecology;  Laterality: N/A;   LAPAROSCOPY  04/21/2012   Procedure: LAPAROSCOPY OPERATIVE;  Surgeon: Osborne Oman, MD;  Location: [facility];  Service: Gynecology;  Laterality: N/A;  Peritoneal biopsies   Family History: family history includes Breast cancer in her mother and paternal aunt; Colon cancer in an other family member; Heart attack in her father. Social History:  reports that she has never smoked. She has never used smokeless tobacco. She reports that she does not drink alcohol and does not use drugs.     Maternal Diabetes: No Genetic Screening: Normal Maternal Ultrasounds/Referrals: Normal Fetal Ultrasounds or other Referrals:  None Maternal Substance Abuse:  No Significant Maternal Medications:  None Significant  Maternal Lab Results:  Group B Strep negative Other Comments:  None  Review of Systems  Constitutional: Negative.   All other systems reviewed and are negative. Maternal Medical History:  Fetal activity: Perceived fetal activity is normal.   Last perceived fetal movement was within the past hour.   Prenatal complications: no prenatal complications Prenatal Complications - Diabetes: none.    There were no vitals taken for this visit. Maternal Exam:  Uterine Assessment: Contraction strength is mild.  Contraction frequency is rare.  Abdomen: Patient reports no abdominal tenderness. Fetal presentation: vertex Introitus: Normal vulva. Ferning test: not done.  Nitrazine test: not done. Pelvis: of concern for delivery.    Physical Exam Vitals and nursing note reviewed.  Constitutional:      Appearance: Normal appearance. She is normal weight.  HENT:     Head: Atraumatic.  Cardiovascular:     Rate and Rhythm: Normal rate and regular rhythm.  Pulmonary:     Effort: Pulmonary effort is normal.     Breath sounds: Normal breath sounds.  Abdominal:     General: Abdomen is flat.     Palpations: Abdomen is soft.  Genitourinary:    General: Normal vulva.  Musculoskeletal:        General: Normal range of motion.     Cervical back:  Normal range of motion and neck supple.  Skin:    General: Skin is warm and dry.  Neurological:     General: No focal deficit present.     Mental Status: She is alert and oriented to person, place, and time.  Psychiatric:        Mood and Affect: Mood normal.        Behavior: Behavior normal.    Prenatal labs: ABO, Rh: --/--/O POS (07/25 1055) Antibody: NEG (07/25 1055) Rubella:  imm RPR: NON REACTIVE (07/25 1049)  HBsAg:   neg HIV:   neg GBS:   neg  Assessment/Plan: Previous csection x 4 for rpt and TL Gestational anemia s/p fe infusion. Surgical risks discussed. Risks of infection, bleeding , injury to surrounding organs with need for repair  discussed.  Failure risks of TL noted Consent done.   Admiral Marcucci J 11/05/2020, 9:15 PM

## 2020-11-06 ENCOUNTER — Other Ambulatory Visit: Payer: Self-pay

## 2020-11-06 ENCOUNTER — Inpatient Hospital Stay (HOSPITAL_COMMUNITY): Payer: BC Managed Care – PPO | Admitting: Anesthesiology

## 2020-11-06 ENCOUNTER — Encounter (HOSPITAL_COMMUNITY)
Admission: RE | Disposition: A | Discharge status: Home / Self Care | Payer: Self-pay | Source: Home / Self Care | Attending: Obstetrics and Gynecology

## 2020-11-06 ENCOUNTER — Encounter (HOSPITAL_COMMUNITY): Payer: Self-pay | Admitting: Obstetrics and Gynecology

## 2020-11-06 ENCOUNTER — Inpatient Hospital Stay (HOSPITAL_COMMUNITY)
Admission: RE | Admit: 2020-11-06 | Discharge: 2020-11-08 | DRG: 787 | Disposition: A | Discharge status: Home / Self Care | Payer: BC Managed Care – PPO | Attending: Obstetrics and Gynecology | Admitting: Obstetrics and Gynecology

## 2020-11-06 DIAGNOSIS — Z3A39 39 weeks gestation of pregnancy: Secondary | ICD-10-CM

## 2020-11-06 DIAGNOSIS — D62 Acute posthemorrhagic anemia: Secondary | ICD-10-CM | POA: Diagnosis not present

## 2020-11-06 DIAGNOSIS — O9081 Anemia of the puerperium: Secondary | ICD-10-CM | POA: Diagnosis not present

## 2020-11-06 DIAGNOSIS — Z98891 History of uterine scar from previous surgery: Secondary | ICD-10-CM

## 2020-11-06 DIAGNOSIS — F419 Anxiety disorder, unspecified: Secondary | ICD-10-CM | POA: Diagnosis present

## 2020-11-06 DIAGNOSIS — Z20822 Contact with and (suspected) exposure to covid-19: Secondary | ICD-10-CM | POA: Diagnosis present

## 2020-11-06 DIAGNOSIS — O34211 Maternal care for low transverse scar from previous cesarean delivery: Principal | ICD-10-CM | POA: Diagnosis present

## 2020-11-06 DIAGNOSIS — O34219 Maternal care for unspecified type scar from previous cesarean delivery: Secondary | ICD-10-CM | POA: Diagnosis present

## 2020-11-06 DIAGNOSIS — Z862 Personal history of diseases of the blood and blood-forming organs and certain disorders involving the immune mechanism: Secondary | ICD-10-CM

## 2020-11-06 SURGERY — Surgical Case
Anesthesia: Spinal

## 2020-11-06 MED ORDER — OXYCODONE HCL 5 MG PO TABS
5.0000 mg | ORAL_TABLET | ORAL | Status: DC | PRN
  Administered 2020-11-06 – 2020-11-08 (×6): 5 mg via ORAL
  Filled 2020-11-06 (×6): qty 1

## 2020-11-06 MED ORDER — BUPIVACAINE IN DEXTROSE 0.75-8.25 % IT SOLN
INTRATHECAL | Status: DC | PRN
  Administered 2020-11-06: 1.8 mL via INTRATHECAL

## 2020-11-06 MED ORDER — BUPIVACAINE HCL (PF) 0.25 % IJ SOLN
INTRAMUSCULAR | Status: AC
  Filled 2020-11-06: qty 20

## 2020-11-06 MED ORDER — NALBUPHINE HCL 10 MG/ML IJ SOLN: 5.0000 mg | Freq: Once | INTRAMUSCULAR | Status: DC | PRN

## 2020-11-06 MED ORDER — CEFAZOLIN SODIUM-DEXTROSE 2-4 GM/100ML-% IV SOLN
INTRAVENOUS | Status: AC
  Filled 2020-11-06: qty 100

## 2020-11-06 MED ORDER — LACTATED RINGERS IV SOLN: INTRAVENOUS | Status: DC

## 2020-11-06 MED ORDER — STERILE WATER FOR IRRIGATION IR SOLN
Status: DC | PRN
  Administered 2020-11-06: 1

## 2020-11-06 MED ORDER — KETOROLAC TROMETHAMINE 30 MG/ML IJ SOLN
30.0000 mg | Freq: Four times a day (QID) | INTRAMUSCULAR | Status: AC
  Administered 2020-11-06 – 2020-11-07 (×4): 30 mg via INTRAVENOUS
  Filled 2020-11-06 (×4): qty 1

## 2020-11-06 MED ORDER — NALOXONE HCL 0.4 MG/ML IJ SOLN: 0.4000 mg | INTRAMUSCULAR | Status: DC | PRN

## 2020-11-06 MED ORDER — KETOROLAC TROMETHAMINE 30 MG/ML IJ SOLN
30.0000 mg | Freq: Once | INTRAMUSCULAR | Status: AC | PRN
  Administered 2020-11-06: 30 mg via INTRAVENOUS

## 2020-11-06 MED ORDER — PHENYLEPHRINE HCL-NACL 20-0.9 MG/250ML-% IV SOLN
INTRAVENOUS | Status: DC | PRN
  Administered 2020-11-06: 60 ug/min via INTRAVENOUS

## 2020-11-06 MED ORDER — ACETAMINOPHEN 10 MG/ML IV SOLN
INTRAVENOUS | Status: AC
  Filled 2020-11-06: qty 100

## 2020-11-06 MED ORDER — PHENYLEPHRINE HCL-NACL 20-0.9 MG/250ML-% IV SOLN
INTRAVENOUS | Status: AC
  Filled 2020-11-06: qty 250

## 2020-11-06 MED ORDER — DIPHENHYDRAMINE HCL 50 MG/ML IJ SOLN: 12.5000 mg | INTRAMUSCULAR | Status: DC | PRN

## 2020-11-06 MED ORDER — DIPHENHYDRAMINE HCL 25 MG PO CAPS: 25.0000 mg | ORAL_CAPSULE | Freq: Four times a day (QID) | ORAL | Status: DC | PRN

## 2020-11-06 MED ORDER — SCOPOLAMINE 1 MG/3DAYS TD PT72
MEDICATED_PATCH | TRANSDERMAL | Status: AC
  Filled 2020-11-06: qty 1

## 2020-11-06 MED ORDER — CEFAZOLIN SODIUM-DEXTROSE 2-4 GM/100ML-% IV SOLN
2.0000 g | INTRAVENOUS | Status: AC
  Administered 2020-11-06: 2 g via INTRAVENOUS

## 2020-11-06 MED ORDER — POVIDONE-IODINE 10 % EX SWAB: 2.0000 "application " | Freq: Once | CUTANEOUS | Status: DC

## 2020-11-06 MED ORDER — DIBUCAINE (PERIANAL) 1 % EX OINT: 1.0000 "application " | TOPICAL_OINTMENT | CUTANEOUS | Status: DC | PRN

## 2020-11-06 MED ORDER — ONDANSETRON HCL 4 MG/2ML IJ SOLN
INTRAMUSCULAR | Status: AC
  Filled 2020-11-06: qty 2

## 2020-11-06 MED ORDER — DEXTROSE 5 % IV SOLN
1.0000 ug/kg/h | INTRAVENOUS | Status: DC | PRN
  Filled 2020-11-06: qty 5

## 2020-11-06 MED ORDER — KETOROLAC TROMETHAMINE 30 MG/ML IJ SOLN
INTRAMUSCULAR | Status: AC
  Filled 2020-11-06: qty 1

## 2020-11-06 MED ORDER — SODIUM CHLORIDE 0.9% FLUSH: 3.0000 mL | INTRAVENOUS | Status: DC | PRN

## 2020-11-06 MED ORDER — DEXAMETHASONE SODIUM PHOSPHATE 4 MG/ML IJ SOLN
INTRAMUSCULAR | Status: DC | PRN
  Administered 2020-11-06: 10 mg via INTRAVENOUS

## 2020-11-06 MED ORDER — COCONUT OIL OIL
1.0000 "application " | TOPICAL_OIL | Status: DC | PRN
  Administered 2020-11-06: 1 via TOPICAL

## 2020-11-06 MED ORDER — SIMETHICONE 80 MG PO CHEW
80.0000 mg | CHEWABLE_TABLET | Freq: Three times a day (TID) | ORAL | Status: DC
  Administered 2020-11-06 – 2020-11-08 (×6): 80 mg via ORAL
  Filled 2020-11-06 (×6): qty 1

## 2020-11-06 MED ORDER — SODIUM CHLORIDE 0.9 % IR SOLN
Status: DC | PRN
  Administered 2020-11-06: 1

## 2020-11-06 MED ORDER — BUPIVACAINE HCL (PF) 0.25 % IJ SOLN
INTRAMUSCULAR | Status: DC | PRN
  Administered 2020-11-06: 20 mL

## 2020-11-06 MED ORDER — TRANEXAMIC ACID-NACL 1000-0.7 MG/100ML-% IV SOLN
INTRAVENOUS | Status: AC
  Filled 2020-11-06: qty 100

## 2020-11-06 MED ORDER — DEXAMETHASONE SODIUM PHOSPHATE 10 MG/ML IJ SOLN
INTRAMUSCULAR | Status: AC
  Filled 2020-11-06: qty 1

## 2020-11-06 MED ORDER — METHYLERGONOVINE MALEATE 0.2 MG PO TABS
0.2000 mg | ORAL_TABLET | ORAL | Status: DC | PRN
Start: 2020-11-06 — End: 2020-11-08

## 2020-11-06 MED ORDER — OXYTOCIN-SODIUM CHLORIDE 30-0.9 UT/500ML-% IV SOLN
INTRAVENOUS | Status: AC
  Filled 2020-11-06: qty 500

## 2020-11-06 MED ORDER — MENTHOL 3 MG MT LOZG: 1.0000 | LOZENGE | OROMUCOSAL | Status: DC | PRN

## 2020-11-06 MED ORDER — OXYTOCIN-SODIUM CHLORIDE 30-0.9 UT/500ML-% IV SOLN
2.5000 [IU]/h | INTRAVENOUS | Status: AC
  Administered 2020-11-06: 2.5 [IU]/h via INTRAVENOUS
  Filled 2020-11-06: qty 500

## 2020-11-06 MED ORDER — KETOROLAC TROMETHAMINE 30 MG/ML IJ SOLN: 30.0000 mg | Freq: Four times a day (QID) | INTRAMUSCULAR | Status: AC | PRN

## 2020-11-06 MED ORDER — OXYCODONE-ACETAMINOPHEN 5-325 MG PO TABS: 1.0000 | ORAL_TABLET | ORAL | Status: DC | PRN

## 2020-11-06 MED ORDER — NALBUPHINE HCL 10 MG/ML IJ SOLN: 5.0000 mg | INTRAMUSCULAR | Status: DC | PRN

## 2020-11-06 MED ORDER — MORPHINE SULFATE (PF) 0.5 MG/ML IJ SOLN
INTRAMUSCULAR | Status: DC | PRN
  Administered 2020-11-06: 150 ug via INTRATHECAL

## 2020-11-06 MED ORDER — ONDANSETRON HCL 4 MG/2ML IJ SOLN
4.0000 mg | Freq: Three times a day (TID) | INTRAMUSCULAR | Status: DC | PRN
  Administered 2020-11-06: 4 mg via INTRAVENOUS

## 2020-11-06 MED ORDER — DIPHENHYDRAMINE HCL 25 MG PO CAPS: 25.0000 mg | ORAL_CAPSULE | ORAL | Status: DC | PRN

## 2020-11-06 MED ORDER — FENTANYL CITRATE (PF) 100 MCG/2ML IJ SOLN: 25.0000 ug | INTRAMUSCULAR | Status: DC | PRN

## 2020-11-06 MED ORDER — ACETAMINOPHEN 500 MG PO TABS
1000.0000 mg | ORAL_TABLET | Freq: Four times a day (QID) | ORAL | Status: AC
  Administered 2020-11-06 – 2020-11-07 (×3): 1000 mg via ORAL
  Filled 2020-11-06 (×3): qty 2

## 2020-11-06 MED ORDER — SCOPOLAMINE 1 MG/3DAYS TD PT72
1.0000 | MEDICATED_PATCH | Freq: Once | TRANSDERMAL | Status: DC
  Administered 2020-11-06: 1.5 mg via TRANSDERMAL

## 2020-11-06 MED ORDER — SIMETHICONE 80 MG PO CHEW: 80.0000 mg | CHEWABLE_TABLET | ORAL | Status: DC | PRN

## 2020-11-06 MED ORDER — ACETAMINOPHEN 10 MG/ML IV SOLN
1000.0000 mg | Freq: Once | INTRAVENOUS | Status: DC | PRN
  Administered 2020-11-06: 1000 mg via INTRAVENOUS

## 2020-11-06 MED ORDER — SENNOSIDES-DOCUSATE SODIUM 8.6-50 MG PO TABS
2.0000 | ORAL_TABLET | Freq: Every day | ORAL | Status: DC
  Administered 2020-11-08: 2 via ORAL
  Filled 2020-11-06 (×2): qty 2

## 2020-11-06 MED ORDER — MORPHINE SULFATE (PF) 0.5 MG/ML IJ SOLN
INTRAMUSCULAR | Status: AC
  Filled 2020-11-06: qty 10

## 2020-11-06 MED ORDER — FENTANYL CITRATE (PF) 100 MCG/2ML IJ SOLN
INTRAMUSCULAR | Status: DC | PRN
  Administered 2020-11-06: 15 ug via INTRATHECAL

## 2020-11-06 MED ORDER — TETANUS-DIPHTH-ACELL PERTUSSIS 5-2.5-18.5 LF-MCG/0.5 IM SUSY: 0.5000 mL | PREFILLED_SYRINGE | Freq: Once | INTRAMUSCULAR | Status: DC

## 2020-11-06 MED ORDER — WITCH HAZEL-GLYCERIN EX PADS: 1.0000 "application " | MEDICATED_PAD | CUTANEOUS | Status: DC | PRN

## 2020-11-06 MED ORDER — PRENATAL MULTIVITAMIN CH
1.0000 | ORAL_TABLET | Freq: Every day | ORAL | Status: DC
  Filled 2020-11-06 (×3): qty 1

## 2020-11-06 MED ORDER — FENTANYL CITRATE (PF) 100 MCG/2ML IJ SOLN
INTRAMUSCULAR | Status: AC
  Filled 2020-11-06: qty 2

## 2020-11-06 MED ORDER — IBUPROFEN 600 MG PO TABS
600.0000 mg | ORAL_TABLET | Freq: Four times a day (QID) | ORAL | Status: DC
  Administered 2020-11-07 – 2020-11-08 (×4): 600 mg via ORAL
  Filled 2020-11-06 (×4): qty 1

## 2020-11-06 MED ORDER — ZOLPIDEM TARTRATE 5 MG PO TABS: 5.0000 mg | ORAL_TABLET | Freq: Every evening | ORAL | Status: DC | PRN

## 2020-11-06 MED ORDER — METHYLERGONOVINE MALEATE 0.2 MG/ML IJ SOLN
0.2000 mg | INTRAMUSCULAR | Status: DC | PRN
Start: 2020-11-06 — End: 2020-11-08

## 2020-11-06 MED ORDER — ONDANSETRON HCL 4 MG/2ML IJ SOLN
INTRAMUSCULAR | Status: DC | PRN
  Administered 2020-11-06: 4 mg via INTRAVENOUS

## 2020-11-06 SURGICAL SUPPLY — 38 items
APL SKNCLS STERI-STRIP NONHPOA (GAUZE/BANDAGES/DRESSINGS) ×1
BENZOIN TINCTURE PRP APPL 2/3 (GAUZE/BANDAGES/DRESSINGS) ×1 IMPLANT
CHLORAPREP W/TINT 26ML (MISCELLANEOUS) ×2 IMPLANT
CLAMP CORD UMBIL (MISCELLANEOUS) IMPLANT
CLOTH BEACON ORANGE TIMEOUT ST (SAFETY) ×2 IMPLANT
DRSG OPSITE POSTOP 4X10 (GAUZE/BANDAGES/DRESSINGS) ×2 IMPLANT
ELECT REM PT RETURN 9FT ADLT (ELECTROSURGICAL) ×2
ELECTRODE REM PT RTRN 9FT ADLT (ELECTROSURGICAL) ×1 IMPLANT
EXTRACTOR VACUUM M CUP 4 TUBE (SUCTIONS) IMPLANT
GLOVE BIO SURGEON STRL SZ7.5 (GLOVE) ×2 IMPLANT
GLOVE BIOGEL PI IND STRL 7.0 (GLOVE) ×1 IMPLANT
GLOVE BIOGEL PI INDICATOR 7.0 (GLOVE) ×1
GOWN STRL REUS W/TWL LRG LVL3 (GOWN DISPOSABLE) ×4 IMPLANT
KIT ABG SYR 3ML LUER SLIP (SYRINGE) IMPLANT
NDL HYPO 25X5/8 SAFETYGLIDE (NEEDLE) IMPLANT
NDL SPNL 20GX3.5 QUINCKE YW (NEEDLE) IMPLANT
NEEDLE HYPO 22GX1.5 SAFETY (NEEDLE) ×2 IMPLANT
NEEDLE HYPO 25X5/8 SAFETYGLIDE (NEEDLE) IMPLANT
NEEDLE SPNL 20GX3.5 QUINCKE YW (NEEDLE) IMPLANT
NS IRRIG 1000ML POUR BTL (IV SOLUTION) ×2 IMPLANT
PACK C SECTION WH (CUSTOM PROCEDURE TRAY) ×2 IMPLANT
PENCIL SMOKE EVAC W/HOLSTER (ELECTROSURGICAL) ×2 IMPLANT
STRIP CLOSURE SKIN 1/2X4 (GAUZE/BANDAGES/DRESSINGS) ×1 IMPLANT
SUT MNCRL 0 VIOLET CTX 36 (SUTURE) ×2 IMPLANT
SUT MNCRL AB 3-0 PS2 27 (SUTURE) IMPLANT
SUT MON AB 2-0 CT1 27 (SUTURE) ×2 IMPLANT
SUT MON AB-0 CT1 36 (SUTURE) ×4 IMPLANT
SUT MONOCRYL 0 CTX 36 (SUTURE) ×4
SUT PLAIN 0 NONE (SUTURE) ×1 IMPLANT
SUT PLAIN 2 0 (SUTURE) ×2
SUT PLAIN 2 0 XLH (SUTURE) IMPLANT
SUT PLAIN ABS 2-0 CT1 27XMFL (SUTURE) IMPLANT
SUT VIC AB 4-0 KS 27 (SUTURE) ×1 IMPLANT
SYR 20CC LL (SYRINGE) IMPLANT
SYR CONTROL 10ML LL (SYRINGE) ×2 IMPLANT
TOWEL OR 17X24 6PK STRL BLUE (TOWEL DISPOSABLE) ×2 IMPLANT
TRAY FOLEY W/BAG SLVR 14FR LF (SET/KITS/TRAYS/PACK) ×2 IMPLANT
WATER STERILE IRR 1000ML POUR (IV SOLUTION) ×2 IMPLANT

## 2020-11-06 NOTE — Anesthesia Postprocedure Evaluation (Signed)
Anesthesia Post Note  Patient: Laurell Singletary  Procedure(s) Performed: Repeat CESAREAN SECTION     Patient location during evaluation: PACU Anesthesia Type: Spinal Level of consciousness: oriented and awake and alert Pain management: pain level controlled Vital Signs Assessment: post-procedure vital signs reviewed and stable Respiratory status: spontaneous breathing, respiratory function stable and patient connected to nasal cannula oxygen Cardiovascular status: blood pressure returned to baseline and stable Postop Assessment: no headache, no backache and no apparent nausea or vomiting Anesthetic complications: no   No notable events documented.  Last Vitals:  Vitals:   11/06/20 1020 11/06/20 1031  BP: 115/84 116/64  Pulse: 76 73  Resp: 20 16  Temp:    SpO2: 100% 100%    Last Pain:  Vitals:   11/06/20 1000  TempSrc:   PainSc: 0-No pain   Pain Goal:                Epidural/Spinal Function Cutaneous sensation: Able to Discern Pressure (11/06/20 1000), Patient able to flex knees: No (11/06/20 1000), Patient able to lift hips off bed: No (11/06/20 1000), Back pain beyond tenderness at insertion site: No (11/06/20 1000), Progressively worsening motor and/or sensory loss: No (11/06/20 1000), Bowel and/or bladder incontinence post epidural: No (11/06/20 1000)  [place]

## 2020-11-06 NOTE — Anesthesia Preprocedure Evaluation (Signed)
Anesthesia Evaluation  Patient identified by MRN, date of birth, ID band Patient awake    Reviewed: Allergy & Precautions, NPO status , Patient's Chart, lab work & pertinent test results  History of Anesthesia Complications (+) PONV  Airway Mallampati: II  TM Distance: >3 FB Neck ROM: Full    Dental no notable dental hx.    Pulmonary neg pulmonary ROS,    Pulmonary exam normal breath sounds clear to auscultation       Cardiovascular negative cardio ROS Normal cardiovascular exam Rhythm:Regular Rate:Normal     Neuro/Psych PSYCHIATRIC DISORDERS Anxiety negative neurological ROS     GI/Hepatic negative GI ROS, Neg liver ROS,   Endo/Other  negative endocrine ROS  Renal/GU negative Renal ROS  negative genitourinary   Musculoskeletal negative musculoskeletal ROS (+)   Abdominal   Peds  Hematology negative hematology ROS (+)   Anesthesia Other Findings Repeat C/S x4  Reproductive/Obstetrics (+) Pregnancy                             Anesthesia Physical Anesthesia Plan  ASA: 2  Anesthesia Plan: Spinal   Post-op Pain Management:    Induction:   PONV Risk Score and Plan: Treatment may vary due to age or medical condition  Airway Management Planned: Natural Airway  Additional Equipment:   Intra-op Plan:   Post-operative Plan:   Informed Consent: I have reviewed the patients History and Physical, chart, labs and discussed the procedure including the risks, benefits and alternatives for the proposed anesthesia with the patient or authorized representative who has indicated his/her understanding and acceptance.     Dental advisory given  Plan Discussed with: CRNA  Anesthesia Plan Comments:         Anesthesia Quick Evaluation

## 2020-11-06 NOTE — Anesthesia Procedure Notes (Signed)
Spinal  Patient location during procedure: OR Start time: 11/06/2020 7:54 AM End time: 11/06/2020 7:57 AM Reason for block: surgical anesthesia Staffing Performed: anesthesiologist  Anesthesiologist: Freddrick March, MD Preanesthetic Checklist Completed: patient identified, IV checked, risks and benefits discussed, surgical consent, monitors and equipment checked, pre-op evaluation and timeout performed Spinal Block Patient position: sitting Prep: DuraPrep and site prepped and draped Patient monitoring: cardiac monitor, continuous pulse ox and blood pressure Approach: midline Location: L3-4 Injection technique: single-shot Needle Needle type: Pencan  Needle gauge: 24 G Needle length: 9 cm Assessment Sensory level: T6 Events: CSF return Additional Notes Functioning IV was confirmed and monitors were applied. Sterile prep and drape, including hand hygiene and sterile gloves were used. The patient was positioned and the spine was prepped. The skin was anesthetized with lidocaine.  Free flow of clear CSF was obtained prior to injecting local anesthetic into the CSF.  The spinal needle aspirated freely following injection.  The needle was carefully withdrawn.  The patient tolerated the procedure well.

## 2020-11-06 NOTE — Op Note (Signed)
Cesarean Section Procedure Note  Indications: previous uterine incision kerr x3 or greater  Pre-operative Diagnosis: 39 week 2 day pregnancy.  Post-operative Diagnosis: same  Surgeon: Lovenia Kim   Assistants: Ronnald Ramp, CNM  Anesthesia: Local anesthesia 0.25.% bupivacaine and Spinal anesthesia  ASA Class: 2  Procedure Details  The patient was seen in the Holding Room. The risks, benefits, complications, treatment options, and expected outcomes were discussed with the patient.  The patient concurred with the proposed plan, giving informed consent. The risks of anesthesia, infection, bleeding and possible injury to other organs discussed. Injury to bowel, bladder, or ureter with possible need for repair discussed. Possible need for transfusion with secondary risks of hepatitis or HIV acquisition discussed. Post operative complications to include but not limited to DVT, PE and Pneumonia noted. The site of surgery properly noted/marked. The patient was taken to Operating Room # B, identified as Shelly [name] and the procedure verified as C-Section Delivery. A Time Out was held and the above information confirmed.  After induction of anesthesia, the patient was draped and prepped in the usual sterile manner. A Pfannenstiel incision was made and carried down through the subcutaneous tissue to the fascia. Fascial incision was made and extended transversely using Mayo scissors. The fascia was separated from the underlying rectus tissue superiorly and inferiorly. Direct peritoneal entry noted. Adhesions of anterior abd wall, bladder flap were lysed. The utero-vesical peritoneal reflection was incised transversely and the bladder flap was bluntly freed from the lower uterine segment. A low transverse uterine incision(Kerr hysterotomy) was made. Delivered from OT presentation with vacuum assistance x one pull was a  female with Apgar scores of 9 at one minute and 9 at five minutes. Bulb suctioning gently  performed. Neonatal team in attendance.After the umbilical cord was clamped and cut cord blood was obtained for evaluation. The placenta was removed intact and appeared normal. The uterus was curetted with a dry lap pack and exteriorized. Good hemostasis was noted.The uterine outline, tubes and ovaries appeared normal. The uterine incision was closed with running locked sutures of 0 Monocryl x 1layers. Hemostasis was observed. Modified Pomeroy bilateral partial salpingectomy with 2-0 plain suture ties without complications. The fascia was then reapproximated with running sutures of 0 Monocryl. The skin was reapproximated with 4-0 vicryl after McMullin closure with 2-0 plain.  Instrument, sponge, and needle counts were correct prior the abdominal closure and at the conclusion of the case.   Findings: FTLM, OT, anterior placenta, nl tubes, Anterior abdominal wall adhesions  Estimated Blood Loss:  300 mL         Drains: foley                 Specimens: placenta and tubal segments                 Complications:  None; patient tolerated the procedure well.         Disposition: PACU - hemodynamically stable.         Condition: stable  Attending Attestation: I performed the procedure.

## 2020-11-06 NOTE — Lactation Note (Signed)
This note was copied from a baby's chart. Lactation Consultation Note  Patient Name: Shelly [name] [id number] Date: 11/06/2020   Age:37 hours   Mom is a P5 who reports + breast changes w/pregnancy. She had a breast augmentation after the birth of her 1st child. She did not notice the surgery impacting her milk supply with the subsequent child. Mom has nursed all but her 3rd child. Her oldest child is 57 yo; her youngest child is 63 yo.  Mom's says that infant is doing well on the L breast and she hears swallows. Mom is having difficulty with soreness on her R nipple; Mom does not hear swallows when infant is feeding from the R breast. Mom's nipples are intact.   Infant had fed recently prior to my arrival into room. I changed infant's diaper & then placed him skin-to-skin on Mom's chest; he soon fell asleep. Mom was educated about initial newborn behavior.  Mom says that her milk tends to come to volume at the end of Day 2/beginning of Day 3. Mom was made aware of O/P services and our phone # for post-discharge questions.   Matthias Hughs [facility] 11/06/2020, 2:31 PM

## 2020-11-06 NOTE — Transfer of Care (Signed)
Immediate Anesthesia Transfer of Care Note  Patient: Shelly [name]  Procedure(s) Performed: Repeat CESAREAN SECTION  Patient Location: PACU  Anesthesia Type:Spinal  Level of Consciousness: awake, alert  and oriented  Airway & Oxygen Therapy: Patient Spontanous Breathing  Post-op Assessment: Report given to RN and Post -op Vital signs reviewed and stable  Post vital signs: Reviewed and stable  Last Vitals:  Vitals Value Taken Time  BP 123/55 11/06/20 0918  Temp    Pulse 75 11/06/20 0924  Resp 18 11/06/20 0924  SpO2 100 % 11/06/20 0924  Vitals shown include unvalidated device data.  Last Pain:  Vitals:   11/06/20 [id number]  TempSrc: Oral  PainSc: 0-No pain         Complications: No notable events documented.

## 2020-11-06 NOTE — Lactation Note (Signed)
This note was copied from a baby's chart. Lactation Consultation Note  Patient Name: Shelly [name] [id number] Date: 11/06/2020 Age:37 hours  LC in to room per mother's request. RN is doing mother's assessment at this time. LC will come back at a later time as possible.    [name] 11/06/2020, 10:07 PM

## 2020-11-07 DIAGNOSIS — D62 Acute posthemorrhagic anemia: Secondary | ICD-10-CM | POA: Diagnosis not present

## 2020-11-07 LAB — CBC
HCT: 26.7 % — ABNORMAL LOW (ref 36.0–46.0)
Hemoglobin: 8.5 g/dL — ABNORMAL LOW (ref 12.0–15.0)
MCH: 28.5 pg (ref 26.0–34.0)
MCHC: 31.8 g/dL (ref 30.0–36.0)
MCV: 89.6 fL (ref 80.0–100.0)
Platelets: 187 10*3/uL (ref 150–400)
RBC: 2.98 MIL/uL — ABNORMAL LOW (ref 3.87–5.11)
RDW: 19.5 % — ABNORMAL HIGH (ref 11.5–15.5)
WBC: 13.3 10*3/uL — ABNORMAL HIGH (ref 4.0–10.5)
nRBC: 0 % (ref 0.0–0.2)

## 2020-11-07 LAB — BIRTH TISSUE RECOVERY COLLECTION (PLACENTA DONATION)

## 2020-11-07 LAB — SURGICAL PATHOLOGY

## 2020-11-07 MED ORDER — SODIUM CHLORIDE 0.9 % IV SOLN
500.0000 mg | Freq: Once | INTRAVENOUS | Status: AC
  Administered 2020-11-07: 500 mg via INTRAVENOUS
  Filled 2020-11-07: qty 25

## 2020-11-07 MED ORDER — MAGNESIUM OXIDE -MG SUPPLEMENT 400 (240 MG) MG PO TABS
400.0000 mg | ORAL_TABLET | Freq: Every day | ORAL | Status: DC
  Administered 2020-11-08: 400 mg via ORAL
  Filled 2020-11-07: qty 1

## 2020-11-07 MED ORDER — POLYSACCHARIDE IRON COMPLEX 150 MG PO CAPS
150.0000 mg | ORAL_CAPSULE | Freq: Every day | ORAL | Status: DC
  Administered 2020-11-08: 150 mg via ORAL
  Filled 2020-11-07: qty 1

## 2020-11-07 NOTE — Lactation Note (Signed)
This note was copied from a baby's chart. Lactation Consultation Note  Patient Name: Shelly [name] Ruediger [id number] Date: 11/07/2020 Reason for consult: Initial assessment;Term Age:37 hours   P5 mother whose infant is now 38 hours old.  This is a term baby at 39+3 weeks.    Mother had baby latched to the right breast when I arrived.  His latch appeared shallow and he was covered with a blanket. Offered to assist with latching and mother agreeable.  Upon release from her breast, I observed 2 small blisters on the nipple tip.  Left breast has bruising from previous poor latches.  Reviewed breast feeding basics and assisted to latch deeply.  Mother denied pain with feeding.  Also encouraged mother to feed STS without a blanket cover.  Assisted to latch and he began sucking eagerly.  Mother stated her right side is the more difficult side for latching.  On her left side she can hear him swallow at the breast.  Discussed cluster feeding and suggested mother rest when possible. Asked mother to express colostrum drops and follow with coconut oil after every feeding.  Provided comfort gels for intermittent use or at night; placed in refrigerator for cooling.  Mother appreciative.   She will continue to feed on cue or at least 8-12 times/24 hours.  Encouraged to call her RN/LC for latch assistance as needed. Mother had a few pumping questions which I answered to her satisfaction.  Father present.   Maternal Data Has patient been taught Hand Expression?: Yes Does the patient have breastfeeding experience prior to this delivery?: Yes How long did the patient breastfeed?: varying lengths of time from 3-9 months with her other children  Feeding Mother's Current Feeding Choice: Breast Milk  LATCH Score Latch: Grasps breast easily, tongue down, lips flanged, rhythmical sucking.  Audible Swallowing: A few with stimulation  Type of Nipple: Everted at rest and after stimulation  Comfort (Breast/Nipple):  Filling, red/small blisters or bruises, mild/mod discomfort  Hold (Positioning): Assistance needed to correctly position infant at breast and maintain latch.  LATCH Score: 7   Lactation Tools Discussed/Used Tools: Coconut oil;Comfort gels  Interventions Interventions: Breast feeding basics reviewed;Assisted with latch;Skin to skin;Breast compression;Adjust position;Comfort gels;Coconut oil;Position options;Support pillows;Education  Discharge Pump: Personal WIC Program: No  Consult Status Consult Status: Follow-up Date: 11/08/20 Follow-up type: In-patient    Little Ishikawa 11/07/2020, 12:13 PM

## 2020-11-07 NOTE — Social Work (Signed)
MOB was referred for history of anxiety.  * Referral screened out by Clinical Social Worker because none of the following criteria appear to apply: ~ History of anxiety/depression during this pregnancy, or of post-partum depression following prior delivery. ~ Diagnosis of anxiety and/or depression within last 3 years OR * MOB's symptoms currently being treated with medication and/or therapy. Per chart review, MOB prescribed Buspirone '5mg'$  for anxiety.   Please contact the Clinical Social Worker if needs arise, by [facility] request, or if MOB scores greater than 9/yes to question 10 on Edinburgh Postpartum Depression Screen.   Kathrin Greathouse, MSW, LCSW [facility] [place] Worker  [phone] 11/07/2020  8:50 AM

## 2020-11-07 NOTE — Lactation Note (Signed)
This note was copied from a baby's chart. Lactation Consultation Note  Patient Name: Shelly [name] [id number] Date: 11/07/2020 Reason for consult: Follow-up assessment;Difficult latch;Term Age:37 hours  LC in to room per mother's request. Mother states it is difficult to latch infant to right breast. Left breast is already dore and has a compression stripe.  Upon stimulation, "Roxy Manns" is awake and showing hunger cues. Grady assisted with latch, alignment, support pillows, neck/back extension. Colostrum is easily expressed. Latched infant after a few attempts. Noted suckling and swallowing. Mother reports mild discomfort/pain with latch. Demonstrated chin tug and mother reports improvement. Educated mom about deep latch for an optimal breastfeeding session. Mother and LC noted difficulty flanging upper lip. Mother explains her daughter had a tongue and a lip tie.  Plan:   1-Breastfeeding on demand, ensuring a deep, comfortable latch.  2-Undressing infant and place skin to skin when ready to breastfeed 3-Keep infant awake during breastfeeding session: massaging breast, infant's hand/shoulder/feet 4-Monitor voids and stools as signs good intake.  5-Contact LC as needed for feeds/support/concerns/questions   Maternal Data Has patient been taught Hand Expression?: Yes  Feeding Mother's Current Feeding Choice: Breast Milk  LATCH Score Latch: Grasps breast easily, tongue down, lips flanged, rhythmical sucking.  Audible Swallowing: Spontaneous and intermittent  Type of Nipple: Everted at rest and after stimulation  Comfort (Breast/Nipple): Filling, red/small blisters or bruises, mild/mod discomfort (left nipple has compression stripe, right nipple is mildly sore.)  Hold (Positioning): Assistance needed to correctly position infant at breast and maintain latch.  LATCH Score: 8  Interventions Interventions: Breast feeding basics reviewed;Assisted with latch;Skin to skin;Breast massage;Hand  express;Adjust position;Expressed milk;Position options;Support pillows;Education  Discharge Discharge Education: Engorgement and breast care Pump: Personal WIC Program: No  Consult Status Consult Status: Follow-up Date: 11/08/20 Follow-up type: In-patient    Jahnavi Muratore A Higuera Ancidey 11/07/2020, 12:00 AM

## 2020-11-07 NOTE — Progress Notes (Addendum)
POSTOPERATIVE DAY # 1 S/P Repeat LTCS, baby boy   S:         Reports feeling well, tired, c/o mild gas pain              Tolerating po intake / no nausea / no vomiting / + flatus / no BM  Denies dizziness, SOB, or CP             Bleeding is light             Pain controlled with Tylenol, Toradol, and Oxy IR             Up ad lib / ambulatory/ voiding QS without difficulty  Newborn breast feeding - going well, baby cluster feeding / Circumcision planning   O:  VS: BP (!) 106/56 (BP Location: Right Arm)   Pulse (!) 59   Temp 98.6 F (37 C) (Oral)   Resp 16   Ht '5\' 1"'$  (1.549 m)   Wt 70 kg   SpO2 100%   Breastfeeding Unknown   BMI 29.17 kg/m  Patient Vitals for the past 24 hrs:  BP Temp Temp src Pulse Resp SpO2  11/07/20 0616 (!) 106/56 98.6 F (37 C) Oral (!) 59 16 100 %  11/06/20 2202 (!) 101/57 98.8 F (37.1 C) Oral 63 18 99 %  11/06/20 1730 -- -- -- -- -- 99 %  11/06/20 1530 117/66 98.1 F (36.7 C) Oral 78 18 98 %  11/06/20 1329 -- -- -- -- 18 97 %  11/06/20 1138 111/69 98.5 F (36.9 C) Oral 64 18 100 %  11/06/20 1031 116/64 98.2 F (36.8 C) Oral 73 16 100 %  11/06/20 1020 115/84 98.1 F (36.7 C) -- 76 20 100 %  11/06/20 1000 124/77 -- -- 66 20 100 %  11/06/20 0947 120/78 98.1 F (36.7 C) -- 68 20 100 %  11/06/20 0930 116/60 -- -- 72 19 100 %  11/06/20 0918 (!) 123/55 97.7 F (36.5 C) -- 79 (!) 24 100 %      LABS:               Recent Labs    11/04/20 1049 11/07/20 0601  WBC 7.8 13.3*  HGB 11.9* 8.5*  PLT 208 187               Bloodtype: --/--/O POS (07/25 1055)  Rubella:                                               I&O: Intake/Output      07/27 0701 07/28 0700 07/28 0701 07/29 0700   I.V. (mL/kg) 1000 (14.3)    Total Intake(mL/kg) 1000 (14.3)    Urine (mL/kg/hr) 2100 (1.3)    Blood 275    Total Output 2375    Net -1375          Tdap: UTD Covid: declines              Physical Exam:             Alert and Oriented X3  Lungs: Clear and  unlabored  Heart: regular rate and rhythm / no murmurs  Abdomen: soft, non-tender, mild gaseous distention, active bowel sounds in all quadrants             Fundus: firm, non-tender, U-1  Dressing: honeycomb with steri-strips c/d/i              Incision:  approximated with sutures / no erythema / no ecchymosis / no drainage  Perineum: intact  Lochia: scant on pad   Extremities: no edema, no calf pain or tenderness, no Homans  A/P:      POD # 1 S/P Repeat LTCS            ABL Anemia    - Recommend IV Venofer today    - Begin Niferex '150mg'$  PO daily and Magnesium oxide '400mg'$  PO daily tomorrow  Routine postoperative care              Lactation support PRN  Ambulation, warm liquids encouraged to promote gut motility  Continue current care  Circ prior to d/c   Anticipate early d/c home tomorrow     Lars Pinks, MSN, CNM [facility]

## 2020-11-08 LAB — BPAM RBC
Blood Product Expiration Date: 202208272359
Blood Product Expiration Date: 202208272359
Unit Type and Rh: 5100
Unit Type and Rh: 5100

## 2020-11-08 LAB — TYPE AND SCREEN
ABO/RH(D): O POS
Antibody Screen: NEGATIVE
Unit division: 0
Unit division: 0

## 2020-11-08 MED ORDER — IBUPROFEN 600 MG PO TABS: 600.0000 mg | ORAL_TABLET | Freq: Four times a day (QID) | ORAL | 0 refills | Status: DC

## 2020-11-08 MED ORDER — OXYCODONE HCL 5 MG PO TABS: 5.0000 mg | ORAL_TABLET | ORAL | 0 refills | Status: DC | PRN

## 2020-11-08 MED ORDER — POLYSACCHARIDE IRON COMPLEX 150 MG PO CAPS: 150.0000 mg | ORAL_CAPSULE | Freq: Every day | ORAL | 1 refills | Status: DC

## 2020-11-08 MED ORDER — MAGNESIUM OXIDE -MG SUPPLEMENT 400 (240 MG) MG PO TABS: 400.0000 mg | ORAL_TABLET | Freq: Every day | ORAL | 1 refills | Status: DC

## 2020-11-08 NOTE — Discharge Summary (Signed)
OB Discharge Summary  Patient Name: Shelly [name] DOB: 1986/03/25 MRN: [id number]  Date of admission: 11/06/2020 Delivering provider: Brien Few   Admitting diagnosis: Previous cesarean section [Z98.891] Previous cesarean delivery affecting pregnancy [O34.219] Intrauterine pregnancy: [redacted]w[redacted]d    Secondary diagnosis: Patient Active Problem List   Diagnosis Date Noted   Acute blood loss anemia (ABLA) 11/07/2020   Previous cesarean section 11/06/2020   Status post repeat low transverse cesarean section 7/27 11/06/2020   Postpartum care following cesarean delivery 7/27 11/06/2020   Anxiety 11/06/2020   History of anemia 11/06/2020   Previous cesarean delivery affecting pregnancy 11/06/2020    Date of discharge: 11/08/2020   Discharge diagnosis: Principal Problem:   Postpartum care following cesarean delivery 7/27 Active Problems:   Previous cesarean section   Status post repeat low transverse cesarean section 7/27   Anxiety   History of anemia   Previous cesarean delivery affecting pregnancy   Acute blood loss anemia (ABLA)                                                          Post partum procedures: None  Augmentation: N/A Pain control: Spinal  Laceration:None  Episiotomy:None  Complications: None  Hospital course:  Scheduled C/S   35y.o. yo G5P5005 at 360w3das admitted to the hospital 11/06/2020 for scheduled cesarean section with the following indication:Elective Repeat.Delivery details are as follows:  Membrane Rupture Time/Date: 8:27 AM ,11/06/2020   Delivery Method:C-Section, Vacuum Assisted  Details of operation can be found in separate operative note.  Patient had an uncomplicated postpartum course.  [name], tolerating a regular diet, passing flatus, and urinating well. Patient is discharged home in stable condition on  11/08/20        Newborn Data: Birth date:11/06/2020  Birth time:8:27 AM  Gender:Female  Living status:Living  Apgars:9 ,10   Weight:4030 g     Physical exam  Vitals:   11/07/20 0616 11/07/20 1429 11/07/20 2100 11/08/20 0500  BP: (!) 106/56 108/60 132/73 128/70  Pulse: (!) 59 (!) 59 73 66  Resp: '16 18 16 16  '$ Temp: 98.6 F (37 C) 98.6 F (37 C) 98.6 F (37 C) 98.2 F (36.8 C)  TempSrc: Oral Oral Oral Oral  SpO2: 100%     Weight:      Height:       General: alert, cooperative, and no distress Lochia: appropriate Uterine Fundus: firm Incision: Dressing is clean, dry, and intact DVT Evaluation: No evidence of DVT seen on physical exam. No significant calf/ankle edema. Labs: Lab Results  Component Value Date   WBC 13.3 (H) 11/07/2020   HGB 8.5 (L) 11/07/2020   HCT 26.7 (L) 11/07/2020   MCV 89.6 11/07/2020   PLT 187 11/07/2020   CMP Latest Ref Rng & Units 10/02/2019  Glucose 70 - 99 mg/dL 139(H)  BUN 6 - 20 mg/dL 15  Creatinine 0.44 - 1.00 mg/dL 0.84  Sodium 135 - 145 mmol/L 139  Potassium 3.5 - 5.1 mmol/L 3.4(L)  Chloride 98 - 111 mmol/L 108  CO2 22 - 32 mmol/L 23  Calcium 8.9 - 10.3 mg/dL 8.5(L)  Total Protein 6.5 - 8.1 g/dL 7.4  Total Bilirubin 0.3 - 1.2 mg/dL 0.4  Alkaline Phos 38 - 126 U/L 54  AST 15 - 41 U/L 14(L)  ALT 0 - 44 U/L 11   Edinburgh Postnatal Depression Scale Screening Tool 11/06/2020 11/06/2020 11/30/2017  I have been able to laugh and see the funny side of things. 0 (No Data) 0  I have looked forward with enjoyment to things. 0 - 0  I have blamed myself unnecessarily when things went wrong. 0 - 1  I have been anxious or worried for no good reason. 2 - 1  I have felt scared or panicky for no good reason. 0 - 1  Things have been getting on top of me. 1 - 1  I have been so unhappy that I have had difficulty sleeping. 0 - 0  I have felt sad or miserable. 0 - 0  I have been so unhappy that I have been crying. 0 - 0  The thought of harming myself has occurred to me. 0 - 0  Edinburgh Postnatal Depression Scale Total 3 - 4   Vaccines: TDaP UTD         COVID-19    Declines  Discharge instructions:  per After Visit Summary   After Visit Meds:  Allergies as of 11/08/2020   No Known Allergies      Medication List     STOP taking these medications    albuterol 108 (90 Base) MCG/ACT inhaler Commonly known as: VENTOLIN HFA   cephALEXin 500 MG capsule Commonly known as: KEFLEX   famotidine 20 MG tablet Commonly known as: PEPCID   fluticasone 50 MCG/ACT nasal spray Commonly known as: FLONASE   naproxen 500 MG tablet Commonly known as: NAPROSYN   nitrofurantoin (macrocrystal-monohydrate) 100 MG capsule Commonly known as: Macrobid   Slow Release Iron 45 MG Tbcr Generic drug: Ferrous Sulfate Dried   SUMAtriptan 50 MG tablet Commonly known as: IMITREX   traMADol 50 MG tablet Commonly known as: ULTRAM       TAKE these medications    ibuprofen 600 MG tablet Commonly known as: ADVIL Take 1 tablet (600 mg total) by mouth every 6 (six) hours.   iron polysaccharides 150 MG capsule Commonly known as: Ferrex 150 Take 1 capsule (150 mg total) by mouth daily.   magnesium oxide 400 (240 Mg) MG tablet Commonly known as: MAG-OX Take 1 tablet (400 mg total) by mouth daily. Start taking on: November 09, 2020   oxyCODONE 5 MG immediate release tablet Commonly known as: Oxy IR/ROXICODONE Take 1 tablet (5 mg total) by mouth every 4 (four) hours as needed for moderate pain or severe pain.       Diet: routine diet  Activity: Advance as tolerated. Pelvic rest for 6 weeks.   Newborn Data: Live born female  Birth Weight: 8 lb 14.2 oz (4030 g) APGAR: 9, 10  Newborn Delivery   Birth date/time: 11/06/2020 08:27:00 Delivery type: C-Section, Vacuum Assisted Trial of labor: No C-section categorization: Repeat    Named Roxy Manns Baby Feeding: Breast Disposition:home with mother  Delivery Report:  Review the Delivery Report for details.    Follow up:  Follow-up Information     Brien Few, MD. Schedule an appointment as soon as  possible for a visit in 6 week(s).   Specialty: Obstetrics and Gynecology Contact information: [place] [address] [place], CNM, MSN 11/08/2020, 11:24 AM

## 2020-11-08 NOTE — Lactation Note (Signed)
This note was copied from a baby's chart. Lactation Consultation Note  Patient Name: Boy Teckla Pheng [id number] Date: 11/08/2020 Reason for consult: Follow-up assessment;Nipple pain/trauma;Term Age:37 hours  Mom's milk is beginning to come to volume. Mom was assisted with latch (lowering jaw to increase gape/flange lip). Mom felt that made latch more comfortable. Infant with frequent swallows audible to the naked ear. Mom cites intense discomfort with initial latch. Mom has not been using an asymmetric latch; specifics of an asymmetric latch were shown via [employer].   "Roxy Manns" ended feeding on his own with L nipple only slightly creased. L breast also softer than R. Mom reports that infant's stools are turning a lighter green/yellow.  Mom's R nipple abraded in multiple places on nipple surface. Mom has Comfort Gels; instructions for use discussed. I anticipate that with changing latch technique & using Comfort Gels that comfort will improve. However, Mom mentioned pumping & BO to give herself a break. I provided her with the hand-out "Bottle Feeding Your Baby" so she would have volume parameters for feeding.  Parents know how to reach [place] for any post-discharge questions.   Matthias Hughs [facility] 11/08/2020, 9:00 AM

## 2020-11-10 ENCOUNTER — Inpatient Hospital Stay (HOSPITAL_COMMUNITY): Admit: 2020-11-10 | Payer: Self-pay

## 2020-11-20 ENCOUNTER — Telehealth (HOSPITAL_COMMUNITY): Payer: Self-pay

## 2020-11-20 NOTE — Telephone Encounter (Signed)
No answer. Left message to return nurse call.  Sharyn Lull [facility] 11/20/2020,1949

## 2022-01-04 ENCOUNTER — Encounter (HOSPITAL_COMMUNITY): Payer: Self-pay

## 2022-01-04 ENCOUNTER — Emergency Department (HOSPITAL_COMMUNITY): Payer: BLUE CROSS/BLUE SHIELD

## 2022-01-04 ENCOUNTER — Emergency Department (HOSPITAL_COMMUNITY)
Admission: EM | Admit: 2022-01-04 | Discharge: 2022-01-04 | Disposition: A | Discharge status: Home / Self Care | Payer: BLUE CROSS/BLUE SHIELD | Attending: Emergency Medicine | Admitting: Emergency Medicine

## 2022-01-04 DIAGNOSIS — M62830 Muscle spasm of back: Secondary | ICD-10-CM | POA: Insufficient documentation

## 2022-01-04 DIAGNOSIS — M545 Low back pain, unspecified: Secondary | ICD-10-CM | POA: Diagnosis present

## 2022-01-04 LAB — URINALYSIS, ROUTINE W REFLEX MICROSCOPIC
Bilirubin Urine: NEGATIVE
Glucose, UA: NEGATIVE mg/dL
Ketones, ur: NEGATIVE mg/dL
Leukocytes,Ua: NEGATIVE
Nitrite: NEGATIVE
Protein, ur: NEGATIVE mg/dL
Specific Gravity, Urine: 1.01 (ref 1.005–1.030)
pH: 6 (ref 5.0–8.0)

## 2022-01-04 MED ORDER — LIDOCAINE 5 % EX PTCH
1.0000 | MEDICATED_PATCH | CUTANEOUS | Status: DC
  Administered 2022-01-04: 1 via TRANSDERMAL
  Filled 2022-01-04: qty 1

## 2022-01-04 MED ORDER — KETOROLAC TROMETHAMINE 15 MG/ML IJ SOLN
15.0000 mg | Freq: Once | INTRAMUSCULAR | Status: AC
  Administered 2022-01-04: 15 mg via INTRAMUSCULAR
  Filled 2022-01-04: qty 1

## 2022-01-04 MED ORDER — METHOCARBAMOL 500 MG PO TABS: 500.0000 mg | ORAL_TABLET | Freq: Two times a day (BID) | ORAL | 0 refills | Status: DC

## 2022-01-04 MED ORDER — LIDOCAINE 5 % EX PTCH: 1.0000 | MEDICATED_PATCH | CUTANEOUS | 0 refills | Status: DC

## 2022-01-04 MED ORDER — METHOCARBAMOL 500 MG PO TABS
500.0000 mg | ORAL_TABLET | Freq: Once | ORAL | Status: AC
  Administered 2022-01-04: 500 mg via ORAL
  Filled 2022-01-04: qty 1

## 2022-01-04 MED ORDER — IBUPROFEN 600 MG PO TABS: 600.0000 mg | ORAL_TABLET | Freq: Four times a day (QID) | ORAL | 0 refills | Status: DC | PRN

## 2022-01-04 NOTE — ED Triage Notes (Signed)
Pt arrived via POV, c/o back spasms. States worsening with deep breathing and mvmt. Started last night, states she tried lying on heating pad with no relief.

## 2022-01-04 NOTE — ED Provider Notes (Addendum)
[facility] DEPT Provider Note   CSN: [id number] Arrival date & time: 01/04/22  1012     History  Chief Complaint  Patient presents with   Back Pain    Shelly [name] is a 36 y.o. female.   Back Pain   36 year old female presents emergency department with complaints of back pain.  Patient states that symptoms woke her up last night.  She reported try to use a heating pad which minimally helped her symptoms.  She reports pain is worsened with movement.  Denies association with consumption of food or liquids.  Patient has a surgical procedure this coming Friday of hernia repair so she was advised to stay away from NSAIDs.  Denies fever, saddle anesthesia, weakness/sensory deficits in lower extremities, known cancer diagnosis, chronic steroid use, bowel/bladder dysfunction.  Denies chest pain, shortness of breath, abdominal pain, nausea, vomiting, urinary/vaginal symptoms, change in bowel habits.  Patient reports last menstrual period was August 25th but has not had sexual intercourse since then so is not concerned about pregnancy; states periods are irregular nature.  Past medical history significant for endometriosis, anemia, anxiety  Home Medications Prior to Admission medications   Medication Sig Start Date End Date Taking? Authorizing Provider  lidocaine (LIDODERM) 5 % Place 1 patch onto the skin daily. Remove & Discard patch within 12 hours or as directed by MD 01/04/22  Yes Dion Saucier A, PA  methocarbamol (ROBAXIN) 500 MG tablet Take 1 tablet (500 mg total) by mouth 2 (two) times daily. 01/04/22  Yes Dion Saucier A, PA  iron polysaccharides (FERREX 150) 150 MG capsule Take 1 capsule (150 mg total) by mouth daily. 11/08/20   Suzan Nailer, CNM  magnesium oxide (MAG-OX) 400 (240 Mg) MG tablet Take 1 tablet (400 mg total) by mouth daily. 11/09/20   Suzan Nailer, CNM  oxyCODONE (OXY IR/ROXICODONE) 5 MG immediate release tablet Take 1 tablet (5 mg  total) by mouth every 4 (four) hours as needed for moderate pain or severe pain. 11/08/20   Suzan Nailer, CNM  ipratropium (ATROVENT) 0.06 % nasal spray Place 2 sprays into both nostrils 4 (four) times daily. Patient not taking: Reported on 10/03/2019 01/15/19 10/03/19  Ok Edwards, PA-C  levalbuterol [facility] HFA) 45 MCG/ACT inhaler Inhale 1-2 puffs into the lungs every 4 (four) hours as needed for wheezing or shortness of breath. Patient not taking: Reported on 10/03/2019 01/15/19 10/03/19  Ok Edwards, PA-C      Allergies    Patient has no known allergies.    Review of Systems   Review of Systems  Musculoskeletal:  Positive for back pain.  All other systems reviewed and are negative.   Physical Exam Updated Vital Signs BP 120/83 (BP Location: Right Arm)   Pulse 99   Temp 98.2 F (36.8 C) (Oral)   Resp 18   LMP 12/05/2021 (Approximate)   SpO2 100%  Physical Exam Vitals and nursing note reviewed.  Constitutional:      General: She is not in acute distress.    Appearance: She is well-developed.  HENT:     Head: Normocephalic and atraumatic.  Eyes:     Conjunctiva/sclera: Conjunctivae normal.  Cardiovascular:     Rate and Rhythm: Normal rate and regular rhythm.     Heart sounds: No murmur heard. Pulmonary:     Effort: Pulmonary effort is normal. No respiratory distress.     Breath sounds: Normal breath sounds. No wheezing, rhonchi or rales.  Abdominal:     Palpations: Abdomen is soft.     Tenderness: There is no abdominal tenderness. There is no right CVA tenderness, left CVA tenderness or guarding.  Musculoskeletal:        General: No swelling.     Cervical back: Normal range of motion and neck supple. No rigidity or tenderness.     Comments: No midline tenderness of cervical, thoracic, lumbar spine with no obvious step-off or deformity.  Patient has paraspinal tenderness to palpation in the lower thoracic region on the right side.  Pain does not radiate.  No overlying skin  abnormalities noted.  Pain is worsened with extension, flexion and horizontal rotation of the back.  No CVA tenderness bilaterally.  Muscle strength 5 out of 5 for lower extremities.  No sensory deficits along major nerve distributions of lower extremities.  DTR symmetric and equal bilaterally.  Posterior tibial pulses full and equal bilaterally.  Skin:    General: Skin is warm and dry.     Capillary Refill: Capillary refill takes less than 2 seconds.  Neurological:     Mental Status: She is alert.  Psychiatric:        Mood and Affect: Mood normal.     ED Results / Procedures / Treatments   Labs (all labs ordered are listed, but only abnormal results are displayed) Labs Reviewed  URINALYSIS, ROUTINE W REFLEX MICROSCOPIC - Abnormal; Notable for the following components:      Result Value   Hgb urine dipstick SMALL (*)    Bacteria, UA RARE (*)    All other components within normal limits    EKG None  Radiology DG Chest 2 View  Result Date: 01/04/2022 CLINICAL DATA:  Pain. EXAM: CHEST - 2 VIEW COMPARISON:  None Available. FINDINGS: The heart size and mediastinal contours are within normal limits. Both lungs are clear. The visualized skeletal structures are unremarkable. IMPRESSION: No active cardiopulmonary disease. Electronically Signed   By: Dorise Bullion III M.D.   On: 01/04/2022 11:27    Procedures Procedures    Medications Ordered in ED Medications  lidocaine (LIDODERM) 5 % 1 patch (1 patch Transdermal Patch Applied 01/04/22 1109)  ketorolac (TORADOL) 15 MG/ML injection 15 mg (15 mg Intramuscular Given 01/04/22 1109)  methocarbamol (ROBAXIN) tablet 500 mg (500 mg Oral Given by Other 01/04/22 1108)    ED Course/ Medical Decision Making/ A&P Clinical Course as of 01/04/22 1316  Sun Jan 04, 2022  1156 Reevaluation of the patient so significant improvement with medicines administered.  Awaiting UA for plus or minus hematuria given known right-sided renal stone on prior CTs.   Low suspicion for renal stone given area of tenderness. [CR]    Clinical Course User Index [CR] Wilnette Kales, PA                           Medical Decision Making Amount and/or Complexity of Data Reviewed Labs: ordered. Radiology: ordered.  Risk Prescription drug management.   This patient presents to the ED for concern of back pain, this involves an extensive number of treatment options, and is a complaint that carries with it a high risk of complications and morbidity.  The differential diagnosis includes cauda equina, transverse myelitis, AAA, aortic dissection, PUD, pancreatitis, cholecystitis, pneumothorax, pulmonary embolism,   Co morbidities that complicate the patient evaluation  See HPI   Additional history obtained:  Additional history obtained from EMR  Lab Tests:  I Ordered,  and personally interpreted labs.  The pertinent results include: Small hemoglobin, rare bacteria, otherwise unremarkable UA insignificant for urinary tract infection or hematuria   Imaging Studies ordered:  I ordered imaging studies including chest x-ray I independently visualized and interpreted imaging which showed no acute cardiopulmonary abnormalities I agree with the radiologist interpretation   Cardiac Monitoring: / EKG:  The patient was maintained on a cardiac monitor.  I personally viewed and interpreted the cardiac monitored which showed an underlying rhythm of: Sinus rhythm   Consultations Obtained:  N/a   Problem List / ED Course / Critical interventions / Medication management  Muscular spasm I ordered medication including Toradol, naproxen and Lidoderm for pain   Reevaluation of the patient after these medicines showed that the patient near resolution of symptoms I have reviewed the patients home medicines and have made adjustments as needed   Social Determinants of Health:  Denies tobacco, illicit drug use   Test / Admission - Considered:  Muscular  spasm Vitals signs within normal range and stable throughout visit. Laboratory/imaging studies significant for: See above Patient symptoms likely secondary to thoracic muscular spasm.  Doubt intrathoracic or intra-abdominal pathology given history of present illness as well as reassuring physical exam.  Doubt acute spinal process.  Patient responded with near resolution of symptoms in emergency department with therapy provided.  We will continue outpatient therapy with Tylenol, Lidoderm patch and muscle laxer to use as needed.  Patient recommended ice/heat as well as physical therapy/massage therapy as needed.  Close follow-up with PCP recommended in 2 to 3 days for reevaluation.  Treatment plan was discussed along with patient she would not dressing was agreeable to said plan. Worrisome signs and symptoms were discussed with the patient, and the patient acknowledged understanding to return to the ED if noticed. Patient was stable upon discharge.         Final Clinical Impression(s) / ED Diagnoses Final diagnoses:  Muscle spasm of back    Rx / DC Orders ED Discharge Orders          Ordered    ibuprofen (ADVIL) 600 MG tablet  Every 6 hours PRN,   Status:  Discontinued        01/04/22 1213    lidocaine (LIDODERM) 5 %  Every 24 hours        01/04/22 1213    methocarbamol (ROBAXIN) 500 MG tablet  2 times daily        01/04/22 1213              Wilnette Kales, [place] 01/04/22 1307    Wilnette Kales, PA 01/04/22 1316    Elgie Congo, MD 01/04/22 1536

## 2022-01-04 NOTE — Discharge Instructions (Signed)
Your pain is likely secondary to muscular origin as we discussed.  Your urine was clear of red blood cells.  I was doubtful of kidney stone but wanted to check your urine just to make sure as we discussed.  I have sent medicines into your local pharmacy.  I recommend close follow-up with PCP in 2 to 3 days for reevaluation of your symptoms.  Please do not hesitate to return to the emergency department for worrisome signs and symptoms we discussed become apparent.

## 2022-01-15 ENCOUNTER — Other Ambulatory Visit: Payer: Self-pay

## 2022-01-15 ENCOUNTER — Encounter (HOSPITAL_BASED_OUTPATIENT_CLINIC_OR_DEPARTMENT_OTHER): Payer: Self-pay | Admitting: Emergency Medicine

## 2022-01-15 ENCOUNTER — Emergency Department (HOSPITAL_BASED_OUTPATIENT_CLINIC_OR_DEPARTMENT_OTHER)
Admission: EM | Admit: 2022-01-15 | Discharge: 2022-01-15 | Disposition: A | Discharge status: Home / Self Care | Payer: BLUE CROSS/BLUE SHIELD | Attending: Emergency Medicine | Admitting: Emergency Medicine

## 2022-01-15 ENCOUNTER — Emergency Department (HOSPITAL_BASED_OUTPATIENT_CLINIC_OR_DEPARTMENT_OTHER): Payer: BLUE CROSS/BLUE SHIELD

## 2022-01-15 DIAGNOSIS — R109 Unspecified abdominal pain: Secondary | ICD-10-CM | POA: Insufficient documentation

## 2022-01-15 DIAGNOSIS — K5900 Constipation, unspecified: Secondary | ICD-10-CM | POA: Diagnosis present

## 2022-01-15 DIAGNOSIS — N189 Chronic kidney disease, unspecified: Secondary | ICD-10-CM | POA: Insufficient documentation

## 2022-01-15 LAB — CBC WITH DIFFERENTIAL/PLATELET
Abs Immature Granulocytes: 0.06 10*3/uL (ref 0.00–0.07)
Basophils Absolute: 0.1 10*3/uL (ref 0.0–0.1)
Basophils Relative: 1 %
Eosinophils Absolute: 0.1 10*3/uL (ref 0.0–0.5)
Eosinophils Relative: 1 %
HCT: 35.4 % — ABNORMAL LOW (ref 36.0–46.0)
Hemoglobin: 11.6 g/dL — ABNORMAL LOW (ref 12.0–15.0)
Immature Granulocytes: 0 %
Lymphocytes Relative: 6 %
Lymphs Abs: 1 10*3/uL (ref 0.7–4.0)
MCH: 27.2 pg (ref 26.0–34.0)
MCHC: 32.8 g/dL (ref 30.0–36.0)
MCV: 82.9 fL (ref 80.0–100.0)
Monocytes Absolute: 0.7 10*3/uL (ref 0.1–1.0)
Monocytes Relative: 4 %
Neutro Abs: 14.8 10*3/uL — ABNORMAL HIGH (ref 1.7–7.7)
Neutrophils Relative %: 88 %
Platelets: 520 10*3/uL — ABNORMAL HIGH (ref 150–400)
RBC: 4.27 MIL/uL (ref 3.87–5.11)
RDW: 12.9 % (ref 11.5–15.5)
WBC: 16.7 10*3/uL — ABNORMAL HIGH (ref 4.0–10.5)
nRBC: 0 % (ref 0.0–0.2)

## 2022-01-15 LAB — URINALYSIS, ROUTINE W REFLEX MICROSCOPIC
Bilirubin Urine: NEGATIVE
Glucose, UA: NEGATIVE mg/dL
Ketones, ur: NEGATIVE mg/dL
Leukocytes,Ua: NEGATIVE
Nitrite: NEGATIVE
Protein, ur: NEGATIVE mg/dL
Specific Gravity, Urine: 1.012 (ref 1.005–1.030)
pH: 5.5 (ref 5.0–8.0)

## 2022-01-15 LAB — PREGNANCY, URINE: Preg Test, Ur: NEGATIVE

## 2022-01-15 LAB — COMPREHENSIVE METABOLIC PANEL WITH GFR
ALT: 13 U/L (ref 0–44)
AST: 14 U/L — ABNORMAL LOW (ref 15–41)
Albumin: 4.4 g/dL (ref 3.5–5.0)
Alkaline Phosphatase: 58 U/L (ref 38–126)
Anion gap: 13 (ref 5–15)
BUN: 18 mg/dL (ref 6–20)
CO2: 25 mmol/L (ref 22–32)
Calcium: 8.9 mg/dL (ref 8.9–10.3)
Chloride: 101 mmol/L (ref 98–111)
Creatinine, Ser: 0.64 mg/dL (ref 0.44–1.00)
GFR, Estimated: >60 mL/min
Glucose, Bld: 92 mg/dL (ref 70–99)
Potassium: 3.8 mmol/L (ref 3.5–5.1)
Sodium: 139 mmol/L (ref 135–145)
Total Bilirubin: 0.4 mg/dL (ref 0.3–1.2)
Total Protein: 7.8 g/dL (ref 6.5–8.1)

## 2022-01-15 MED ORDER — SODIUM CHLORIDE 0.9 % IV BOLUS
1000.0000 mL | Freq: Once | INTRAVENOUS | Status: AC
  Administered 2022-01-15: 1000 mL via INTRAVENOUS

## 2022-01-15 MED ORDER — IOHEXOL 300 MG/ML  SOLN
100.0000 mL | Freq: Once | INTRAMUSCULAR | Status: AC | PRN
  Administered 2022-01-15: 75 mL via INTRAVENOUS

## 2022-01-15 MED ORDER — SORBITOL 70 % SOLN: 960.0000 mL | TOPICAL_OIL | Freq: Once | ORAL | Status: DC

## 2022-01-15 MED ORDER — FLEET ENEMA 7-19 GM/118ML RE ENEM
1.0000 | ENEMA | Freq: Once | RECTAL | Status: AC
  Administered 2022-01-15: 1 via RECTAL
  Filled 2022-01-15: qty 1

## 2022-01-15 MED ORDER — KETOROLAC TROMETHAMINE 30 MG/ML IJ SOLN
30.0000 mg | Freq: Once | INTRAMUSCULAR | Status: AC
  Administered 2022-01-15: 30 mg via INTRAVENOUS

## 2022-01-15 MED ORDER — ONDANSETRON HCL 4 MG/2ML IJ SOLN
4.0000 mg | Freq: Once | INTRAMUSCULAR | Status: AC
  Administered 2022-01-15: 4 mg via INTRAVENOUS
  Filled 2022-01-15: qty 2

## 2022-01-15 NOTE — ED Provider Notes (Signed)
[facility] EMERGENCY DEPT Provider Note  CSN: [id number] Arrival date & time: 01/15/22 1345  Chief Complaint(s) Constipation and Rectal Pain  HPI Shelly [name] is a 36 y.o. female with recent history of hernia and diastases repair with mesh placement presenting with abdominal pain.  Patient reports abdominal pain since the surgery, worse yesterday.  She has not had a bowel movement since surgery.  She has been able to eat a little bit.  She is passing a very small amount of gas.  She reports nausea, no vomiting.  No fevers or chills.  She tried to give yourself an enema yesterday which made her symptoms worse.  She had her surgery performed at [facility].  Surgery was around 6 days ago.    Past Medical History Past Medical History:  Diagnosis Date   Anemia    Anxiety    Chronic kidney disease    Endometriosis of pelvis 04/21/2012   Diagnosed on pathology after operative laparoscopy on 04/21/2012    PONV (postoperative nausea and vomiting)    Patient Active Problem List   Diagnosis Date Noted   Acute blood loss anemia (ABLA) 11/07/2020   Previous cesarean section 11/06/2020   Status post repeat low transverse cesarean section 7/27 11/06/2020   Postpartum care following cesarean delivery 7/27 11/06/2020   Anxiety 11/06/2020   History of anemia 11/06/2020   Previous cesarean delivery affecting pregnancy 11/06/2020   Home Medication(s) Prior to Admission medications   Medication Sig Start Date End Date Taking? Authorizing Provider  iron polysaccharides (FERREX 150) 150 MG capsule Take 1 capsule (150 mg total) by mouth daily. 11/08/20   Suzan Nailer, CNM  lidocaine (LIDODERM) 5 % Place 1 patch onto the skin daily. Remove & Discard patch within 12 hours or as directed by MD 01/04/22   Dion Saucier A, PA  magnesium oxide (MAG-OX) 400 (240 Mg) MG tablet Take 1 tablet (400 mg total) by mouth daily. 11/09/20   Suzan Nailer, CNM  methocarbamol (ROBAXIN) 500 MG  tablet Take 1 tablet (500 mg total) by mouth 2 (two) times daily. 01/04/22   Dion Saucier A, PA  oxyCODONE (OXY IR/ROXICODONE) 5 MG immediate release tablet Take 1 tablet (5 mg total) by mouth every 4 (four) hours as needed for moderate pain or severe pain. 11/08/20   Suzan Nailer, CNM  ipratropium (ATROVENT) 0.06 % nasal spray Place 2 sprays into both nostrils 4 (four) times daily. Patient not taking: Reported on 10/03/2019 01/15/19 10/03/19  Ok Edwards, PA-C  levalbuterol [facility] HFA) 45 MCG/ACT inhaler Inhale 1-2 puffs into the lungs every 4 (four) hours as needed for wheezing or shortness of breath. Patient not taking: Reported on 10/03/2019 01/15/19 10/03/19  Ok Edwards, PA-C  Past Surgical History Past Surgical History:  Procedure Laterality Date   BREAST ENHANCEMENT SURGERY     BREAST SURGERY     CESAREAN SECTION  09/17/00, 08/31/02, 06/14/08   x 3   CESAREAN SECTION N/A 11/29/2017   Procedure: Repeat CESAREAN SECTION;  Surgeon: Brien Few, MD;  Location: [facility];  Service: Obstetrics;  Laterality: N/A;  EDD: 12/05/17   CESAREAN SECTION N/A 11/06/2020   Procedure: Repeat CESAREAN SECTION;  Surgeon: Brien Few, MD;  Location: [facility];  Service: Obstetrics;  Laterality: N/A;  EDD: 11/10/20   CHROMOPERTUBATION  04/21/2012   Procedure: CHROMOPERTUBATION;  Surgeon: Osborne Oman, MD;  Location: [facility];  Service: Gynecology;  Laterality: N/A;   LAPAROSCOPIC LYSIS OF ADHESIONS  04/21/2012   Procedure: LAPAROSCOPIC LYSIS OF ADHESIONS;  Surgeon: Osborne Oman, MD;  Location: [facility];  Service: Gynecology;  Laterality: N/A;   LAPAROSCOPY  04/21/2012   Procedure: LAPAROSCOPY OPERATIVE;  Surgeon: Osborne Oman, MD;  Location: [facility];  Service: Gynecology;  Laterality: N/A;  Peritoneal biopsies   Family History Family History  Problem Relation Age  of Onset   Breast cancer Mother    Heart attack Father    Breast cancer Paternal Aunt    Colon cancer Other        greatgrandfather and great uncle    Social History Social History   Tobacco Use   Smoking status: Never   Smokeless tobacco: Never  Vaping Use   Vaping Use: Never used  Substance Use Topics   Alcohol use: No   Drug use: No   Allergies Patient has no known allergies.  Review of Systems Review of Systems  Physical Exam Vital Signs  I have reviewed the triage vital signs BP 108/68   Pulse 98   Temp 97.6 F (36.4 C) (Temporal)   Resp 18   Ht '5\' 1"'$  (1.549 m)   Wt 56.7 kg   LMP 12/05/2021 (Approximate)   SpO2 100%   BMI 23.62 kg/m  Physical Exam Vitals and nursing note reviewed.  Constitutional:      General: She is not in acute distress.    Appearance: She is well-developed.  HENT:     Head: Normocephalic and atraumatic.     Mouth/Throat:     Mouth: Mucous membranes are moist.  Eyes:     Pupils: Pupils are equal, round, and reactive to light.  Cardiovascular:     Rate and Rhythm: Normal rate and regular rhythm.     Heart sounds: No murmur heard. Pulmonary:     Effort: Pulmonary effort is normal. No respiratory distress.     Breath sounds: Normal breath sounds.  Abdominal:     General: Abdomen is flat.     Palpations: Abdomen is soft.     Tenderness: There is abdominal tenderness (worse in RLQ/LLQ).  Musculoskeletal:        General: No tenderness.     Right lower leg: No edema.     Left lower leg: No edema.  Skin:    General: Skin is warm and dry.  Neurological:     General: No focal deficit present.     Mental Status: She is alert. Mental status is at baseline.  Psychiatric:        Mood and Affect: Mood normal.        Behavior: Behavior normal.     ED Results and Treatments Labs (all labs ordered are listed, but only abnormal results are displayed) Labs  Reviewed  COMPREHENSIVE METABOLIC PANEL - Abnormal; Notable for the following  components:      Result Value   AST 14 (*)    All other components within normal limits  CBC WITH DIFFERENTIAL/PLATELET - Abnormal; Notable for the following components:   WBC 16.7 (*)    Hemoglobin 11.6 (*)    HCT 35.4 (*)    Platelets 520 (*)    Neutro Abs 14.8 (*)    All other components within normal limits  URINALYSIS, ROUTINE W REFLEX MICROSCOPIC - Abnormal; Notable for the following components:   Hgb urine dipstick MODERATE (*)    All other components within normal limits  PREGNANCY, URINE                                                                                                                          Radiology CT Abdomen Pelvis W Contrast  Result Date: 01/15/2022 CLINICAL DATA:  Status post recent surgery on September 29th for hernia rectus diastasis repair with subsequent rectal pain and constipation. EXAM: CT ABDOMEN AND PELVIS WITH CONTRAST TECHNIQUE: Multidetector CT imaging of the abdomen and pelvis was performed using the standard protocol following bolus administration of intravenous contrast. RADIATION DOSE REDUCTION: This exam was performed according to the departmental dose-optimization program which includes automated exposure control, adjustment of the mA and/or kV according to patient size and/or use of iterative reconstruction technique. CONTRAST:  43m OMNIPAQUE IOHEXOL 300 MG/ML  SOLN COMPARISON:  December 19, 2021 FINDINGS: Lower chest: No acute abnormality. Hepatobiliary: A stable 2.4 cm x 1.9 cm well-defined focus of parenchymal low attenuation is seen within the posterior aspect of the right lobe of the liver. No gallstones, gallbladder wall thickening, or biliary dilatation. Pancreas: Unremarkable. No pancreatic ductal dilatation or surrounding inflammatory changes. Spleen: Normal in size without focal abnormality. Adrenals/Urinary Tract: Adrenal glands are unremarkable. Kidneys are normal, without obstructing renal calculi, focal lesion, or hydronephrosis. 2  mm nonobstructing renal calculi are seen within the right kidney. Bladder is unremarkable. Stomach/Bowel: Stomach is within normal limits. The appendix is not clearly identified. A large amount of retained stool is seen within the distal sigmoid colon and rectum. No evidence of bowel wall thickening, distention, or inflammatory changes. Vascular/Lymphatic: No significant vascular findings are present. No enlarged abdominal or pelvic lymph nodes. Reproductive: Uterus and bilateral adnexa are unremarkable. Other: A mild amount of intermuscular air and subcutaneous air is seen along the anterior abdominal wall. A moderate amount of associated para muscular inflammatory fat stranding is noted. A surgical drain is seen extending across this region, with its distal tip located along the posterolateral aspect of the mid to lower right abdominal wall (axial CT image 37, CT series 2). No abdominopelvic ascites. Musculoskeletal: Bilateral breast implants are noted. No acute osseous abnormality is identified. IMPRESSION: 1. Postoperative changes along the anterior abdominal wall, as described above. 2. Large amount of retained stool within the distal sigmoid colon and rectum, consistent with constipation. 3. 2 mm  nonobstructing right renal calculi. 4. Stable hepatic. Electronically Signed   By: Virgina Norfolk M.D.   On: 01/15/2022 17:14    Pertinent labs & imaging results that were available during my care of the patient were reviewed by me and considered in my medical decision making (see MDM for details).  Medications Ordered in ED Medications  sodium chloride 0.9 % bolus 1,000 mL (1,000 mLs Intravenous New Bag/Given 01/15/22 1541)  ketorolac (TORADOL) 30 MG/ML injection 30 mg (30 mg Intravenous Given 01/15/22 1539)  ondansetron (ZOFRAN) injection 4 mg (4 mg Intravenous Given 01/15/22 1538)  iohexol (OMNIPAQUE) 300 MG/ML solution 100 mL (75 mLs Intravenous Contrast Given 01/15/22 1650)  sodium phosphate (FLEET) 7-19  GM/118ML enema 1 enema (1 enema Rectal Given 01/15/22 1758)                                                                                                                                     Procedures Procedures  (including critical care time)  Medical Decision Making / ED Course   MDM:  36 year old female presenting to the emergency department with abdominal pain.  Patient somewhat uncomfortable appearing.  Exam with multiple healing surgical incisions, lower abdominal tenderness.  Differential includes postoperative complication, ileus, constipation, perforation.  Will obtain CT scan.  Will obtain lab testing.  Will obtain urinalysis.  We will treat symptoms and give fluids.  Will reassess.  Clinical Course as of 01/15/22 1833  Thu Jan 15, 2022  1831  able to have a large bowel movement after enema.  The scan without any postoperative complication or ileus.  Advised to continue bowel regimen at home.  Increase MiraLAX frequency to 2 or 3 times a day until having regular bowel movements. Will discharge patient to home. All questions answered. Patient comfortable with plan of discharge. Return precautions discussed with patient and specified on the after visit summary.  [WS]    Clinical Course User Index [WS] Cristie Hem, MD     Additional history obtained: -External records from outside source obtained and reviewed including: Chart review including previous notes, labs, imaging, consultation notes   Lab Tests: -I ordered, reviewed, and interpreted labs.   The pertinent results include:   Labs Reviewed  COMPREHENSIVE METABOLIC PANEL - Abnormal; Notable for the following components:      Result Value   AST 14 (*)    All other components within normal limits  CBC WITH DIFFERENTIAL/PLATELET - Abnormal; Notable for the following components:   WBC 16.7 (*)    Hemoglobin 11.6 (*)    HCT 35.4 (*)    Platelets 520 (*)    Neutro Abs 14.8 (*)    All other components  within normal limits  URINALYSIS, ROUTINE W REFLEX MICROSCOPIC - Abnormal; Notable for the following components:   Hgb urine dipstick MODERATE (*)    All other components within normal limits  PREGNANCY, URINE  Imaging Studies ordered: I ordered imaging studies including CT A/P On my interpretation imaging demonstrates constipation, no acute post-surgical or intraabdominal process I independently visualized and interpreted imaging. I agree with the radiologist interpretation   Medicines ordered and prescription drug management: Meds ordered this encounter  Medications   sodium chloride 0.9 % bolus 1,000 mL   ketorolac (TORADOL) 30 MG/ML injection 30 mg   ondansetron (ZOFRAN) injection 4 mg   iohexol (OMNIPAQUE) 300 MG/ML solution 100 mL   DISCONTD: sorbitol, milk of mag, mineral oil, glycerin (SMOG) enema   sodium phosphate (FLEET) 7-19 GM/118ML enema 1 enema    -I have reviewed the patients home medicines and have made adjustments as needed   Cardiac Monitoring: The patient was maintained on a cardiac monitor.  I personally viewed and interpreted the cardiac monitored which showed an underlying rhythm of: NSR   Reevaluation: After the interventions noted above, I reevaluated the patient and found that they have improved  Co morbidities that complicate the patient evaluation  Past Medical History:  Diagnosis Date   Anemia    Anxiety    Chronic kidney disease    Endometriosis of pelvis 04/21/2012   Diagnosed on pathology after operative laparoscopy on 04/21/2012    PONV (postoperative nausea and vomiting)       Dispostion: Discharge    Final Clinical Impression(s) / ED Diagnoses Final diagnoses:  Constipation, unspecified constipation type     This chart was dictated using voice recognition software.  Despite best efforts to proofread,  errors can occur which can change the documentation meaning.    Cristie Hem, MD 01/15/22 [id number]

## 2022-01-15 NOTE — Discharge Instructions (Signed)
We evaluated you for your abdominal pain.  Your CT scan did not show any dangerous problems related to your surgery.  You were able to have a bowel movement after an enema.  Please continue to take MiraLAX and Dulcolax at home.  Increase your MiraLAX to 3 times a day for now until you are having regular bowel movements.  After you are having regular bowel movements, you can decrease it back to once daily.  You can also buy senna over-the-counter which is another medicine which can help soften your stool.  Please return to the emergency department if you have any new or worsening symptoms such as vomiting, severe abdominal pain, fevers or chills, or any other concerning symptoms.

## 2022-01-15 NOTE — ED Notes (Signed)
Patient had large hard BM

## 2022-01-15 NOTE — ED Notes (Signed)
RN provided AVS using Teachback Method. Patient verbalizes understanding of Discharge Instructions. Opportunity for Questioning and Answers were provided by RN. Patient Discharged from ED ambulatory to home via Self. 

## 2022-01-15 NOTE — ED Triage Notes (Signed)
Pt arrives to ED with c/o rectal pain and constipation. Rectal pain started today after she tried an enema for constipation. She had surgery on 9/29 for x2 hernia rectus diastasis repair. She reports no BM since surgery.

## 2022-03-12 ENCOUNTER — Telehealth: Payer: Self-pay | Admitting: Family Medicine

## 2022-03-12 DIAGNOSIS — R3989 Other symptoms and signs involving the genitourinary system: Secondary | ICD-10-CM

## 2022-03-12 MED ORDER — NITROFURANTOIN MONOHYD MACRO 100 MG PO CAPS: 100.0000 mg | ORAL_CAPSULE | Freq: Two times a day (BID) | ORAL | 0 refills | Status: AC

## 2022-03-12 NOTE — Progress Notes (Signed)

## 2022-07-24 ENCOUNTER — Emergency Department (HOSPITAL_COMMUNITY)
Admission: EM | Admit: 2022-07-24 | Discharge: 2022-07-24 | Disposition: A | Discharge status: Home / Self Care | Payer: Managed Care, Other (non HMO) | Attending: Emergency Medicine | Admitting: Emergency Medicine

## 2022-07-24 ENCOUNTER — Emergency Department (HOSPITAL_COMMUNITY): Payer: Managed Care, Other (non HMO)

## 2022-07-24 ENCOUNTER — Other Ambulatory Visit: Payer: Self-pay

## 2022-07-24 ENCOUNTER — Encounter (HOSPITAL_COMMUNITY): Payer: Self-pay | Admitting: *Deleted

## 2022-07-24 DIAGNOSIS — R1011 Right upper quadrant pain: Secondary | ICD-10-CM | POA: Insufficient documentation

## 2022-07-24 DIAGNOSIS — N189 Chronic kidney disease, unspecified: Secondary | ICD-10-CM | POA: Insufficient documentation

## 2022-07-24 DIAGNOSIS — R197 Diarrhea, unspecified: Secondary | ICD-10-CM | POA: Diagnosis not present

## 2022-07-24 DIAGNOSIS — R112 Nausea with vomiting, unspecified: Secondary | ICD-10-CM | POA: Diagnosis not present

## 2022-07-24 DIAGNOSIS — R1013 Epigastric pain: Secondary | ICD-10-CM

## 2022-07-24 DIAGNOSIS — Z79899 Other long term (current) drug therapy: Secondary | ICD-10-CM | POA: Insufficient documentation

## 2022-07-24 DIAGNOSIS — Z1152 Encounter for screening for COVID-19: Secondary | ICD-10-CM | POA: Diagnosis not present

## 2022-07-24 LAB — URINALYSIS, ROUTINE W REFLEX MICROSCOPIC
Bilirubin Urine: NEGATIVE
Glucose, UA: NEGATIVE mg/dL
Ketones, ur: 5 mg/dL — AB
Leukocytes,Ua: NEGATIVE
Nitrite: NEGATIVE
Protein, ur: NEGATIVE mg/dL
Specific Gravity, Urine: 1.023 (ref 1.005–1.030)
pH: 5 (ref 5.0–8.0)

## 2022-07-24 LAB — CBC WITH DIFFERENTIAL/PLATELET
Abs Immature Granulocytes: 0.01 10*3/uL (ref 0.00–0.07)
Basophils Absolute: 0 10*3/uL (ref 0.0–0.1)
Basophils Relative: 1 %
Eosinophils Absolute: 0 10*3/uL (ref 0.0–0.5)
Eosinophils Relative: 1 %
HCT: 38.7 % (ref 36.0–46.0)
Hemoglobin: 11.4 g/dL — ABNORMAL LOW (ref 12.0–15.0)
Immature Granulocytes: 0 %
Lymphocytes Relative: 26 %
Lymphs Abs: 1.4 10*3/uL (ref 0.7–4.0)
MCH: 23.3 pg — ABNORMAL LOW (ref 26.0–34.0)
MCHC: 29.5 g/dL — ABNORMAL LOW (ref 30.0–36.0)
MCV: 79.1 fL — ABNORMAL LOW (ref 80.0–100.0)
Monocytes Absolute: 0.4 10*3/uL (ref 0.1–1.0)
Monocytes Relative: 8 %
Neutro Abs: 3.4 10*3/uL (ref 1.7–7.7)
Neutrophils Relative %: 64 %
Platelets: 323 10*3/uL (ref 150–400)
RBC: 4.89 MIL/uL (ref 3.87–5.11)
RDW: 14.7 % (ref 11.5–15.5)
WBC: 5.3 10*3/uL (ref 4.0–10.5)
nRBC: 0 % (ref 0.0–0.2)

## 2022-07-24 LAB — COMPREHENSIVE METABOLIC PANEL
ALT: 16 U/L (ref 0–44)
AST: 22 U/L (ref 15–41)
Albumin: 4.3 g/dL (ref 3.5–5.0)
Alkaline Phosphatase: 48 U/L (ref 38–126)
Anion gap: 6 (ref 5–15)
BUN: 16 mg/dL (ref 6–20)
CO2: 24 mmol/L (ref 22–32)
Calcium: 8.1 mg/dL — ABNORMAL LOW (ref 8.9–10.3)
Chloride: 109 mmol/L (ref 98–111)
Creatinine, Ser: 0.63 mg/dL (ref 0.44–1.00)
GFR, Estimated: >60 mL/min (ref >60)
Glucose, Bld: 90 mg/dL (ref 70–99)
Potassium: 4.1 mmol/L (ref 3.5–5.1)
Sodium: 139 mmol/L (ref 135–145)
Total Bilirubin: 0.5 mg/dL (ref 0.3–1.2)
Total Protein: 7.6 g/dL (ref 6.5–8.1)

## 2022-07-24 LAB — SARS CORONAVIRUS 2 BY RT PCR: SARS Coronavirus 2 by RT PCR: NEGATIVE

## 2022-07-24 LAB — RAPID URINE DRUG SCREEN, HOSP PERFORMED
Amphetamines: NOT DETECTED
Barbiturates: NOT DETECTED
Benzodiazepines: NOT DETECTED
Cocaine: NOT DETECTED
Opiates: NOT DETECTED
Tetrahydrocannabinol: NOT DETECTED

## 2022-07-24 LAB — I-STAT BETA HCG BLOOD, ED (MC, WL, AP ONLY): I-stat hCG, quantitative: <5 m[IU]/mL (ref <5)

## 2022-07-24 LAB — LIPASE, BLOOD: Lipase: 56 U/L — ABNORMAL HIGH (ref 11–51)

## 2022-07-24 MED ORDER — SODIUM CHLORIDE (PF) 0.9 % IJ SOLN
INTRAMUSCULAR | Status: AC
  Filled 2022-07-24: qty 50

## 2022-07-24 MED ORDER — ONDANSETRON HCL 4 MG PO TABS: 4.0000 mg | ORAL_TABLET | ORAL | 0 refills | Status: DC | PRN

## 2022-07-24 MED ORDER — FAMOTIDINE IN NACL 20-0.9 MG/50ML-% IV SOLN
20.0000 mg | Freq: Once | INTRAVENOUS | Status: AC
  Administered 2022-07-24: 20 mg via INTRAVENOUS
  Filled 2022-07-24: qty 50

## 2022-07-24 MED ORDER — ALUM & MAG HYDROXIDE-SIMETH 200-200-20 MG/5ML PO SUSP
30.0000 mL | Freq: Once | ORAL | Status: AC
  Administered 2022-07-24: 30 mL via ORAL
  Filled 2022-07-24: qty 30

## 2022-07-24 MED ORDER — SUCRALFATE 1 G PO TABS: 1.0000 g | ORAL_TABLET | Freq: Three times a day (TID) | ORAL | 0 refills | Status: DC

## 2022-07-24 MED ORDER — LIDOCAINE VISCOUS HCL 2 % MT SOLN
15.0000 mL | Freq: Once | OROMUCOSAL | Status: AC
  Administered 2022-07-24: 15 mL via ORAL
  Filled 2022-07-24: qty 15

## 2022-07-24 MED ORDER — SODIUM CHLORIDE 0.9 % IV BOLUS
1000.0000 mL | Freq: Once | INTRAVENOUS | Status: AC
  Administered 2022-07-24: 1000 mL via INTRAVENOUS

## 2022-07-24 MED ORDER — ONDANSETRON HCL 4 MG/2ML IJ SOLN
4.0000 mg | Freq: Once | INTRAMUSCULAR | Status: AC
  Administered 2022-07-24: 4 mg via INTRAVENOUS
  Filled 2022-07-24: qty 2

## 2022-07-24 MED ORDER — PANTOPRAZOLE SODIUM 20 MG PO TBEC: 20.0000 mg | DELAYED_RELEASE_TABLET | Freq: Every day | ORAL | 0 refills | Status: DC

## 2022-07-24 MED ORDER — IOHEXOL 300 MG/ML  SOLN
100.0000 mL | Freq: Once | INTRAMUSCULAR | Status: AC | PRN
  Administered 2022-07-24: 100 mL via INTRAVENOUS

## 2022-07-24 NOTE — ED Triage Notes (Signed)
Dizziness with nausea started on Sunday, Last night felt like abd was swelling and nausea increased, vomited x 2 on Sunday.

## 2022-07-24 NOTE — Discharge Instructions (Addendum)
There are many causes of abdominal pain. Most pain is not serious and goes away, but some pain gets worse, changes, or will not go away. Please return to the emergency department or see your doctor right away if you (or your family member) experience any of the following:  1. Pain that gets worse or moves to just one spot.  2. Pain that gets worse if you cough or sneeze.  3. Pain with going over a bump in the road.  4. Pain that does not get better in 24 hours.  5. Inability to keep down liquids (vomiting)-especially if you are making less urine.  6. Fainting.  7. Blood in the vomit or stool.  8. High fever or shaking chills.  9. Swelling of the abdomen.  10. Any new or worsening problem.      Follow-up Instructions  See your primary care provider if not completely better in the next 2-3 days.    Additional Instructions  No alcohol.  No caffeine, aspirin, or cigarettes.   Please return to the emergency department immediately for any new or concerning symptoms, or if you get worse.   It was a pleasure caring for you today in the emergency department.  Please return to the emergency department for any worsening or worrisome symptoms.

## 2022-07-24 NOTE — ED Provider Notes (Signed)
[place] EMERGENCY DEPARTMENT AT [facility] Provider Note  CSN: [id number] Arrival date & time: 07/24/22 7829  Chief Complaint(s) Emesis, Abdominal Pain, and Dizziness  HPI Shelly [name] is a 37 y.o. female with past medical history as below, significant for CKD, anxiety, anemia, prior hernia repair surgery and C-section who presents to the ED with complaint of abd pain, nausea, vomiting.  Symptom onset approximately 5 days ago.  Abdominal cramping, primarily in upper part of abdomen, epigastric, right upper quadrant.  Diarrhea since last night.  Poor p.o. intake, nausea, vomiting.  Intermittent lightheadedness associated with her nausea.  No similar symptoms in the past.  She took Pepto-Bismol with past days which did help with her abdominal cramping.  No recent travel or sick contact, no sufficient p.o. intake.  No fevers, no chills.    Past Medical History Past Medical History:  Diagnosis Date   Anemia    Anxiety    Chronic kidney disease    Endometriosis of pelvis 04/21/2012   Diagnosed on pathology after operative laparoscopy on 04/21/2012    PONV (postoperative nausea and vomiting)    Patient Active Problem List   Diagnosis Date Noted   Acute blood loss anemia (ABLA) 11/07/2020   Previous cesarean section 11/06/2020   Status post repeat low transverse cesarean section 7/27 11/06/2020   Postpartum care following cesarean delivery 7/27 11/06/2020   Anxiety 11/06/2020   History of anemia 11/06/2020   Previous cesarean delivery affecting pregnancy 11/06/2020   Home Medication(s) Prior to Admission medications   Medication Sig Start Date End Date Taking? Authorizing Provider  ondansetron (ZOFRAN) 4 MG tablet Take 1 tablet (4 mg total) by mouth every 4 (four) hours as needed for nausea or vomiting. 07/24/22  Yes Tanda Rockers A, DO  pantoprazole (PROTONIX) 20 MG tablet Take 1 tablet (20 mg total) by mouth daily for 14 days. 07/24/22 08/07/22 Yes Tanda Rockers A, DO   sucralfate (CARAFATE) 1 g tablet Take 1 tablet (1 g total) by mouth with breakfast, with lunch, and with evening meal for 7 days. 07/24/22 07/31/22 Yes Sloan Leiter, DO  iron polysaccharides (FERREX 150) 150 MG capsule Take 1 capsule (150 mg total) by mouth daily. 11/08/20   June Leap, CNM  lidocaine (LIDODERM) 5 % Place 1 patch onto the skin daily. Remove & Discard patch within 12 hours or as directed by MD 01/04/22   Sherian Maroon A, PA  magnesium oxide (MAG-OX) 400 (240 Mg) MG tablet Take 1 tablet (400 mg total) by mouth daily. 11/09/20   June Leap, CNM  methocarbamol (ROBAXIN) 500 MG tablet Take 1 tablet (500 mg total) by mouth 2 (two) times daily. 01/04/22   Sherian Maroon A, PA  oxyCODONE (OXY IR/ROXICODONE) 5 MG immediate release tablet Take 1 tablet (5 mg total) by mouth every 4 (four) hours as needed for moderate pain or severe pain. 11/08/20   June Leap, CNM  ipratropium (ATROVENT) 0.06 % nasal spray Place 2 sprays into both nostrils 4 (four) times daily. Patient not taking: Reported on 10/03/2019 01/15/19 10/03/19  Belinda Fisher, PA-C  levalbuterol [facility] HFA) 45 MCG/ACT inhaler Inhale 1-2 puffs into the lungs every 4 (four) hours as needed for wheezing or shortness of breath. Patient not taking: Reported on 10/03/2019 01/15/19 10/03/19  Belinda Fisher, PA-C  Past Surgical History Past Surgical History:  Procedure Laterality Date   BREAST ENHANCEMENT SURGERY     BREAST SURGERY     CESAREAN SECTION  09/17/00, 08/31/02, 06/14/08   x 3   CESAREAN SECTION N/A 11/29/2017   Procedure: Repeat CESAREAN SECTION;  Surgeon: Olivia Mackie, MD;  Location: [facility] BIRTHING SUITES;  Service: Obstetrics;  Laterality: N/A;  EDD: 12/05/17   CESAREAN SECTION N/A 11/06/2020   Procedure: Repeat CESAREAN SECTION;  Surgeon: Olivia Mackie, MD;  Location: [facility] LD ORS;  Service:  Obstetrics;  Laterality: N/A;  EDD: 11/10/20   CHROMOPERTUBATION  04/21/2012   Procedure: CHROMOPERTUBATION;  Surgeon: Tereso Newcomer, MD;  Location: [facility] ORS;  Service: Gynecology;  Laterality: N/A;   LAPAROSCOPIC LYSIS OF ADHESIONS  04/21/2012   Procedure: LAPAROSCOPIC LYSIS OF ADHESIONS;  Surgeon: Tereso Newcomer, MD;  Location: [facility] ORS;  Service: Gynecology;  Laterality: N/A;   LAPAROSCOPY  04/21/2012   Procedure: LAPAROSCOPY OPERATIVE;  Surgeon: Tereso Newcomer, MD;  Location: [facility] ORS;  Service: Gynecology;  Laterality: N/A;  Peritoneal biopsies   Family History Family History  Problem Relation Age of Onset   Breast cancer Mother    Heart attack Father    Breast cancer Paternal Aunt    Colon cancer Other        greatgrandfather and great uncle    Social History Social History   Tobacco Use   Smoking status: Never   Smokeless tobacco: Never  Vaping Use   Vaping Use: Never used  Substance Use Topics   Alcohol use: No   Drug use: No   Allergies Patient has no known allergies.  Review of Systems Review of Systems  Constitutional:  Negative for activity change and fever.  HENT:  Negative for facial swelling and trouble swallowing.   Eyes:  Negative for discharge and redness.  Respiratory:  Negative for cough and shortness of breath.   Cardiovascular:  Negative for chest pain and palpitations.  Gastrointestinal:  Positive for abdominal pain, diarrhea, nausea and vomiting.  Genitourinary:  Negative for dysuria and flank pain.  Musculoskeletal:  Negative for back pain and gait problem.  Skin:  Negative for pallor and rash.  Neurological:  Negative for syncope and headaches.    Physical Exam Vital Signs  I have reviewed the triage vital signs BP 109/75 (BP Location: Right Arm)   Pulse 78   Temp 98.7 F (37.1 C) (Oral)   Resp 15   Ht  (1.549 m)   Wt 60.8 kg   LMP  (LMP Unknown)   SpO2 100%   BMI 25.32 kg/m  Physical Exam Vitals and nursing note reviewed.   Constitutional:      General: She is not in acute distress.    Appearance: Normal appearance. She is well-developed.  HENT:     Head: Normocephalic and atraumatic.     Right Ear: External ear normal.     Left Ear: External ear normal.     Nose: Nose normal.     Mouth/Throat:     Mouth: Mucous membranes are moist.  Eyes:     General: No scleral icterus.       Right eye: No discharge.        Left eye: No discharge.  Cardiovascular:     Rate and Rhythm: Normal rate and regular rhythm.     Pulses: Normal pulses.     Heart sounds: Normal heart sounds.  Pulmonary:     Effort: Pulmonary effort is normal.  No respiratory distress.     Breath sounds: Normal breath sounds.  Abdominal:     General: Abdomen is flat.     Tenderness: There is abdominal tenderness in the right upper quadrant and epigastric area. There is no guarding or rebound.  Musculoskeletal:     Right lower leg: No edema.     Left lower leg: No edema.  Skin:    General: Skin is warm and dry.     Capillary Refill: Capillary refill takes less than 2 seconds.  Neurological:     Mental Status: She is alert and oriented to person, place, and time.     GCS: GCS eye subscore is 4. GCS verbal subscore is 5. GCS motor subscore is 6.  Psychiatric:        Mood and Affect: Mood normal.        Behavior: Behavior normal.     ED Results and Treatments Labs (all labs ordered are listed, but only abnormal results are displayed) Labs Reviewed  LIPASE, BLOOD - Abnormal; Notable for the following components:      Result Value   Lipase 56 (*)    All other components within normal limits  COMPREHENSIVE METABOLIC PANEL - Abnormal; Notable for the following components:   Calcium 8.1 (*)    All other components within normal limits  URINALYSIS, ROUTINE W REFLEX MICROSCOPIC - Abnormal; Notable for the following components:   APPearance HAZY (*)    Hgb urine dipstick SMALL (*)    Ketones, ur 5 (*)    Bacteria, UA RARE (*)    All  other components within normal limits  CBC WITH DIFFERENTIAL/PLATELET - Abnormal; Notable for the following components:   Hemoglobin 11.4 (*)    MCV 79.1 (*)    MCH 23.3 (*)    MCHC 29.5 (*)    All other components within normal limits  SARS CORONAVIRUS 2 BY RT PCR  RAPID URINE DRUG SCREEN, HOSP PERFORMED  I-STAT BETA HCG BLOOD, ED ([facility], [facility], [facility] ONLY)                                                                                                                          Radiology CT ABDOMEN PELVIS W CONTRAST  Result Date: 07/24/2022 CLINICAL DATA:  Epigastric pain. Dizziness and nausea which began on Sunday. Vomiting x2. EXAM: CT ABDOMEN AND PELVIS WITH CONTRAST TECHNIQUE: Multidetector CT imaging of the abdomen and pelvis was performed using the standard protocol following bolus administration of intravenous contrast. RADIATION DOSE REDUCTION: This exam was performed according to the departmental dose-optimization program which includes automated exposure control, adjustment of the mA and/or kV according to patient size and/or use of iterative reconstruction technique. CONTRAST:  OMNIPAQUE IOHEXOL 300 MG/ML  SOLN COMPARISON:  01/15/2022 FINDINGS: Lower chest: No acute abnormality. Hepatobiliary: Right lobe of liver cyst is unchanged measuring 2.7 cm, image 14/3. No suspicious enhancing liver lesions. Normal appearance of the gallbladder. No bile duct dilatation. Pancreas: Unremarkable.  No pancreatic ductal dilatation or surrounding inflammatory changes. Spleen: Normal in size without focal abnormality. Adrenals/Urinary Tract: Normal adrenal glands. Two right renal calculi are identified within the inferior pole collecting system of the right kidney measuring up to 3 mm. Punctate stone is noted in the upper pole of the right renal collecting system. No suspicious mass. No signs of obstructive uropathy. Urinary bladder appears normal. Stomach/Bowel: Stomach is within normal limits. The appendix is  visualized and is normal. No pathologic dilatation of the large or small bowel loops to suggest obstruction no significant bowel wall thickening, inflammation, or distension. Vascular/Lymphatic: No significant vascular findings are present. No enlarged abdominal or pelvic lymph nodes. Reproductive: Uterus appears normal. No adnexal mass identified bilaterally. Other: Trace fluid is noted within the cul-de-sac, image 64/3. This is a nonspecific finding in a premenopausal female. No fluid collections. No signs of pneumoperitoneum. Musculoskeletal: No acute or significant osseous findings. IMPRESSION: 1. No acute findings within the abdomen or pelvis. 2. Nonobstructing right renal calculi. 3. Trace free fluid in the cul-de-sac is nonspecific in a premenopausal female and may be physiologic. Electronically Signed   By: Signa Kell M.D.   On: 07/24/2022 11:13    Pertinent labs & imaging results that were available during my care of the patient were reviewed by me and considered in my medical decision making (see MDM for details).  Medications Ordered in ED Medications  ondansetron (ZOFRAN) injection 4 mg (4 mg Intravenous Given 07/24/22 0945)  famotidine (PEPCID) IVPB 20 mg premix (0 mg Intravenous Stopped 07/24/22 1035)  sodium chloride 0.9 % bolus 1,000 mL (0 mLs Intravenous Stopped 07/24/22 1035)  iohexol (OMNIPAQUE) 300 MG/ML solution 100 mL (100 mLs Intravenous Contrast Given 07/24/22 1051)  alum & mag hydroxide-simeth (MAALOX/MYLANTA) 200-200-20 MG/5ML suspension 30 mL (30 mLs Oral Given 07/24/22 1204)    And  lidocaine (XYLOCAINE) 2 % viscous mouth solution 15 mL (15 mLs Oral Given 07/24/22 1204)                                                                                                                                     Procedures Procedures  (including critical care time)  Medical Decision Making / ED Course    Medical Decision Making:    ARYONA APPLEBAUM is a 37 y.o. female with past  medical history as below, significant for CKD, anxiety, anemia, prior hernia repair surgery and C-section who presents to the ED with complaint of abd pain, nausea, vomiting.. The complaint involves an extensive differential diagnosis and also carries with it a high risk of complications and morbidity.  Serious etiology was considered. Ddx includes but is not limited to: Differential diagnosis includes but is not exclusive to acute cholecystitis, intrathoracic causes for epigastric abdominal pain, gastritis, duodenitis, pancreatitis, small bowel or large bowel obstruction, abdominal aortic aneurysm, hernia, gastritis, etc.   Complete initial physical exam performed, notably the patient  was  no acute distress, resting comfortably, mild tenderness to epigastrium right upper quadrant.  Not peritoneal..    Reviewed and confirmed nursing documentation for past medical history, family history, social history.  Vital signs reviewed.    Clinical Course as of 07/24/22 1330  Fri Jul 24, 2022  1159 Lipase(!): 56 Query mild pancreatitis, no chronic alcohol use or chronic NSAID use.  CT imaging is reassuring.  She is tolerating p.o. without difficulty.  Feeling better overall. [SG]  1200 Bacteria, UA(!): RARE [SG]  1200 Squamous Epithelial / HPF: 6-10 Likely contamination.  Low suspicion for UTI [SG]  1200 Hemoglobin(!): 11.4 Similar to her baseline [SG]  1311 Patient is feeling much better on recheck.  Tolerating p.o. HDS [SG]    Clinical Course User Index [SG] Tanda Rockers A, DO    Symptoms greatly improved, concern for possible viral syndrome versus mild pancreatitis.  Gastritis is also consideration.  She is feeling much better on recheck.  Recommend supportive care at home, close outpatient follow-up was encouraged.  Follow-up with PCP for stool sample if diarrhea continues, bland diet.  Strict return precautions were discussed   Patient presents with vomiting, diarrhea, and abdominal cramping. Sx  suggestive of enteritis or food born illness. Surgical or other more serious etiology appears very unlikely. The patient is improved with ED treatment. Will discharge with observation and symptomatic treatment. Abdominal pain warnings discussed.  The patient improved significantly and was discharged in stable condition. Detailed discussions were had with the patient regarding current findings, and need for close f/u with PCP or on call doctor. The patient has been instructed to return immediately if the symptoms worsen in any way for re-evaluation. Patient verbalized understanding and is in agreement with current care plan. All questions answered prior to discharge.    Additional history obtained: -Additional history obtained from na -External records from outside source obtained and reviewed including: Chart review including previous notes, labs, imaging, consultation notes including  Prior ED visits, primary care documentation, prior labs and imaging, medications   Lab Tests: -I ordered, reviewed, and interpreted labs.   The pertinent results include:   Labs Reviewed  LIPASE, BLOOD - Abnormal; Notable for the following components:      Result Value   Lipase 56 (*)    All other components within normal limits  COMPREHENSIVE METABOLIC PANEL - Abnormal; Notable for the following components:   Calcium 8.1 (*)    All other components within normal limits  URINALYSIS, ROUTINE W REFLEX MICROSCOPIC - Abnormal; Notable for the following components:   APPearance HAZY (*)    Hgb urine dipstick SMALL (*)    Ketones, ur 5 (*)    Bacteria, UA RARE (*)    All other components within normal limits  CBC WITH DIFFERENTIAL/PLATELET - Abnormal; Notable for the following components:   Hemoglobin 11.4 (*)    MCV 79.1 (*)    MCH 23.3 (*)    MCHC 29.5 (*)    All other components within normal limits  SARS CORONAVIRUS 2 BY RT PCR  RAPID URINE DRUG SCREEN, HOSP PERFORMED  I-STAT BETA HCG BLOOD, ED ([facility], [facility] ONLY)    Notable for as above  EKG   EKG Interpretation  Date/Time:    Ventricular Rate:    PR Interval:    QRS Duration:   QT Interval:    QTC Calculation:   R Axis:     Text Interpretation:           Imaging  Studies ordered: I ordered imaging studies including CTAP I independently visualized the following imaging with scope of interpretation limited to determining acute life threatening conditions related to emergency care; findings noted above, significant for stable I independently visualized and interpreted imaging. I agree with the radiologist interpretation   Medicines ordered and prescription drug management: Meds ordered this encounter  Medications   ondansetron (ZOFRAN) injection 4 mg   famotidine (PEPCID) IVPB 20 mg premix   sodium chloride 0.9 % bolus 1,000 mL   iohexol (OMNIPAQUE) 300 MG/ML solution 100 mL   AND Linked Order Group    alum & mag hydroxide-simeth (MAALOX/MYLANTA) 200-200-20 MG/5ML suspension 30 mL    lidocaine (XYLOCAINE) 2 % viscous mouth solution 15 mL   sucralfate (CARAFATE) 1 g tablet    Sig: Take 1 tablet (1 g total) by mouth with breakfast, with lunch, and with evening meal for 7 days.    Dispense:  21 tablet    Refill:  0   ondansetron (ZOFRAN) 4 MG tablet    Sig: Take 1 tablet (4 mg total) by mouth every 4 (four) hours as needed for nausea or vomiting.    Dispense:  12 tablet    Refill:  0   pantoprazole (PROTONIX) 20 MG tablet    Sig: Take 1 tablet (20 mg total) by mouth daily for 14 days.    Dispense:  14 tablet    Refill:  0    -I have reviewed the patients home medicines and have made adjustments as needed   Consultations Obtained: na   Cardiac Monitoring: The patient was maintained on a cardiac monitor.  I personally viewed and interpreted the cardiac monitored which showed an underlying rhythm of: SR  Social Determinants of Health:  na   Reevaluation: After the interventions noted above, I reevaluated  the patient and found that they have improved  Co morbidities that complicate the patient evaluation  Past Medical History:  Diagnosis Date   Anemia    Anxiety    Chronic kidney disease    Endometriosis of pelvis 04/21/2012   Diagnosed on pathology after operative laparoscopy on 04/21/2012    PONV (postoperative nausea and vomiting)       Dispostion: Disposition decision including need for hospitalization was considered, and patient discharged from emergency department.    Final Clinical Impression(s) / ED Diagnoses Final diagnoses:  Epigastric pain  Nausea vomiting and diarrhea     This chart was dictated using voice recognition software.  Despite best efforts to proofread,  errors can occur which can change the documentation meaning.    Tanda Rockers A, DO 07/24/22 1330

## 2022-08-28 ENCOUNTER — Telehealth: Payer: Managed Care, Other (non HMO) | Admitting: Nurse Practitioner

## 2022-08-28 DIAGNOSIS — J0140 Acute pansinusitis, unspecified: Secondary | ICD-10-CM

## 2022-08-28 MED ORDER — AMOXICILLIN-POT CLAVULANATE 875-125 MG PO TABS
1.0000 | ORAL_TABLET | Freq: Two times a day (BID) | ORAL | 0 refills | Status: AC
Start: 2022-08-28 — End: 2022-09-04

## 2022-08-28 NOTE — Progress Notes (Signed)

## 2022-10-23 ENCOUNTER — Ambulatory Visit
Admission: RE | Admit: 2022-10-23 | Discharge: 2022-10-23 | Disposition: A | Discharge status: Home / Self Care | Payer: Managed Care, Other (non HMO) | Source: Ambulatory Visit

## 2022-10-23 VITALS — BP 112/77 | HR 94 | Temp 98.9°F | Resp 16

## 2022-10-23 DIAGNOSIS — M79644 Pain in right finger(s): Secondary | ICD-10-CM | POA: Diagnosis not present

## 2022-10-23 DIAGNOSIS — G8929 Other chronic pain: Secondary | ICD-10-CM | POA: Diagnosis not present

## 2022-10-23 DIAGNOSIS — G5631 Lesion of radial nerve, right upper limb: Secondary | ICD-10-CM

## 2022-10-23 MED ORDER — DEXAMETHASONE SODIUM PHOSPHATE 10 MG/ML IJ SOLN
10.0000 mg | Freq: Once | INTRAMUSCULAR | Status: AC
  Administered 2022-10-23: 10 mg via INTRAMUSCULAR

## 2022-10-23 NOTE — ED Provider Notes (Signed)
[facility]    CSN: [id number] Arrival date & time: 10/23/22  1409      History   Chief Complaint Chief Complaint  Patient presents with   Wrist Pain    Pain/tingling/intermittent numbness of thumb, and shooting pain into forearm. - Entered by patient    HPI Shelly [name] is a 37 y.o. female.   Patient presents today with right wrist pain that began yesterday evening.  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  She denies wrist pain immediately after, however reports later that evening she began having pain at the bottom of her thumb shooting up to her forearm.  Reports overnight, the pain began shooting up to her right shoulder.  She reports she has had issues with her thumb being painful in the past at the base of the thumb and swelling at the base of the thumb.  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  She denies any numbness or tingling in the fingertips or any recent blunt trauma to the hand or wrist.  Has taken Tylenol and Advil for symptoms without improvement.    Past Medical History:  Diagnosis Date   Anemia    Anxiety    Chronic kidney disease    Endometriosis of pelvis 04/21/2012   Diagnosed on pathology after operative laparoscopy on 04/21/2012    PONV (postoperative nausea and vomiting)     Patient Active Problem List   Diagnosis Date Noted   Acute blood loss anemia (ABLA) 11/07/2020   Previous cesarean section 11/06/2020   Status post repeat low transverse cesarean section 7/27 11/06/2020   Postpartum care following cesarean delivery 7/27 11/06/2020   Anxiety 11/06/2020   History of anemia 11/06/2020   Previous cesarean delivery affecting pregnancy 11/06/2020    Past Surgical History:  Procedure Laterality Date   BREAST ENHANCEMENT SURGERY     BREAST SURGERY      CESAREAN SECTION  09/17/00, 08/31/02, 06/14/08   x 3   CESAREAN SECTION N/A 11/29/2017   Procedure: Repeat CESAREAN SECTION;  Surgeon: Olivia Mackie, MD;  [facility]: [facility];  Service: Obstetrics;  Laterality: N/A;  EDD: 12/05/17   CESAREAN SECTION N/A 11/06/2020   Procedure: Repeat CESAREAN SECTION;  Surgeon: Olivia Mackie, MD;  Location: [facility];  Service: Obstetrics;  Laterality: N/A;  EDD: 11/10/20   CHROMOPERTUBATION  04/21/2012   Procedure: CHROMOPERTUBATION;  Surgeon: Tereso Newcomer, MD;  Location: [facility];  Service: Gynecology;  Laterality: N/A;   LAPAROSCOPIC LYSIS OF ADHESIONS  04/21/2012   Procedure: LAPAROSCOPIC LYSIS OF ADHESIONS;  Surgeon: Tereso Newcomer, MD;  Location: [facility];  Service: Gynecology;  Laterality: N/A;   LAPAROSCOPY  04/21/2012   Procedure: LAPAROSCOPY OPERATIVE;  Surgeon: Tereso Newcomer, MD;  Location: [facility];  Service: Gynecology;  Laterality: N/A;  Peritoneal biopsies    OB History     Gravida  5   Para  5   Term  5   Preterm  0   AB  0   Living  5      SAB  0   IAB  0   Ectopic  0   Multiple  0   Live Births  2            Home  Medications    Prior to Admission medications   Medication Sig Start Date End Date Taking? Authorizing Provider  amphetamine-dextroamphetamine (ADDERALL XR) 5 MG 24 hr capsule Take 5 mg by mouth daily.   Yes [provider]  sertraline (ZOLOFT) 50 MG tablet Take 50 mg by mouth daily.   Yes [provider]  iron polysaccharides (FERREX 150) 150 MG capsule Take 1 capsule (150 mg total) by mouth daily. 11/08/20   June Leap, CNM  lidocaine (LIDODERM) 5 % Place 1 patch onto the skin daily. Remove & Discard patch within 12 hours or as directed by MD 01/04/22   Sherian Maroon A, PA  magnesium oxide (MAG-OX) 400 (240 Mg) MG tablet Take 1 tablet (400 mg total) by mouth daily. 11/09/20   June Leap, CNM  methocarbamol (ROBAXIN) 500 MG tablet Take 1 tablet (500 mg total) by  mouth 2 (two) times daily. 01/04/22   Peter Garter, PA  ondansetron (ZOFRAN) 4 MG tablet Take 1 tablet (4 mg total) by mouth every 4 (four) hours as needed for nausea or vomiting. 07/24/22   Tanda Rockers A, DO  oxyCODONE (OXY IR/ROXICODONE) 5 MG immediate release tablet Take 1 tablet (5 mg total) by mouth every 4 (four) hours as needed for moderate pain or severe pain. 11/08/20   June Leap, CNM  pantoprazole (PROTONIX) 20 MG tablet Take 1 tablet (20 mg total) by mouth daily for 14 days. 07/24/22 08/07/22  Sloan Leiter, DO  sucralfate (CARAFATE) 1 g tablet Take 1 tablet (1 g total) by mouth with breakfast, with lunch, and with evening meal for 7 days. 07/24/22 07/31/22  Tanda Rockers A, DO  ipratropium (ATROVENT) 0.06 % nasal spray Place 2 sprays into both nostrils 4 (four) times daily. Patient not taking: Reported on 10/03/2019 01/15/19 10/03/19  Belinda Fisher, PA-C  levalbuterol [facility] HFA) 45 MCG/ACT inhaler Inhale 1-2 puffs into the lungs every 4 (four) hours as needed for wheezing or shortness of breath. Patient not taking: Reported on 10/03/2019 01/15/19 10/03/19  Lurline Idol    Family History Family History  Problem Relation Age of Onset   Breast cancer Mother    Heart attack Father    Breast cancer Paternal Aunt    Colon cancer Other        greatgrandfather and great uncle    Social History Social History   Tobacco Use   Smoking status: Never   Smokeless tobacco: Never  Vaping Use   Vaping status: Never Used  Substance Use Topics   Alcohol use: No   Drug use: No     Allergies   Patient has no known allergies.   Review of Systems Review of Systems Per HPI  Physical Exam Triage Vital Signs ED Triage Vitals  Encounter Vitals Group     BP 10/23/22 1419 112/77     Systolic BP Percentile --      Diastolic BP Percentile --      Pulse Rate 10/23/22 1419 94     Resp 10/23/22 1419 16     Temp 10/23/22 1419 98.9 F (37.2 C)     Temp Source 10/23/22 1419 Oral      SpO2 10/23/22 1419 96 %     Weight --      Height --      Head Circumference --      Peak Flow --      Pain Score 10/23/22 1422 6     Pain  Loc --      Pain Education --      Exclude from Growth Chart --    No data found.  Updated Vital Signs BP 112/77 (BP Location: Left Arm)   Pulse 94   Temp 98.9 F (37.2 C) (Oral)   Resp 16   LMP 10/12/2022 (Approximate)   SpO2 96%   Visual Acuity Right Eye Distance:   Left Eye Distance:   Bilateral Distance:    Right Eye Near:   Left Eye Near:    Bilateral Near:     Physical Exam Vitals and nursing note reviewed.  Constitutional:      General: She is not in acute distress.    Appearance: Normal appearance. She is not toxic-appearing.  HENT:     Mouth/Throat:     Mouth: Mucous membranes are moist.     Pharynx: Oropharynx is clear.  Pulmonary:     Effort: Pulmonary effort is normal. No respiratory distress.  Musculoskeletal:     Comments: Inspection: no swelling, bruising, obvious deformity or redness to right wrist, hand, forearm, elbow Palpation: Diffusely tender to palpation along the radial nerve extending from the base of the thumb to the elbow; no obvious deformities palpated ROM: Full ROM to right wrist and hand, although painful Strength: 5/5 bilateral upper extremities Neurovascular: neurovascularly intact in bilateral upper extremities  Skin:    General: Skin is warm and dry.     Capillary Refill: Capillary refill takes less than 2 seconds.     Coloration: Skin is not jaundiced or pale.     Findings: No erythema.  Neurological:     Mental Status: She is alert and oriented to person, place, and time.  Psychiatric:        Behavior: Behavior is cooperative.      UC Treatments / Results  Labs (all labs ordered are listed, but only abnormal results are displayed) Labs Reviewed - No data to display  EKG   Radiology No results found.  Procedures Procedures (including critical care time)  Medications  Ordered in UC Medications  dexamethasone (DECADRON) injection 10 mg (10 mg Intramuscular Given 10/23/22 1446)    Initial Impression / Assessment and Plan / UC Course  I have reviewed the triage vital signs and the nursing notes.  Pertinent labs & imaging results that were available during my care of the patient were reviewed by me and considered in my medical decision making (see chart for details).   Patient is well-appearing, normotensive, afebrile, not tachycardic, not tachypneic, oxygenating well on room air.    1. Chronic pain of right thumb 2. Radial nerve irritation, right It sounds like the patient is dealing with exacerbated chronic right thumb pain that runs along the radial nerve Will give injection of IM corticosteroids to see if this helps decrease inflammation/pain Wrist brace provided to the patient Recommended follow-up with hand specialist with no improvement or worsening symptoms despite treatment and contact information given  The patient was given the opportunity to ask questions.  All questions answered to their satisfaction.  The patient is in agreement to this plan.    Final Clinical Impressions(s) / UC Diagnoses   Final diagnoses:  Chronic pain of right thumb  Radial nerve irritation, right     Discharge Instructions      It sounds like your radial nerve is inflamed.  We have given you an injection of Decadron today to help with pain and inflammation.  Wear the wrist brace to help keep  your wrist in a neutral position and avoid further irritation.  Follow up with a Hand Specialist if symptoms persist or worsen despite treatment, contact information has been given.    ED Prescriptions   None    PDMP not reviewed this encounter.   Valentino Nose, NP 10/23/22 1456

## 2022-10-23 NOTE — ED Triage Notes (Addendum)
Pt presents to UC w/ c/o right wrist pain that shoots to elbow and shoulder since yesterday. 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 Pt has hx of right thumb pain since 2017. Pt has tried ibuprofen and tylenol, and used an ace wrap bandage to manage pain.

## 2022-10-23 NOTE — Discharge Instructions (Signed)
It sounds like your radial nerve is inflamed.  We have given you an injection of Decadron today to help with pain and inflammation.  Wear the wrist brace to help keep your wrist in a neutral position and avoid further irritation.  Follow up with a Hand Specialist if symptoms persist or worsen despite treatment, contact information has been given.

## 2023-06-02 ENCOUNTER — Inpatient Hospital Stay: Payer: Managed Care, Other (non HMO) | Admitting: Medical Oncology

## 2023-06-02 ENCOUNTER — Other Ambulatory Visit: Payer: Managed Care, Other (non HMO)

## 2023-06-07 ENCOUNTER — Inpatient Hospital Stay: Payer: Managed Care, Other (non HMO) | Attending: Medical Oncology | Admitting: Medical Oncology

## 2023-06-07 ENCOUNTER — Encounter: Payer: Self-pay | Admitting: Medical Oncology

## 2023-06-07 ENCOUNTER — Inpatient Hospital Stay: Payer: Managed Care, Other (non HMO)

## 2023-06-07 VITALS — HR 88 | Temp 98.5°F | Resp 18 | Ht 61.0 in | Wt 131.4 lb

## 2023-06-07 DIAGNOSIS — D509 Iron deficiency anemia, unspecified: Secondary | ICD-10-CM | POA: Diagnosis present

## 2023-06-07 DIAGNOSIS — K5900 Constipation, unspecified: Secondary | ICD-10-CM | POA: Insufficient documentation

## 2023-06-07 DIAGNOSIS — Z803 Family history of malignant neoplasm of breast: Secondary | ICD-10-CM | POA: Diagnosis not present

## 2023-06-07 DIAGNOSIS — R21 Rash and other nonspecific skin eruption: Secondary | ICD-10-CM | POA: Insufficient documentation

## 2023-06-07 DIAGNOSIS — L509 Urticaria, unspecified: Secondary | ICD-10-CM | POA: Diagnosis not present

## 2023-06-07 DIAGNOSIS — N189 Chronic kidney disease, unspecified: Secondary | ICD-10-CM | POA: Diagnosis not present

## 2023-06-07 DIAGNOSIS — Z331 Pregnant state, incidental: Secondary | ICD-10-CM | POA: Diagnosis not present

## 2023-06-07 DIAGNOSIS — E559 Vitamin D deficiency, unspecified: Secondary | ICD-10-CM | POA: Diagnosis not present

## 2023-06-07 DIAGNOSIS — N920 Excessive and frequent menstruation with regular cycle: Secondary | ICD-10-CM | POA: Diagnosis not present

## 2023-06-07 DIAGNOSIS — E639 Nutritional deficiency, unspecified: Secondary | ICD-10-CM | POA: Diagnosis not present

## 2023-06-07 DIAGNOSIS — D649 Anemia, unspecified: Secondary | ICD-10-CM | POA: Diagnosis not present

## 2023-06-07 DIAGNOSIS — D500 Iron deficiency anemia secondary to blood loss (chronic): Secondary | ICD-10-CM | POA: Diagnosis not present

## 2023-06-07 DIAGNOSIS — Z800 Family history of malignant neoplasm of digestive organs: Secondary | ICD-10-CM | POA: Insufficient documentation

## 2023-06-07 DIAGNOSIS — Z79899 Other long term (current) drug therapy: Secondary | ICD-10-CM | POA: Diagnosis not present

## 2023-06-07 LAB — CBC WITH DIFFERENTIAL (CANCER CENTER ONLY)
Abs Immature Granulocytes: 0.09 10*3/uL — ABNORMAL HIGH (ref 0.00–0.07)
Basophils Absolute: 0.1 10*3/uL (ref 0.0–0.1)
Basophils Relative: 1 %
Eosinophils Absolute: 0.1 10*3/uL (ref 0.0–0.5)
Eosinophils Relative: 1 %
HCT: 38.2 % (ref 36.0–46.0)
Hemoglobin: 12.4 g/dL (ref 12.0–15.0)
Immature Granulocytes: 1 %
Lymphocytes Relative: 22 %
Lymphs Abs: 1.7 10*3/uL (ref 0.7–4.0)
MCH: 27 pg (ref 26.0–34.0)
MCHC: 32.5 g/dL (ref 30.0–36.0)
MCV: 83 fL (ref 80.0–100.0)
Monocytes Absolute: 0.5 10*3/uL (ref 0.1–1.0)
Monocytes Relative: 7 %
Neutro Abs: 5.4 10*3/uL (ref 1.7–7.7)
Neutrophils Relative %: 68 %
Platelet Count: 355 10*3/uL (ref 150–400)
RBC: 4.6 MIL/uL (ref 3.87–5.11)
RDW: 13.2 % (ref 11.5–15.5)
WBC Count: 7.8 10*3/uL (ref 4.0–10.5)
nRBC: 0 % (ref 0.0–0.2)

## 2023-06-07 LAB — RETIC PANEL
Immature Retic Fract: 8.6 % (ref 2.3–15.9)
RBC.: 4.62 MIL/uL (ref 3.87–5.11)
Retic Count, Absolute: 42.5 10*3/uL (ref 19.0–186.0)
Retic Ct Pct: 0.9 % (ref 0.4–3.1)
Reticulocyte Hemoglobin: 32.6 pg (ref >27.9)

## 2023-06-07 LAB — LACTATE DEHYDROGENASE: LDH: 142 U/L (ref 98–192)

## 2023-06-07 LAB — CMP (CANCER CENTER ONLY)
ALT: 7 U/L (ref 0–44)
AST: 12 U/L — ABNORMAL LOW (ref 15–41)
Albumin: 4.8 g/dL (ref 3.5–5.0)
Alkaline Phosphatase: 54 U/L (ref 38–126)
Anion gap: 9 (ref 5–15)
BUN: 15 mg/dL (ref 6–20)
CO2: 28 mmol/L (ref 22–32)
Calcium: 9.9 mg/dL (ref 8.9–10.3)
Chloride: 102 mmol/L (ref 98–111)
Creatinine: 0.84 mg/dL (ref 0.44–1.00)
GFR, Estimated: >60 mL/min (ref >60)
Glucose, Bld: 91 mg/dL (ref 70–99)
Potassium: 3.7 mmol/L (ref 3.5–5.1)
Sodium: 139 mmol/L (ref 135–145)
Total Bilirubin: 0.6 mg/dL (ref 0.0–1.2)
Total Protein: 7.5 g/dL (ref 6.5–8.1)

## 2023-06-07 LAB — C-REACTIVE PROTEIN: CRP: 0.6 mg/dL (ref <1.0)

## 2023-06-07 LAB — SAVE SMEAR(SSMR), FOR PROVIDER SLIDE REVIEW

## 2023-06-07 LAB — FOLATE: Folate: 9.7 ng/mL (ref >5.9)

## 2023-06-07 LAB — SEDIMENTATION RATE: Sed Rate: 15 mm/h (ref 0–22)

## 2023-06-07 LAB — VITAMIN B12: Vitamin B-12: 363 pg/mL (ref 180–914)

## 2023-06-07 LAB — FERRITIN: Ferritin: 7 ng/mL — ABNORMAL LOW (ref 11–307)

## 2023-06-07 LAB — VITAMIN D 25 HYDROXY (VIT D DEFICIENCY, FRACTURES): Vit D, 25-Hydroxy: 30.06 ng/mL (ref 30–100)

## 2023-06-07 NOTE — Progress Notes (Signed)
 [facility] [phone]   [phone]  INITIAL CONSULT NOTE  Patient Care Team: Chyrel Masson as PCP - General (Physician Assistant)  CHIEF COMPLAINTS/PURPOSE OF CONSULTATION:  Iron Deficiency Anemia  HISTORY OF PRESENTING ILLNESS:  Shelly [name] 38 y.o. female is referred to our office by their PCP PA Earl Gala for IDA:  She has a long standing history of IDA first discovered when she was pregnant with her first child. She had routine blood work completed by her GYN and needed a blood transfusion. She then received IV iron during this and subsequent pregnancies. She completed three weekly IV iron infusions each time- name of infusion unknown. This worked well for her and she had not side effects. She did take benadryl during these infusions and then has had them without and tolerated well too.   Last iron infusion was in the fall of 2024. She received 3 weeks of IV iron and had labs checked about 1-2 months later showing very little improvement in her ferritin.   In the last 2-3 weeks she has noticed some occasional hives of her arms, neck or back. She has also noticed some bilateral knee aches and a facial rash. They last about 20 minutes and then resolve. No recent infections or viral illnesses. No new exposures. No known food triggers. Everyday around 4 pm she has had some facial flushing. No known rheumatological conditions.   Blood in stools:No Change of bowel movement/constipation: No Family history of GI cancers: No Last Colonoscopy: 2014 Last Endoscopy: 2014 History of GERD or GI bleed: No UC/Crohn's: No PPI use: No Hiatal Hernia: No NSAID use: No Blood in urine: No Difficulty swallowing: No Pica: Yes SOB: Yes Fatigue: Yes Prior blood transfusion: Yes- last was in 2010- during pregnancy - tolerated it well  Prior history of blood loss: No Liver disease: No Kidney Disease: No Bariatric/Intestinal surgery: No other than 2  hernia repairs Frequent Blood Donation: No Vaginal bleeding: Yes. Regular. Heavy. No plans for upcoming pregnancies  Prior evaluation with hematology: Yes- see above Prior bone marrow biopsy: No Oral iron: No- severe constipation Prior IV iron infusions: Yes- see above Family history Anemia: No known sickle cell or thalassemia  No Personal or family history of bleeding or clotting disorders.  No family history of autoimmune disease Special diets: No No use of GLP-1: No Iron rich foods in diet: Yes Eating habits: Yes  S/p tubal ligation   MEDICAL HISTORY:  Past Medical History:  Diagnosis Date   Anemia    Anxiety    Chronic kidney disease    Endometriosis of pelvis 04/21/2012   Diagnosed on pathology after operative laparoscopy on 04/21/2012    PONV (postoperative nausea and vomiting)     SURGICAL HISTORY: Past Surgical History:  Procedure Laterality Date   BREAST ENHANCEMENT SURGERY     BREAST SURGERY     CESAREAN SECTION  09/17/00, 08/31/02, 06/14/08   x 3   CESAREAN SECTION N/A 11/29/2017   Procedure: Repeat CESAREAN SECTION;  Surgeon: Olivia Mackie, MD;  Location: [facility];  Service: Obstetrics;  Laterality: N/A;  EDD: 12/05/17   CESAREAN SECTION N/A 11/06/2020   Procedure: Repeat CESAREAN SECTION;  Surgeon: Olivia Mackie, MD;  Location: [facility];  Service: Obstetrics;  Laterality: N/A;  EDD: 11/10/20   CHROMOPERTUBATION  04/21/2012   Procedure: CHROMOPERTUBATION;  Surgeon: Tereso Newcomer, MD;  Location: [facility];  Service: Gynecology;  Laterality: N/A;   LAPAROSCOPIC LYSIS OF ADHESIONS  04/21/2012   Procedure: LAPAROSCOPIC LYSIS OF ADHESIONS;  Surgeon: Tereso Newcomer, MD;  Location: [facility];  Service: Gynecology;  Laterality: N/A;   LAPAROSCOPY  04/21/2012   Procedure: LAPAROSCOPY OPERATIVE;  Surgeon: Tereso Newcomer, MD;  Location: [facility];  Service: Gynecology;  Laterality: N/A;  Peritoneal biopsies    SOCIAL HISTORY: Social History   Socioeconomic  History   Marital status: Divorced    Spouse name: Not on file   Number of children: 3   Years of education: Not on file   Highest education level: Not on file  Occupational History   Occupation: student  Tobacco Use   Smoking status: Never   Smokeless tobacco: Never  Vaping Use   Vaping status: Never Used  Substance and Sexual Activity   Alcohol use: No   Drug use: No   Sexual activity: Yes    Birth control/protection: None  Other Topics Concern   Not on file  Social History Narrative   Not on file   Social Drivers of Health   Financial Resource Strain: Not on file  Food Insecurity: No Food Insecurity (06/07/2023)   Hunger Vital Sign    Worried About Running Out of Food in the Last Year: Never true    Ran Out of Food in the Last Year: Never true  Transportation Needs: No Transportation Needs (06/07/2023)   PRAPARE - Administrator, Civil Service (Medical): No    Lack of Transportation (Non-Medical): No  Physical Activity: Not on file  Stress: Not on file  Social Connections: Not on file  Intimate Partner Violence: Not At Risk (06/07/2023)   Humiliation, Afraid, Rape, and Kick questionnaire    Fear of Current or Ex-Partner: No    Emotionally Abused: No    Physically Abused: No    Sexually Abused: No    FAMILY HISTORY: Family History  Problem Relation Age of Onset   Breast cancer Mother    Heart attack Father    Breast cancer Paternal Aunt    Colon cancer Other        greatgrandfather and great uncle    ALLERGIES:  has no known allergies.  MEDICATIONS:  Current Outpatient Medications  Medication Sig Dispense Refill   lisdexamfetamine (VYVANSE) 30 MG capsule Take 1 capsule by mouth every morning.     sertraline (ZOLOFT) 50 MG tablet Take 50 mg by mouth daily.     No current facility-administered medications for this visit.    REVIEW OF SYSTEMS:   Constitutional: ( - ) fevers, ( - )  chills , ( - ) night sweats Eyes: ( - ) blurriness of vision,  ( - ) double vision, ( - ) watery eyes Ears, nose, mouth, throat, and face: ( - ) mucositis, ( - ) sore throat Respiratory: ( - ) cough, ( - ) dyspnea, ( - ) wheezes Cardiovascular: ( + ) palpitation, ( - ) chest discomfort, ( - ) lower extremity swelling Gastrointestinal:  ( - ) nausea, ( - ) heartburn, ( - ) change in bowel habits Skin: ( + ) abnormal skin rashes Lymphatics: ( - ) new lymphadenopathy, ( - ) easy bruising Neurological: ( - ) numbness, ( - ) tingling, ( - ) new weaknesses Behavioral/Psych: ( - ) mood change, ( - ) new changes  All other systems were reviewed with the patient and are negative.  PHYSICAL EXAMINATION: ECOG PERFORMANCE STATUS: 1 - Symptomatic but completely ambulatory  Vitals:   06/07/23 1358  Pulse: 88  Resp: 18  Temp: 98.5 F (36.9 C)  SpO2: 100%   Filed Weights   06/07/23 1358  Weight: 131 lb 6.4 oz (59.6 kg)    GENERAL: well appearing female in NAD  SKIN: skin color, texture, turgor are normal, no rashes or significant lesions EYES: conjunctiva are pink and non-injected, sclera clear OROPHARYNX: no exudate, no erythema; lips, buccal mucosa, and tongue normal  NECK: supple, non-tender LYMPH:  no palpable lymphadenopathy in the cervical, axillary or supraclavicular lymph nodes.  LUNGS: clear to auscultation and percussion with normal breathing effort HEART: regular rate & rhythm and no murmurs and no lower extremity edema ABDOMEN: soft, non-tender, non-distended, normal bowel sounds Musculoskeletal: no cyanosis of digits and no clubbing  PSYCH: alert & oriented x 3, fluent speech NEURO: no focal motor/sensory deficits  LABORATORY DATA:  Pending   ASSESSMENT & PLAN Shelly [name] is a 38 y.o. female who was referred to [place] for IDA.   Her IDA seems to stem from her heavy menses however it is interesting to note that when she was pregnant the iron infusions were very effective; now they are not and she has concurrent autoimmune symptoms. I do  question if she may have some autoimmune process causing malabsorption. Labs pending.   We have also discussed trailing a different type of iron to see if it would be more effective. It sounds as if she previously had Venofer. I would suggest Feraheme or Monoferric. Iron studied pending- if below target we will order additional IV iron.   RTC 2 months APP, labs (CBC, retic, iron, ferritin)  All questions were answered. The patient knows to call the clinic with any problems, questions or concerns.  I have spent a total of 40 minutes minutes of face-to-face and non-face-to-face time, preparing to see the patient, obtaining and/or reviewing separately obtained history, performing a medically appropriate examination, counseling and educating the patient, ordering medications/tests/procedures, referring and communicating with other health care professionals, documenting clinical information in the electronic health record, independently interpreting results and communicating results to the patient, and care coordination.    Clent Jacks PA-C Department of Hematology/Oncology [facility]

## 2023-06-08 ENCOUNTER — Encounter: Payer: Self-pay | Admitting: Medical Oncology

## 2023-06-08 ENCOUNTER — Other Ambulatory Visit: Payer: Self-pay | Admitting: Medical Oncology

## 2023-06-08 DIAGNOSIS — E611 Iron deficiency: Secondary | ICD-10-CM | POA: Insufficient documentation

## 2023-06-08 LAB — IRON AND IRON BINDING CAPACITY (CC-WL,HP ONLY)
Iron: 52 ug/dL (ref 28–170)
Saturation Ratios: 11 % (ref 10.4–31.8)
TIBC: 486 ug/dL — ABNORMAL HIGH (ref 250–450)
UIBC: 434 ug/dL (ref 148–442)

## 2023-06-12 LAB — ANTINUCLEAR ANTIBODIES, IFA: ANA Ab, IFA: NEGATIVE

## 2023-06-21 ENCOUNTER — Telehealth: Payer: Self-pay | Admitting: Medical Oncology

## 2023-06-21 NOTE — Telephone Encounter (Signed)
 Called to schedule IV Iron (approved for Monoferric). LVM to return call for scheduling.

## 2023-06-25 ENCOUNTER — Telehealth: Payer: Self-pay | Admitting: Hematology & Oncology

## 2023-06-25 NOTE — Telephone Encounter (Signed)
 Called to schedule IV Iron per inbasket. LVM to return call for scheduling.

## 2023-06-29 ENCOUNTER — Telehealth: Payer: Self-pay | Admitting: Medical Oncology

## 2023-06-29 NOTE — Telephone Encounter (Signed)
 Called to schedule IV Iron (monoferric) per inbasket. LVM to return call for scheduling. 3rd attempt to reach out; closing inbasket.

## 2023-08-05 ENCOUNTER — Inpatient Hospital Stay: Payer: Managed Care, Other (non HMO)

## 2023-08-05 ENCOUNTER — Inpatient Hospital Stay: Payer: Managed Care, Other (non HMO) | Admitting: Medical Oncology

## 2023-11-22 ENCOUNTER — Encounter: Payer: Self-pay | Admitting: Hematology & Oncology

## 2023-12-05 ENCOUNTER — Telehealth: Payer: Self-pay | Admitting: Family

## 2023-12-05 DIAGNOSIS — J019 Acute sinusitis, unspecified: Secondary | ICD-10-CM

## 2023-12-05 MED ORDER — AMOXICILLIN-POT CLAVULANATE 875-125 MG PO TABS: 1.0000 | ORAL_TABLET | Freq: Two times a day (BID) | ORAL | 0 refills | Status: AC

## 2023-12-05 NOTE — Progress Notes (Signed)

## 2023-12-23 ENCOUNTER — Encounter: Payer: Self-pay | Admitting: Hematology & Oncology

## 2024-01-11 ENCOUNTER — Encounter: Payer: Self-pay | Admitting: Sports Medicine

## 2024-01-11 ENCOUNTER — Other Ambulatory Visit (INDEPENDENT_AMBULATORY_CARE_PROVIDER_SITE_OTHER): Payer: Self-pay

## 2024-01-11 ENCOUNTER — Ambulatory Visit (INDEPENDENT_AMBULATORY_CARE_PROVIDER_SITE_OTHER): Payer: Self-pay | Admitting: Sports Medicine

## 2024-01-11 DIAGNOSIS — M51362 Other intervertebral disc degeneration, lumbar region with discogenic back pain and lower extremity pain: Secondary | ICD-10-CM

## 2024-01-11 DIAGNOSIS — G8929 Other chronic pain: Secondary | ICD-10-CM | POA: Diagnosis not present

## 2024-01-11 DIAGNOSIS — M5441 Lumbago with sciatica, right side: Secondary | ICD-10-CM

## 2024-01-11 MED ORDER — MELOXICAM 15 MG PO TABS: 15.0000 mg | ORAL_TABLET | Freq: Every day | ORAL | 0 refills | Status: DC

## 2024-01-11 MED ORDER — GABAPENTIN 300 MG PO CAPS: 300.0000 mg | ORAL_CAPSULE | Freq: Every day | ORAL | 0 refills | Status: AC

## 2024-01-11 NOTE — Progress Notes (Signed)
 Shelly [name] - 38 y.o. female MRN [id number]  Date of birth: 06-23-1985  Office Visit Note: Visit Date: 01/11/2024 PCP: Shelly Tari DASEN, PA-C Referred by: Shelly Tari DASEN PA-C  Subjective: Chief Complaint  Patient presents with   Lower Back - Pain   HPI: Shelly [name] is a pleasant 37 y.o. female who presents today for chronic low back pain with RLE numbness/tingling.  [name] has had low back pain since an episode in late July where she was lying on her back with her knees bent when one of her children ran and jumped on her.  This caused her legs to go awkwardly to the right and she felt a sharp pain.  She has gone to the urgent care but they did not obtain x-rays.  They prescribed her gabapentin, Robaxin  and recommended physical therapy.  She has not yet been able to start physical therapy as she was finding a facility that had appropriate insurance coverage.  She does feel the gabapentin helps more of her numbness and tingling pain going down the leg, muscle relaxer is only transiently helpful.  She will have a sharp 'zinging electric-like pain that goes down from the low back to the posterior thigh and also in the lateral ankle and foot.  She also reports pain and stiffness more so over the right side of the low back.   In the past she was prescribed oral prednisone  but could only take for 5 days given the side effects.  Pertinent ROS were reviewed with the patient and found to be negative unless otherwise specified above in HPI.   Assessment & Plan: Visit Diagnoses:  1. Degeneration of intervertebral disc of lumbar region with discogenic back pain and lower extremity pain   2. Chronic right-sided low back pain with right-sided sciatica    Plan: Impression is chronic and persisting low back pain with mild degenerative disc disease, although symptoms very specific for right leg radiculopathy.  Based on patent shin symptomatology and physical exam, I am highly suspicious for  disc pathology (bulging disc, etc.). Her radicular symptoms are most suggestive of a right-sided L5 or S1 dermatome.  Given her nerve related symptoms, she will continue gabapentin 300 mg daily.  I would like to place her on a short course of anti-inflammatory, she will begin meloxicam  15 mg for the next 2-3 weeks scheduled and then discontinue.  Following this she may increase gabapentin up to 300 mg twice daily if tolerating.  We will have her discontinue her Robaxin .  Will get her started in formalized physical therapy for the low back and posterior chain.  I will see her back in about 6 weeks to reevaluate how she is doing.  If not significantly improving, next steps may include MRI of the lumbar spine to evaluate for discogenic pathology and neural impingement.  Follow-up: Return in about 6 weeks (around 02/22/2024) for R-low back pain with RLE radic.   Meds & Orders:  Meds ordered this encounter  Medications   meloxicam  (MOBIC ) 15 MG tablet    Sig: Take 1 tablet (15 mg total) by mouth daily.    Dispense:  21 tablet    Refill:  0   gabapentin (NEURONTIN) 300 MG capsule    Sig: Take 1 capsule (300 mg total) by mouth at bedtime.    Dispense:  45 capsule    Refill:  0    Orders Placed This Encounter  Procedures   XR Lumbar Spine Complete   Ambulatory  referral to Physical Therapy     Procedures: No procedures performed      Clinical History: No specialty comments available.  She reports that she has never smoked. She has never used smokeless tobacco. No results for input(s): HGBA1C, LABURIC in the last 8760 hours.  Objective:    Physical Exam  Gen: Well-appearing, in no acute distress; non-toxic CV: Well-perfused. Warm.  Resp: Breathing unlabored on room air; no wheezing. Psych: Fluid speech in conversation; appropriate affect; normal thought process  Ortho Exam - Lumbar: There is generalized tenderness at the L4-L5 region with right sided lumbar paraspinal hypertonicity.   There is full range of motion with flexion and extension although extension significantly worsens her pain, specifically with axial loading. + Straight leg raise on the right, negative on the left.  There is 5/5 strength of bilateral lower extremities, 2+ bilateral patellar and Achilles DTR.  Imaging: XR Lumbar Spine Complete Result Date: 01/11/2024 4 views of the lumbar spine including AP, lateral, flexion/extension views were ordered and reviewed by myself today.  X-rays demonstrate 5 nonrib-bearing lumbar vertebrae.  There is mild degenerative disc disease at the L4-L5 level.  There is mild anterior listhesis of L5 on S1.  There is no instability with flexion/extension views.  Past Medical/Family/Surgical/Social History: Medications & Allergies reviewed per EMR, new medications updated. Patient Active Problem List   Diagnosis Date Noted   Iron  deficiency 06/08/2023   Acute blood loss anemia (ABLA) 11/07/2020   Previous cesarean section 11/06/2020   Status post repeat low transverse cesarean section 7/27 11/06/2020   Postpartum care following cesarean delivery 7/27 11/06/2020   Anxiety 11/06/2020   History of anemia 11/06/2020   Previous cesarean delivery affecting pregnancy 11/06/2020   Past Medical History:  Diagnosis Date   Anemia    Anxiety    Chronic kidney disease    Endometriosis of pelvis 04/21/2012   Diagnosed on pathology after operative laparoscopy on 04/21/2012    PONV (postoperative nausea and vomiting)    Family History  Problem Relation Age of Onset   Breast cancer Mother    Heart attack Father    Breast cancer Paternal Aunt    Colon cancer Other        greatgrandfather and great uncle   Past Surgical History:  Procedure Laterality Date   BREAST ENHANCEMENT SURGERY     BREAST SURGERY     CESAREAN SECTION  09/17/00, 08/31/02, 06/14/08   x 3   CESAREAN SECTION N/A 11/29/2017   Procedure: Repeat CESAREAN SECTION;  Surgeon: Gorge Ade, MD;  Location: [facility];  Service: Obstetrics;  Laterality: N/A;  EDD: 12/05/17   CESAREAN SECTION N/A 11/06/2020   Procedure: Repeat CESAREAN SECTION;  Surgeon: Gorge Ade, MD;  Location: [facility];  Service: Obstetrics;  Laterality: N/A;  EDD: 11/10/20   CHROMOPERTUBATION  04/21/2012   Procedure: CHROMOPERTUBATION;  Surgeon: Gloris DELENA Hugger, MD;  Location: [facility];  Service: Gynecology;  Laterality: N/A;   LAPAROSCOPIC LYSIS OF ADHESIONS  04/21/2012   Procedure: LAPAROSCOPIC LYSIS OF ADHESIONS;  Surgeon: Gloris DELENA Hugger, MD;  Location: [facility];  Service: Gynecology;  Laterality: N/A;   LAPAROSCOPY  04/21/2012   Procedure: LAPAROSCOPY OPERATIVE;  Surgeon: Gloris DELENA Hugger, MD;  Location: [facility];  Service: Gynecology;  Laterality: N/A;  Peritoneal biopsies   Social History   Occupational History   Occupation: student  Tobacco Use   Smoking status: Never   Smokeless tobacco: Never  Vaping Use   Vaping status:  Never Used  Substance and Sexual Activity   Alcohol use: No   Drug use: No   Sexual activity: Yes    Birth control/protection: None

## 2024-01-11 NOTE — Progress Notes (Signed)
 Patient says that on the last day of July, she was laying on her back with her knees bent and feet flat on the floor when her child ran and jumped on her. She says that her legs went to the right, but her back stayed flat on the ground, and she felt an intense pain. She did go to Urgent Care where they prescribed Gabapentin and a muscle relaxer, and recommended PT. She has been taking the medication once daily in the evenings, but has not yet started PT due to insurance changes. She primarily has pain in the right lower back, as well as shooting pain in the posterior hip and right leg. At rest, she has pain in the right posterior hip and lateral right ankle and right little toe, but this pain will travel the whole leg when she is up and walking. She only gets relief when laying flat on her back. She does not notice pain when laying on her side, but does have discomfort when rolling over.

## 2024-02-01 ENCOUNTER — Encounter: Payer: Self-pay | Admitting: Sports Medicine

## 2024-02-07 ENCOUNTER — Encounter: Payer: Self-pay | Admitting: Hematology & Oncology

## 2024-02-07 ENCOUNTER — Other Ambulatory Visit: Payer: Self-pay | Admitting: Sports Medicine

## 2024-02-07 MED ORDER — DICLOFENAC SODIUM 50 MG PO TBEC
50.0000 mg | DELAYED_RELEASE_TABLET | Freq: Two times a day (BID) | ORAL | 0 refills | Status: AC
Start: 2024-02-07 — End: ?

## 2024-02-07 NOTE — Therapy (Signed)
 OUTPATIENT PHYSICAL THERAPY THORACOLUMBAR EVALUATION   Patient Name: IVORY MADURO MRN: [id number] DOB:July 24, 1985, 38 y.o., female Today's Date: 02/08/2024  END OF SESSION:  PT End of Session - 02/08/24 0803     Visit Number 1    Number of Visits 6    Date for Recertification  03/21/24    PT Start Time 0803    PT Stop Time 0839    PT Time Calculation (min) 36 min    Activity Tolerance Patient tolerated treatment well    Behavior During Therapy [facility] for tasks assessed/performed          Past Medical History:  Diagnosis Date   Anemia    Anxiety    Chronic kidney disease    Endometriosis of pelvis 04/21/2012   Diagnosed on pathology after operative laparoscopy on 04/21/2012    PONV (postoperative nausea and vomiting)    Past Surgical History:  Procedure Laterality Date   BREAST ENHANCEMENT SURGERY     BREAST SURGERY     CESAREAN SECTION  09/17/00, 08/31/02, 06/14/08   x 3   CESAREAN SECTION N/A 11/29/2017   Procedure: Repeat CESAREAN SECTION;  Surgeon: Gorge Ade, MD;  Location: [facility];  Service: Obstetrics;  Laterality: N/A;  EDD: 12/05/17   CESAREAN SECTION N/A 11/06/2020   Procedure: Repeat CESAREAN SECTION;  Surgeon: Gorge Ade, MD;  Location: [facility];  Service: Obstetrics;  Laterality: N/A;  EDD: 11/10/20   CHROMOPERTUBATION  04/21/2012   Procedure: CHROMOPERTUBATION;  Surgeon: Gloris DELENA Hugger, MD;  Location: [facility];  Service: Gynecology;  Laterality: N/A;   LAPAROSCOPIC LYSIS OF ADHESIONS  04/21/2012   Procedure: LAPAROSCOPIC LYSIS OF ADHESIONS;  Surgeon: Gloris DELENA Hugger, MD;  Location: [facility];  Service: Gynecology;  Laterality: N/A;   LAPAROSCOPY  04/21/2012   Procedure: LAPAROSCOPY OPERATIVE;  Surgeon: Gloris DELENA Hugger, MD;  Location: [facility];  Service: Gynecology;  Laterality: N/A;  Peritoneal biopsies   Patient Active Problem List   Diagnosis Date Noted   Iron  deficiency 06/08/2023   Acute blood loss anemia (ABLA) 11/07/2020   Previous  cesarean section 11/06/2020   Status post repeat low transverse cesarean section 7/27 11/06/2020   Postpartum care following cesarean delivery 7/27 11/06/2020   Anxiety 11/06/2020   History of anemia 11/06/2020   Previous cesarean delivery affecting pregnancy 11/06/2020    PCP: Tari ONEIDA Banter PA-C  REFERRING PROVIDER: Lonell Sprang, DO  REFERRING DIAG: [id number] (ICD-10-CM) - Chronic right-sided low back pain with right-sided sciatica M51.362 (ICD-10-CM) - Degeneration of intervertebral disc of lumbar region with discogenic back pain and lower extremity pain  Rationale for Evaluation and Treatment: Rehabilitation  THERAPY DIAG:  Other low back pain  Radiculopathy, lumbar region  Muscle weakness (generalized)  Other abnormalities of gait and mobility  Other muscle spasm  ONSET DATE: Summer 2025   SUBJECTIVE:  SUBJECTIVE STATEMENT: Some days are really bad.  PERTINENT HISTORY:  Patient reports onset of low back pain after a busy day then had a child land of her legs causing them to increase Rt rotation of the back with trunk staying stationary that lead to immediate onset of pain. Patient endorses pain that can intermittently radiate to Rt ankle. Patient endorses spending a lot of time flat on the ground and pain can get bad enough to need PTO. Patient reports no previous injuries, trauma, or surgeries to the area or surrounding joints.  PAIN:  Are you having pain? Yes: NPRS scale: 6/10 at rest, 10/10 at max  Pain location: Rt sided low back pain, Rt hip, and can hurt all the way down to ankle  Pain description: spasms, deep, varies  Aggravating factors: sitting for prolonged periods of times, driving, household chores, ADLs Relieving factors: lying flat on the floor,   PRECAUTIONS:  None  RED FLAGS: Bowel or bladder incontinence: No and Cauda equina syndrome: No   WEIGHT BEARING RESTRICTIONS: No  FALLS:  Has patient fallen in last 6 months? 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  DIAGNOSTIC FINDINGS:   X-rays demonstrate 5  nonrib-bearing lumbar vertebrae.  There is mild degenerative disc disease  at the L4-L5 level.  There is mild anterior listhesis of L5 on S1.  There  is no instability with flexion/extension views.   PATIENT SURVEYS:  PSFS: THE PATIENT SPECIFIC FUNCTIONAL SCALE  Place score of 0-10 (0 = unable to perform activity and 10 = able to perform activity at the same level as before injury or problem)  Activity Date: 02/08/2024    Household chores  2    2. Driving  4    3. Being more present  1    4.      Total Score 2.33      Total Score = Sum of activity scores/number of activities  Minimally Detectable Change: 3 points (for single activity); 2 points (for average score)  [facility] (nd). The Patient Specific Functional Scale . Retrieved from [email or web address].pt   COGNITION: Overall cognitive status: Within functional limits for tasks assessed     SENSATION: WFL  MUSCLE LENGTH: Not objectively measured, but tight   POSTURE: No Significant postural limitations  PALPATION: TTP in Rt lumbar musculature and Rt glute   LUMBAR ROM: painful in all direction in Rt lumbar musculature   AROM Eval 02/08/2024  Flexion WFL  Extension WFL  Right lateral flexion WFL   Left lateral flexion WFL  Right rotation [facility]  Left rotation WFL    (Blank rows = not tested)  LOWER EXTREMITY ROM:     Passive  Right Eval 02/08/2024 Left Eval 02/08/2024  Hip flexion [facility]  Hip extension    Hip abduction    Hip adduction    Hip internal rotation    Hip external rotation    Knee flexion    Knee extension    Ankle dorsiflexion    Ankle plantarflexion    Ankle inversion    Ankle eversion     (Blank rows =  not tested)  LOWER EXTREMITY MMT:    MMT Right Eval 02/08/2024 Left Eval 02/08/2024  Hip flexion 4+/5 4+/5  Hip extension 4/5 4+/5  Hip abduction 5/5 5/5  Hip adduction    Hip internal rotation    Hip external rotation    Knee flexion 5/5 5/5  Knee extension 5/5 5/5  Ankle dorsiflexion 5/5 5/5  Ankle plantarflexion    Ankle inversion    Ankle eversion     (Blank rows = not tested)  LUMBAR SPECIAL TESTS:  Straight leg raise test: Positive and Slump test: Positive FABER: no pain noted  FADIR: endorsing pain in Rt low back   FUNCTIONAL TESTS:    GAIT: Distance walked: not formally assessed  Assistive device utilized: None Level of assistance: supervision  Comments: no overt deviations noted   TREATMENT DATE:  02/08/2024 TherEx:  HEP handout provided with PT providing demonstration and explanation of each activity.  PT discussed dry needling and provided informational handout.   Self-Care:  POC, low back pain and sciatica relationship                                                                                                               PATIENT EDUCATION:  Education details: HEP, POC, dry needling Person educated: Patient Education method: Explanation, Demonstration, Tactile cues, Verbal cues, and Handouts Education comprehension: verbalized understanding, returned demonstration, verbal cues required, and tactile cues required  HOME EXERCISE PROGRAM: Access Code: [email or web address] URL:  [email or web address] Date: 02/08/2024 Prepared by: Susannah Daring  Exercises - Supine Lower Trunk Rotation  - 1 x daily - 7 x weekly - 2 sets - 10 reps - 5s hold - Supine Single Knee to Chest Stretch  - 1 x daily - 7 x weekly - 2 sets - 30s hold - Supine Double Knee to Chest  - 1 x daily - 7 x weekly - 2 sets - 30s hold - Supine Figure 4 Piriformis Stretch  - 1 x daily - 7 x weekly - 2 sets - 30s hold - Supine Bridge  - 1 x daily - 7 x weekly - 2 sets - 10 reps - 2-3s hold - Seated Hamstring Stretch  - 1 x daily - 7 x weekly - 2 sets - 30s hold - Seated Slump Nerve Glide  - 1 x daily - 7 x weekly - 3 sets - 10 reps  ASSESSMENT:  CLINICAL IMPRESSION: Patient is a 38 y.o. F who was seen today for physical therapy evaluation and treatment for chronic Rt low back pain with radiculopathy presenting with generalized strength deficits, functional mobility deficits, and high levels of pain. Patient is mainly limited secondary to high levels of pain. Patient will benefit from skilled PT to address above noted deficits.   OBJECTIVE IMPAIRMENTS: decreased activity tolerance, decreased coordination, decreased ROM, decreased strength, increased muscle spasms, and pain.   ACTIVITY LIMITATIONS: carrying, lifting, bending, sitting, standing, squatting, stairs, bed mobility, and caring for others  PARTICIPATION LIMITATIONS: cleaning, laundry, driving, community activity, and occupation  PERSONAL FACTORS:  Time since onset of injury/illness/exacerbation and 3+ comorbidities: anemia, anxiety, kidney disease are also affecting patient's functional outcome.   REHAB POTENTIAL: Good  CLINICAL DECISION MAKING: Stable/uncomplicated  EVALUATION COMPLEXITY: Low   GOALS: Goals reviewed with patient? Yes  SHORT TERM GOALS: Target date: 02/29/2024  Patient will show compliance with initial HEP. Baseline: Goal status: INITIAL  2.  Patient will report pain levels no greater than 6/10 in order  to show improved overall quality of life. Baseline:  Goal status: INITIAL   LONG TERM GOALS: Target date: 03/21/2024  Patient will be independent with final HEP in order to maintain and progress upon functional gains made within PT. Baseline:  Goal status: INITIAL  2.  Patient will report pain levels no greater than 4/10 in order to show improved overall quality of life. Baseline:  Goal status: INITIAL  3.  Patient will increase PSFS to at least 4.33 in order to show a significant improvement in subjective disability rating. Baseline:  Goal status: INITIAL  4.  Patient will increase bilat hip extension strength to at least 4+/5 in order to improve biomechanics with functional mobility. Baseline:  Goal status: INITIAL  5.  Patient will report ability to make it through one full day of work with no increase in pain. Baseline:  Goal status: INITIAL    PLAN:  PT FREQUENCY: 1x/week  PT DURATION: 6 weeks  PLANNED INTERVENTIONS: [id number]- PT Re-evaluation, 97750- Physical Performance Testing, 97110-Therapeutic exercises, 97530- Therapeutic activity, [id number]- Neuromuscular re-education, 97535- Self Care, [id number]- Manual therapy, [id number]- Gait training, [phone]- Orthotic Initial, [phone]- Orthotic/Prosthetic subsequent, [phone]- Canalith repositioning, [phone]- Aquatic Therapy, [phone]- Electrical stimulation (unattended), [phone]- Electrical stimulation (manual), [id number]- Vasopneumatic device, [id number]- Ultrasound, [id number]- Traction (mechanical), [id number]- Ionotophoresis 4mg /ml Dexamethasone , 79439 (1-2 muscles), [id number] (3+ muscles)- Dry Needling, Patient/Family education, Balance training, Stair training, Taping, Joint mobilization, Joint manipulation, Spinal manipulation, Spinal mobilization, Vestibular training, DME instructions, Cryotherapy, and Moist heat.  PLAN FOR NEXT SESSION: review HEP, dry needling, manual, generalized strengthening on LE, pain free lumbar ROM    Susannah Daring, PT, DPT 02/08/24 9:18 AM

## 2024-02-08 ENCOUNTER — Ambulatory Visit (INDEPENDENT_AMBULATORY_CARE_PROVIDER_SITE_OTHER): Payer: Self-pay

## 2024-02-08 ENCOUNTER — Encounter: Payer: Self-pay | Admitting: Hematology & Oncology

## 2024-02-08 DIAGNOSIS — M62838 Other muscle spasm: Secondary | ICD-10-CM

## 2024-02-08 DIAGNOSIS — M5416 Radiculopathy, lumbar region: Secondary | ICD-10-CM

## 2024-02-08 DIAGNOSIS — R2689 Other abnormalities of gait and mobility: Secondary | ICD-10-CM | POA: Diagnosis not present

## 2024-02-08 DIAGNOSIS — M5459 Other low back pain: Secondary | ICD-10-CM

## 2024-02-08 DIAGNOSIS — M6281 Muscle weakness (generalized): Secondary | ICD-10-CM

## 2024-02-14 ENCOUNTER — Encounter: Payer: Self-pay | Admitting: Radiology

## 2024-02-18 ENCOUNTER — Ambulatory Visit (INDEPENDENT_AMBULATORY_CARE_PROVIDER_SITE_OTHER): Admitting: Rehabilitative and Restorative Service Providers"

## 2024-02-18 ENCOUNTER — Encounter: Payer: Self-pay | Admitting: Rehabilitative and Restorative Service Providers"

## 2024-02-18 DIAGNOSIS — R2689 Other abnormalities of gait and mobility: Secondary | ICD-10-CM | POA: Diagnosis not present

## 2024-02-18 DIAGNOSIS — M62838 Other muscle spasm: Secondary | ICD-10-CM

## 2024-02-18 DIAGNOSIS — M5459 Other low back pain: Secondary | ICD-10-CM | POA: Diagnosis not present

## 2024-02-18 DIAGNOSIS — M5416 Radiculopathy, lumbar region: Secondary | ICD-10-CM | POA: Diagnosis not present

## 2024-02-18 DIAGNOSIS — M6281 Muscle weakness (generalized): Secondary | ICD-10-CM

## 2024-02-18 NOTE — Therapy (Signed)
 OUTPATIENT PHYSICAL THERAPY THORACOLUMBAR TREATMENT   Patient Name: Shelly [name] MRN: [id number] DOB:May 31, 1985, 38 y.o., female Today's Date: 02/18/2024  END OF SESSION:  PT End of Session - 02/18/24 0803     Visit Number 2    Number of Visits 6    Date for Recertification  03/21/24    PT Start Time 0802    PT Stop Time 0845    PT Time Calculation (min) 43 min    Activity Tolerance Patient tolerated treatment well;No increased pain    Behavior During Therapy [facility] for tasks assessed/performed           Past Medical History:  Diagnosis Date   Anemia    Anxiety    Chronic kidney disease    Endometriosis of pelvis 04/21/2012   Diagnosed on pathology after operative laparoscopy on 04/21/2012    PONV (postoperative nausea and vomiting)    Past Surgical History:  Procedure Laterality Date   BREAST ENHANCEMENT SURGERY     BREAST SURGERY     CESAREAN SECTION  09/17/00, 08/31/02, 06/14/08   x 3   CESAREAN SECTION N/A 11/29/2017   Procedure: Repeat CESAREAN SECTION;  Surgeon: Gorge Ade, MD;  Location: [facility];  Service: Obstetrics;  Laterality: N/A;  EDD: 12/05/17   CESAREAN SECTION N/A 11/06/2020   Procedure: Repeat CESAREAN SECTION;  Surgeon: Gorge Ade, MD;  Location: [facility];  Service: Obstetrics;  Laterality: N/A;  EDD: 11/10/20   CHROMOPERTUBATION  04/21/2012   Procedure: CHROMOPERTUBATION;  Surgeon: Gloris DELENA Hugger, MD;  Location: [facility];  Service: Gynecology;  Laterality: N/A;   LAPAROSCOPIC LYSIS OF ADHESIONS  04/21/2012   Procedure: LAPAROSCOPIC LYSIS OF ADHESIONS;  Surgeon: Gloris DELENA Hugger, MD;  Location: [facility];  Service: Gynecology;  Laterality: N/A;   LAPAROSCOPY  04/21/2012   Procedure: LAPAROSCOPY OPERATIVE;  Surgeon: Gloris DELENA Hugger, MD;  Location: [facility];  Service: Gynecology;  Laterality: N/A;  Peritoneal biopsies   Patient Active Problem List   Diagnosis Date Noted   Iron  deficiency 06/08/2023   Acute blood loss anemia (ABLA)  11/07/2020   Previous cesarean section 11/06/2020   Status post repeat low transverse cesarean section 7/27 11/06/2020   Postpartum care following cesarean delivery 7/27 11/06/2020   Anxiety 11/06/2020   History of anemia 11/06/2020   Previous cesarean delivery affecting pregnancy 11/06/2020    PCP: Tari ONEIDA Banter PA-C  REFERRING PROVIDER: Lonell Sprang, DO  REFERRING DIAG: [phone] (ICD-10-CM) - Chronic right-sided low back pain with right-sided sciatica M51.362 (ICD-10-CM) - Degeneration of intervertebral disc of lumbar region with discogenic back pain and lower extremity pain  Rationale for Evaluation and Treatment: Rehabilitation  THERAPY DIAG:  Radiculopathy, lumbar region  Other low back pain  Muscle weakness (generalized)  Other abnormalities of gait and mobility  Other muscle spasm  ONSET DATE: Summer 2025   SUBJECTIVE:  SUBJECTIVE STATEMENT: Samariyah reports she does a lot of sitting at her job.  This is one of the things that increases her radicular symptoms.  PERTINENT HISTORY:  Patient reports onset of low back pain after a busy day then had a child land of her legs causing them to increase Rt rotation of the back with trunk staying stationary that lead to immediate onset of pain. Patient endorses pain that can intermittently radiate to Rt ankle. Patient endorses spending a lot of time flat on the ground and pain can get bad enough to need PTO. Patient reports no previous injuries, trauma, or surgeries to the area or surrounding joints.  PAIN:  Are you having pain? Yes: NPRS scale: Can be 6-10/10  Pain location: Rt sided low back pain, Rt hip, as distal as the ankle  Pain description: Spasms, burning  Aggravating factors: sitting for prolonged periods of times, driving,  household chores, ADLs Relieving factors: lying flat on the floor  PRECAUTIONS: None  RED FLAGS: Bowel or bladder incontinence: No and Cauda equina syndrome: No   WEIGHT BEARING RESTRICTIONS: No  FALLS:  Has patient fallen in last 6 months? 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  DIAGNOSTIC FINDINGS:   X-rays demonstrate 5  nonrib-bearing lumbar vertebrae.  There is mild degenerative disc disease  at the L4-L5 level.  There is mild anterior listhesis of L5 on S1.  There  is no instability with flexion/extension views.   PATIENT SURVEYS:  PSFS: THE PATIENT SPECIFIC FUNCTIONAL SCALE  Place score of 0-10 (0 = unable to perform activity and 10 = able to perform activity at the same level as before injury or problem)  Activity Date: 02/08/2024    Household chores  2    2. Driving  4    3. Being more present  1    4.      Total Score 2.33      Total Score = Sum of activity scores/number of activities  Minimally Detectable Change: 3 points (for single activity); 2 points (for average score)  [employer] [facility] (nd). The Patient Specific Functional Scale . Retrieved from [email or web address]   COGNITION: Overall cognitive status: Within functional limits for tasks assessed     SENSATION: WFL  MUSCLE LENGTH: Not objectively measured, but tight   POSTURE: No Significant postural limitations  PALPATION: TTP in Rt lumbar musculature and Rt glute   LUMBAR ROM: painful in all direction in Rt lumbar musculature    AROM Eval 02/08/2024  Flexion WFL  Extension WFL  Right lateral flexion WFL  Left lateral flexion WFL  Right rotation [facility]  Left rotation WFL    (Blank rows = not tested)  LOWER EXTREMITY ROM:     Passive  Right  02/18/2024 Left  02/18/2024  Hip flexion 90 105  Hip extension    Hip abduction    Hip adduction    Hip internal rotation 15 14  Hip external rotation 36 46  Knee flexion    Knee extension    Ankle dorsiflexion    Ankle plantarflexion    Ankle inversion  Ankle eversion    Hamstrings 40 40   (Blank rows = not tested)  LOWER EXTREMITY MMT:    MMT Right Eval 02/08/2024 Left Eval 02/08/2024 02/18/2024  Hip flexion 4+/5 4+/5   Hip extension 4/5 4+/5   Hip abduction 5/5 5/5   Hip adduction     Hip internal rotation     Hip external rotation     Knee flexion 5/5 5/5   Knee extension 5/5 5/5   Ankle dorsiflexion 5/5 5/5   Ankle plantarflexion     Ankle inversion     Ankle eversion     Lumbar extension   10 seconds   (Blank rows = not tested)  LUMBAR SPECIAL TESTS:  Straight leg raise test: Positive and Slump test: Positive FABER: no pain noted  FADIR: endorsing pain in Rt low back   FUNCTIONAL TESTS:    GAIT: Distance walked: not formally assessed  Assistive device utilized: None Level of assistance: supervision  Comments: no overt deviations noted   TREATMENT DATE:  02/18/2024 Pelvic rotation 10 x 5 seconds Single knee to chest stretch with the other leg straight 4 x 15 seconds Supine hamstrings stretch with the other leg straight 4 x 15 seconds Figfure 4 stretch 4 x 15 seconds Lumbar extension AROM, hands low and hips forward within comfortable range 10 x 3 seconds Yoga Bridge 2 sets of 10 for 5 seconds Prone alternating hip extension 2 sets of 10 for 3 seconds  Functional Activities: Practical logroll for bed mobility; discussed the importance of frequent changes of position at work; discussed the importance of avoiding slouched and  flexed postures; discussed home walking prescription of 3 times a week for 20+ minutes; discussed spine anatomy with the model and mechanism of reducing a lumbar HNP; reviewed and made appropriate modifications to her day 1 home exercises   02/08/2024 TherEx:  HEP handout provided with PT providing demonstration and explanation of each activity.  PT discussed dry needling and provided informational handout.   Self-Care:  POC, low back pain and sciatica relationship                                                                                                              PATIENT EDUCATION:  Education details: HEP, POC, dry needling Person educated: Patient Education method: Explanation, Demonstration, Tactile cues, Verbal cues, and Handouts Education comprehension: verbalized understanding, returned demonstration, verbal cues required, and tactile cues required  HOME EXERCISE PROGRAM: Access Code: [email or web address] URL: [email or web address] Date: 02/18/2024 Prepared by: Lamar Ivory  Exercises - Supine Single Knee to Chest Stretch  - 1 x daily - 7 x weekly - 1 sets - 4-5 reps - 15 seconds hold - Supine Figure 4 Piriformis Stretch  - 1 x daily - 7 x weekly - 1 sets - 4-5 reps - 15 seconds hold - Supine Bridge  - 1-2 x daily - 7 x weekly - 2 sets - 10 reps - 5 seconds hold - Standing Lumbar Extension at Wall - Forearms  - 5 x daily -  7 x weekly - 1 sets - 5 reps - 3 seconds hold - Supine Hamstrings Stretch  - 1-2 x daily - 7 x weekly - 1 sets - 4-5 reps - 15 seconds hold - Prone Hip Extension  - 1-2 x daily - 7 x weekly - 2-3 sets - 10 reps - 3 seconds hold  ASSESSMENT:  CLINICAL IMPRESSION: Venissa had a positive straight leg raise test on the right side.  We modified some activities today to avoid flexion and emphasize extension to reduce a likely lumbar HNP.  She will benefit from continued posture and body mechanics education, appropriate flexibility and a low back  strengthening to meet the below listed goals.   Patient is a 38 y.o. F who was seen today for physical therapy evaluation and treatment for chronic Rt low back pain with radiculopathy presenting with generalized strength deficits, functional mobility deficits, and high levels of pain. Patient is mainly limited secondary to high levels of pain. Patient will benefit from skilled PT to address above noted deficits.   OBJECTIVE IMPAIRMENTS: decreased activity tolerance, decreased coordination, decreased ROM, decreased strength, increased muscle spasms, and pain.   ACTIVITY LIMITATIONS: carrying, lifting, bending, sitting, standing, squatting, stairs, bed mobility, and caring for others  PARTICIPATION LIMITATIONS: cleaning, laundry, driving, community activity, and occupation  PERSONAL FACTORS: Time since onset of injury/illness/exacerbation and 3+ comorbidities: anemia, anxiety, kidney disease are also affecting patient's functional outcome.   REHAB POTENTIAL: Good  CLINICAL DECISION MAKING: Stable/uncomplicated  EVALUATION COMPLEXITY: Low   GOALS: Goals reviewed with patient? Yes  SHORT TERM GOALS: Target date: 02/29/2024  Patient will show compliance with initial HEP. Baseline: Goal status: Ongoing 02/18/2024  2.  Patient will report pain levels no greater than 6/10 in order to show improved overall quality of life. Baseline:  Goal status: Ongoing 02/18/2024   LONG TERM GOALS: Target date: 03/21/2024  Patient will be independent with final HEP in order to maintain and progress upon functional gains made within PT. Baseline:  Goal status: INITIAL  2.  Patient will report pain levels no greater than 4/10 in order to show improved overall quality of life. Baseline:  Goal status: INITIAL  3.  Patient will increase PSFS to at least 4.33 in order to show a significant improvement in subjective disability rating. Baseline:  Goal status: INITIAL  4.  Patient will increase bilat hip  extension strength to at least 4+/5 in order to improve biomechanics with functional mobility. Baseline:  Goal status: INITIAL  5.  Patient will report ability to make it through one full day of work with no increase in pain. Baseline:  Goal status: INITIAL    PLAN:  PT FREQUENCY: 1x/week  PT DURATION: 6 weeks  PLANNED INTERVENTIONS: [id number]- PT Re-evaluation, 97750- Physical Performance Testing, 97110-Therapeutic exercises, 97530- Therapeutic activity, [id number]- Neuromuscular re-education, 97535- Self Care, [id number]- Manual therapy, [id number]- Gait training, [phone]- Orthotic Initial, [id number]- Orthotic/Prosthetic subsequent, [phone]- Canalith repositioning, [phone]- Aquatic Therapy, [phone]- Electrical stimulation (unattended), [phone]- Electrical stimulation (manual), [id number]- Vasopneumatic device, [id number]- Ultrasound, [id number]- Traction (mechanical), [id number]- Ionotophoresis 4mg /ml Dexamethasone , 79439 (1-2 muscles), 20561 (3+ muscles)- Dry Needling, Patient/Family education, Balance training, Stair training, Taping, Joint mobilization, Joint manipulation, Spinal manipulation, Spinal mobilization, Vestibular training, DME instructions, Cryotherapy, and Moist heat.  PLAN FOR NEXT SESSION: Extension emphasis secondary to positive straight leg raise test and likely lumbar HNP.  Posture and body mechanics work along with low back strengthening to meet long-term goals.   Myer LELON Ivory, PT, MPT 02/18/24 4:32 PM

## 2024-02-25 ENCOUNTER — Ambulatory Visit: Admitting: Rehabilitative and Restorative Service Providers"

## 2024-02-25 ENCOUNTER — Encounter: Payer: Self-pay | Admitting: Rehabilitative and Restorative Service Providers"

## 2024-02-25 DIAGNOSIS — M62838 Other muscle spasm: Secondary | ICD-10-CM

## 2024-02-25 DIAGNOSIS — R2689 Other abnormalities of gait and mobility: Secondary | ICD-10-CM | POA: Diagnosis not present

## 2024-02-25 DIAGNOSIS — M5459 Other low back pain: Secondary | ICD-10-CM

## 2024-02-25 DIAGNOSIS — M6281 Muscle weakness (generalized): Secondary | ICD-10-CM

## 2024-02-25 DIAGNOSIS — M5416 Radiculopathy, lumbar region: Secondary | ICD-10-CM

## 2024-02-25 NOTE — Therapy (Signed)
 OUTPATIENT PHYSICAL THERAPY THORACOLUMBAR TREATMENT   Patient Name: MEHEK GREGA MRN: [id number] DOB:October 23, 1985, 38 y.o., female Today's Date: 02/25/2024  END OF SESSION:  PT End of Session - 02/25/24 0803     Visit Number 3    Number of Visits 6    Date for Recertification  03/21/24    PT Start Time 0802    PT Stop Time 0845    PT Time Calculation (min) 43 min    Activity Tolerance Patient tolerated treatment well;No increased pain    Behavior During Therapy [facility] for tasks assessed/performed            Past Medical History:  Diagnosis Date   Anemia    Anxiety    Chronic kidney disease    Endometriosis of pelvis 04/21/2012   Diagnosed on pathology after operative laparoscopy on 04/21/2012    PONV (postoperative nausea and vomiting)    Past Surgical History:  Procedure Laterality Date   BREAST ENHANCEMENT SURGERY     BREAST SURGERY     CESAREAN SECTION  09/17/00, 08/31/02, 06/14/08   x 3   CESAREAN SECTION N/A 11/29/2017   Procedure: Repeat CESAREAN SECTION;  Surgeon: Gorge Ade, MD;  Location: [facility];  Service: Obstetrics;  Laterality: N/A;  EDD: 12/05/17   CESAREAN SECTION N/A 11/06/2020   Procedure: Repeat CESAREAN SECTION;  Surgeon: Gorge Ade, MD;  Location: [facility];  Service: Obstetrics;  Laterality: N/A;  EDD: 11/10/20   CHROMOPERTUBATION  04/21/2012   Procedure: CHROMOPERTUBATION;  Surgeon: Gloris DELENA Hugger, MD;  Location: [facility];  Service: Gynecology;  Laterality: N/A;   LAPAROSCOPIC LYSIS OF ADHESIONS  04/21/2012   Procedure: LAPAROSCOPIC LYSIS OF ADHESIONS;  Surgeon: Gloris DELENA Hugger, MD;  Location: [facility];  Service: Gynecology;  Laterality: N/A;   LAPAROSCOPY  04/21/2012   Procedure: LAPAROSCOPY OPERATIVE;  Surgeon: Gloris DELENA Hugger, MD;  Location: [facility];  Service: Gynecology;  Laterality: N/A;  Peritoneal biopsies   Patient Active Problem List   Diagnosis Date Noted   Iron  deficiency 06/08/2023   Acute blood loss anemia (ABLA)  11/07/2020   Previous cesarean section 11/06/2020   Status post repeat low transverse cesarean section 7/27 11/06/2020   Postpartum care following cesarean delivery 7/27 11/06/2020   Anxiety 11/06/2020   History of anemia 11/06/2020   Previous cesarean delivery affecting pregnancy 11/06/2020    PCP: Tari ONEIDA Banter PA-C  REFERRING PROVIDER: Lonell Sprang, DO  REFERRING DIAG: [phone] (ICD-10-CM) - Chronic right-sided low back pain with right-sided sciatica M51.362 (ICD-10-CM) - Degeneration of intervertebral disc of lumbar region with discogenic back pain and lower extremity pain  Rationale for Evaluation and Treatment: Rehabilitation  THERAPY DIAG:  Radiculopathy, lumbar region  Other low back pain  Muscle weakness (generalized)  Other abnormalities of gait and mobility  Other muscle spasm  ONSET DATE: Summer 2025   SUBJECTIVE:  SUBJECTIVE STATEMENT: Annisha reports poor HEP compliance over the past week due to work complications and preparing to close on a new home.  PERTINENT HISTORY:  Patient reports onset of low back pain after a busy day then had a child land of her legs causing them to increase Rt rotation of the back with trunk staying stationary that lead to immediate onset of pain. Patient endorses pain that can intermittently radiate to Rt ankle. Patient endorses spending a lot of time flat on the ground and pain can get bad enough to need PTO. Patient reports no previous injuries, trauma, or surgeries to the area or surrounding joints.  PAIN:  Are you having pain? Yes: NPRS scale: Can still be 6-10/10  Pain location: Rt sided low back pain, Rt hip, as distal as the ankle  Pain description: Spasms, burning  Aggravating factors: sitting for prolonged periods of times, driving,  household chores, ADLs Relieving factors: lying flat on the floor, lumbar extension  PRECAUTIONS: None  RED FLAGS: Bowel or bladder incontinence: No and Cauda equina syndrome: No   WEIGHT BEARING RESTRICTIONS: No  FALLS:  Has patient fallen in last 6 months? 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  DIAGNOSTIC FINDINGS:   X-rays demonstrate 5  nonrib-bearing lumbar vertebrae.  There is mild degenerative disc disease  at the L4-L5 level.  There is mild anterior listhesis of L5 on S1.  There  is no instability with flexion/extension views.   PATIENT SURVEYS:  PSFS: THE PATIENT SPECIFIC FUNCTIONAL SCALE  Place score of 0-10 (0 = unable to perform activity and 10 = able to perform activity at the same level as before injury or problem)  Activity Date: 02/08/2024    Household chores  2    2. Driving  4    3. Being more present  1    4.      Total Score 2.33      Total Score = Sum of activity scores/number of activities  Minimally Detectable Change: 3 points (for single activity); 2 points (for average score)  [facility] (nd). The Patient Specific Functional Scale . Retrieved from [email or web address]   COGNITION: Overall cognitive status: Within functional limits for tasks assessed     SENSATION: WFL  MUSCLE LENGTH: Not objectively measured, but tight   POSTURE: No Significant postural limitations  PALPATION: TTP in Rt lumbar musculature and Rt glute   LUMBAR ROM: painful in all direction in Rt  lumbar musculature   AROM Eval 02/08/2024  Flexion WFL  Extension WFL  Right lateral flexion WFL  Left lateral flexion WFL  Right rotation [facility]  Left rotation WFL    (Blank rows = not tested)  LOWER EXTREMITY ROM:     Passive  Right  02/18/2024 Left  02/18/2024 Left/Right 02/25/2024  Hip flexion 90 105 110/110  Hip extension     Hip abduction     Hip adduction     Hip internal rotation 15 14 20/19  Hip external rotation 36 46 42/41  Knee flexion     Knee extension     Ankle dorsiflexion  Ankle plantarflexion     Ankle inversion     Ankle eversion     Hamstrings 40 40 50/50   (Blank rows = not tested)  LOWER EXTREMITY MMT:    MMT Right Eval 02/08/2024 Left Eval 02/08/2024 02/18/2024  Hip flexion 4+/5 4+/5   Hip extension 4/5 4+/5   Hip abduction 5/5 5/5   Hip adduction     Hip internal rotation     Hip external rotation     Knee flexion 5/5 5/5   Knee extension 5/5 5/5   Ankle dorsiflexion 5/5 5/5   Ankle plantarflexion     Ankle inversion     Ankle eversion     Lumbar extension   10 seconds   (Blank rows = not tested)  LUMBAR SPECIAL TESTS:  Straight leg raise test: Positive and Slump test: Positive FABER: no pain noted  FADIR: endorsing pain in Rt low back   FUNCTIONAL TESTS:    GAIT: Distance walked: not formally assessed  Assistive device utilized: None Level of assistance: supervision  Comments: no overt deviations noted   TREATMENT DATE:  02/25/2024 Single knee to chest stretch with the other leg straight 5 x 15 seconds Supine hamstrings stretch with the other leg straight 5 x 15 seconds Figfure 4 stretch 5 x 15 seconds Lumbar extension AROM, hands low and hips forward within comfortable range 10 x 3 seconds Yoga Bridge 10 for 5 seconds Prone alternating hip extension 2 sets of 10 for 3 seconds  Functional Activities: Hip hike in door frame 2 sets of 5 x 3 seconds (postural correction/endurance); review of correct body mechanics for  bed mobility including logroll; reminders to use a lumbar roll when seated and reclined when possible; reminders to change position frequently and spread out lumbar extension throughout the day; reminder to prioritize home exercise program and home walking participation to meet long-term goals   02/18/2024 Pelvic rotation 10 x 5 seconds Single knee to chest stretch with the other leg straight 4 x 15 seconds Supine hamstrings stretch with the other leg straight 4 x 15 seconds Figfure 4 stretch 4 x 15 seconds Lumbar extension AROM, hands low and hips forward within comfortable range 10 x 3 seconds Yoga Bridge 2 sets of 10 for 5 seconds Prone alternating hip extension 2 sets of 10 for 3 seconds  Functional Activities: Practical logroll for bed mobility; discussed the importance of frequent changes of position at work; discussed the importance of avoiding slouched and flexed postures; discussed home walking prescription of 3 times a week for 20+ minutes; discussed spine anatomy with the model and mechanism of reducing a lumbar HNP; reviewed and made appropriate modifications to her day 1 home exercises   02/08/2024 TherEx:  HEP handout provided with PT providing demonstration and explanation of each activity.  PT discussed dry needling and provided informational handout.   Self-Care:  POC, low back pain and sciatica relationship  PATIENT EDUCATION:  Education details: HEP, POC, dry needling Person educated: Patient Education method: Explanation, Demonstration, Tactile cues, Verbal cues, and Handouts Education comprehension: verbalized understanding, returned demonstration, verbal cues required, and tactile cues required  HOME EXERCISE PROGRAM: Access Code: [email or web address] URL: [email or web address] Date: 02/25/2024 Prepared by: Lamar Ivory  Exercises - Supine Lower  Trunk Rotation  - 1 x daily - 1 x weekly - 2 sets - 10 reps - 5s hold - Supine Single Knee to Chest Stretch  - 1 x daily - 1 x weekly - 1 sets - 4-5 reps - 15 seconds hold - Supine Double Knee to Chest  - 1 x daily - 1 x weekly - 2 sets - 30s hold - Supine Figure 4 Piriformis Stretch  - 1 x daily - 1 x weekly - 1 sets - 4-5 reps - 15 seconds hold - Supine Bridge  - 1-2 x daily - 1 x weekly - 2 sets - 10 reps - 5 seconds hold - Seated Slump Nerve Glide  - 1 x daily - 1 x weekly - 3 sets - 10 reps - Standing Lumbar Extension at Wall - Forearms  - 5 x daily - 7 x weekly - 1 sets - 5 reps - 3 seconds hold - Supine Hamstring Stretch  - 1-2 x daily - 7 x weekly - 1 sets - 4-5 reps - 15 seconds hold - Prone Hip Extension  - 1-2 x daily - 7 x weekly - 2-3 sets - 10 reps - 3 seconds hold - Standing Hip Hiking  - 5 x daily - 7 x weekly - 1 sets - 5-10 reps - 3 seconds hold  ASSESSMENT:  CLINICAL IMPRESSION: Remell had a difficult time with home exercise program participation secondary to major changes at work and closing on a new house.  She realizes progress with her physical therapy will be much quicker if she is able to complete her home exercises at least 1 time per day.  Even with less than ideal home program participation, initially reports pain closer attention to her posture and body mechanics.  She also reports taking breaks during the day to change position and do some standing stretches.  She will benefit from continued posture and body mechanics education, appropriate flexibility and a low back strengthening to meet the below listed goals.   Patient is a 38 y.o. F who was seen today for physical therapy evaluation and treatment for chronic Rt low back pain with radiculopathy presenting with generalized strength deficits, functional mobility deficits, and high levels of pain. Patient is mainly limited secondary to high levels of pain. Patient will benefit from skilled PT to address above noted  deficits.   OBJECTIVE IMPAIRMENTS: decreased activity tolerance, decreased coordination, decreased ROM, decreased strength, increased muscle spasms, and pain.   ACTIVITY LIMITATIONS: carrying, lifting, bending, sitting, standing, squatting, stairs, bed mobility, and caring for others  PARTICIPATION LIMITATIONS: cleaning, laundry, driving, community activity, and occupation  PERSONAL FACTORS: Time since onset of injury/illness/exacerbation and 3+ comorbidities: anemia, anxiety, kidney disease are also affecting patient's functional outcome.   REHAB POTENTIAL: Good  CLINICAL DECISION MAKING: Stable/uncomplicated  EVALUATION COMPLEXITY: Low   GOALS: Goals reviewed with patient? Yes  SHORT TERM GOALS: Target date: 02/29/2024  Patient will show compliance with initial HEP. Baseline: Goal status: Ongoing 02/25/2024  2.  Patient will report pain levels no greater than 6/10 in order to show improved overall quality of life. Baseline:  Goal status: Ongoing 02/25/2024   LONG  TERM GOALS: Target date: 03/21/2024  Patient will be independent with final HEP in order to maintain and progress upon functional gains made within PT. Baseline:  Goal status: INITIAL  2.  Patient will report pain levels no greater than 4/10 in order to show improved overall quality of life. Baseline:  Goal status: INITIAL  3.  Patient will increase PSFS to at least 4.33 in order to show a significant improvement in subjective disability rating. Baseline:  Goal status: INITIAL  4.  Patient will increase bilat hip extension strength to at least 4+/5 in order to improve biomechanics with functional mobility. Baseline:  Goal status: INITIAL  5.  Patient will report ability to make it through one full day of work with no increase in pain. Baseline:  Goal status: INITIAL    PLAN:  PT FREQUENCY: 1x/week  PT DURATION: 6 weeks  PLANNED INTERVENTIONS: 97164- PT Re-evaluation, 97750- Physical Performance  Testing, 97110-Therapeutic exercises, 97530- Therapeutic activity, [id number]- Neuromuscular re-education, 97535- Self Care, [id number]- Manual therapy, [id number]- Gait training, [phone]- Orthotic Initial, [phone]- Orthotic/Prosthetic subsequent, [phone]- Canalith repositioning, [phone]- Aquatic Therapy, [phone]- Electrical stimulation (unattended), [phone]- Electrical stimulation (manual), [id number]- Vasopneumatic device, [id number]- Ultrasound, [id number]- Traction (mechanical), [id number]- Ionotophoresis 4mg /ml Dexamethasone , [id number] (1-2 muscles), [id number] (3+ muscles)- Dry Needling, Patient/Family education, Balance training, Stair training, Taping, Joint mobilization, Joint manipulation, Spinal manipulation, Spinal mobilization, Vestibular training, DME instructions, Cryotherapy, and Moist heat.  PLAN FOR NEXT SESSION: Extension emphasis secondary to positive straight leg raise test and likely lumbar HNP.  Posture and body mechanics work along with low back strengthening to meet long-term goals.  Update pain assessment and spine strength test.   Myer LELON Ivory, PT, MPT 02/25/24 2:56 PM

## 2024-03-03 ENCOUNTER — Encounter: Payer: Self-pay | Admitting: Rehabilitative and Restorative Service Providers"

## 2024-03-03 ENCOUNTER — Ambulatory Visit: Admitting: Rehabilitative and Restorative Service Providers"

## 2024-03-03 DIAGNOSIS — M6281 Muscle weakness (generalized): Secondary | ICD-10-CM

## 2024-03-03 DIAGNOSIS — M5416 Radiculopathy, lumbar region: Secondary | ICD-10-CM | POA: Diagnosis not present

## 2024-03-03 DIAGNOSIS — M5459 Other low back pain: Secondary | ICD-10-CM | POA: Diagnosis not present

## 2024-03-03 DIAGNOSIS — R2689 Other abnormalities of gait and mobility: Secondary | ICD-10-CM | POA: Diagnosis not present

## 2024-03-03 DIAGNOSIS — M62838 Other muscle spasm: Secondary | ICD-10-CM

## 2024-03-03 NOTE — Therapy (Signed)
 OUTPATIENT PHYSICAL THERAPY THORACOLUMBAR TREATMENT   Patient Name: Shelly [name] MRN: [id number] DOB:1985/08/17, 38 y.o., female Today's Date: 03/03/2024  END OF SESSION:  PT End of Session - 03/03/24 0805     Visit Number 4    Number of Visits 6    Date for Recertification  03/21/24    PT Start Time 0804    PT Stop Time 0845    PT Time Calculation (min) 41 min    Activity Tolerance Patient tolerated treatment well;No increased pain    Behavior During Therapy [facility] for tasks assessed/performed             Past Medical History:  Diagnosis Date   Anemia    Anxiety    Chronic kidney disease    Endometriosis of pelvis 04/21/2012   Diagnosed on pathology after operative laparoscopy on 04/21/2012    PONV (postoperative nausea and vomiting)    Past Surgical History:  Procedure Laterality Date   BREAST ENHANCEMENT SURGERY     BREAST SURGERY     CESAREAN SECTION  09/17/00, 08/31/02, 06/14/08   x 3   CESAREAN SECTION N/A 11/29/2017   Procedure: Repeat CESAREAN SECTION;  Surgeon: Shelly Ade, MD;  Location: [facility];  Service: Obstetrics;  Laterality: N/A;  EDD: 12/05/17   CESAREAN SECTION N/A 11/06/2020   Procedure: Repeat CESAREAN SECTION;  Surgeon: Shelly Ade, MD;  Location: [facility];  Service: Obstetrics;  Laterality: N/A;  EDD: 11/10/20   CHROMOPERTUBATION  04/21/2012   Procedure: CHROMOPERTUBATION;  Surgeon: Shelly DELENA Hugger, MD;  Location: [facility];  Service: Gynecology;  Laterality: N/A;   LAPAROSCOPIC LYSIS OF ADHESIONS  04/21/2012   Procedure: LAPAROSCOPIC LYSIS OF ADHESIONS;  Surgeon: Shelly DELENA Hugger, MD;  Location: [facility];  Service: Gynecology;  Laterality: N/A;   LAPAROSCOPY  04/21/2012   Procedure: LAPAROSCOPY OPERATIVE;  Surgeon: Shelly DELENA Hugger, MD;  Location: [facility];  Service: Gynecology;  Laterality: N/A;  Peritoneal biopsies   Patient Active Problem List   Diagnosis Date Noted   Iron  deficiency 06/08/2023   Acute blood loss anemia (ABLA)  11/07/2020   Previous cesarean section 11/06/2020   Status post repeat low transverse cesarean section 7/27 11/06/2020   Postpartum care following cesarean delivery 7/27 11/06/2020   Anxiety 11/06/2020   History of anemia 11/06/2020   Previous cesarean delivery affecting pregnancy 11/06/2020    PCP: Shelly ONEIDA Banter PA-C  REFERRING PROVIDER: Lonell Sprang, DO  REFERRING DIAG: [phone] (ICD-10-CM) - Chronic right-sided low back pain with right-sided sciatica M51.362 (ICD-10-CM) - Degeneration of intervertebral disc of lumbar region with discogenic back pain and lower extremity pain  Rationale for Evaluation and Treatment: Rehabilitation  THERAPY DIAG:  Radiculopathy, lumbar region  Other low back pain  Muscle weakness (generalized)  Other abnormalities of gait and mobility  Other muscle spasm  ONSET DATE: Summer 2025   SUBJECTIVE:  SUBJECTIVE STATEMENT: Shelly [name] continues to struggle with her home exercise program compliance.  She notes she has a lot going on at work as well as trying to close on a house with multiple problems.  Despite this, I encouraged her to increase her home exercise program compliance as she is unlikely to meet long-term goals with her current level of participation.  PERTINENT HISTORY:  Patient reports onset of low back pain after a busy day then had a child land of her legs causing them to increase Rt rotation of the back with trunk staying stationary that lead to immediate onset of pain. Patient endorses pain that can intermittently radiate to Rt ankle. Patient endorses spending a lot of time flat on the ground and pain can get bad enough to need PTO. Patient reports no previous injuries, trauma, or surgeries to the area or surrounding joints.  PAIN:  Are you having  pain? Yes: NPRS scale: Can still be 3-5.5/10 (was 6-10/10)  Pain location: Rt sided low back pain, Rt hip, as distal as the ankle  Pain description: Spasms, burning  Aggravating factors: sitting for prolonged periods of times, driving, household chores, ADLs Relieving factors: lying flat on the floor, lumbar extension  PRECAUTIONS: None  RED FLAGS: Bowel or bladder incontinence: No and Cauda equina syndrome: No   WEIGHT BEARING RESTRICTIONS: No  FALLS:  Has patient fallen in last 6 months? 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  DIAGNOSTIC FINDINGS:   X-rays demonstrate 5  nonrib-bearing lumbar vertebrae.  There is mild degenerative disc disease  at the L4-L5 level.  There is mild anterior listhesis of L5 on S1.  There  is no instability with flexion/extension views.   PATIENT SURVEYS:  PSFS: THE PATIENT SPECIFIC FUNCTIONAL SCALE  Place score of 0-10 (0 = unable to perform activity and 10 = able to perform activity at the same level as before injury or problem)  Activity Date: 02/08/2024    Household chores  2    2. Driving  4    3. Being more present  1    4.      Total Score 2.33      Total Score = Sum of activity scores/number of activities  Minimally Detectable Change: 3 points (for single activity); 2 points (for average score)  [name] (nd). The Patient Specific Functional Scale . Retrieved from [email or web address]   COGNITION: Overall cognitive status: Within functional limits for tasks  assessed     SENSATION: WFL  MUSCLE LENGTH: Not objectively measured, but tight   POSTURE: No Significant postural limitations  PALPATION: TTP in Rt lumbar musculature and Rt glute   LUMBAR ROM: painful in all direction in Rt lumbar musculature   AROM Eval 02/08/2024  Flexion WFL  Extension WFL  Right lateral flexion WFL  Left lateral flexion WFL  Right rotation [facility]  Left rotation WFL    (Blank rows = not tested)  LOWER EXTREMITY ROM:     Passive  Right  02/18/2024 Left  02/18/2024 Left/Right 02/25/2024  Hip flexion 90 105 110/110  Hip extension  Hip abduction     Hip adduction     Hip internal rotation 15 14 20/19  Hip external rotation 36 46 42/41  Knee flexion     Knee extension     Ankle dorsiflexion     Ankle plantarflexion     Ankle inversion     Ankle eversion     Hamstrings 40 40 50/50   (Blank rows = not tested)  LOWER EXTREMITY MMT:    MMT Right Eval 02/08/2024 Left Eval 02/08/2024 02/18/2024  Hip flexion 4+/5 4+/5   Hip extension 4/5 4+/5   Hip abduction 5/5 5/5   Hip adduction     Hip internal rotation     Hip external rotation     Knee flexion 5/5 5/5   Knee extension 5/5 5/5   Ankle dorsiflexion 5/5 5/5   Ankle plantarflexion     Ankle inversion     Ankle eversion     Lumbar extension   10 seconds   (Blank rows = not tested)  LUMBAR SPECIAL TESTS:  Straight leg raise test: Positive and Slump test: Positive FABER: no pain noted  FADIR: endorsing pain in Rt low back   FUNCTIONAL TESTS:    GAIT: Distance walked: not formally assessed  Assistive device utilized: None Level of assistance: supervision  Comments: no overt deviations noted   TREATMENT DATE:  03/03/2024 Single knee to chest stretch with the other leg straight 4 x 15 seconds Supine hamstrings stretch with the other leg straight 4 x 15 seconds Figfure 4 stretch 4 x 15 seconds Lumbar extension AROM, hands low and hips forward within comfortable range 10 x 3  seconds Yoga Bridge 10 for 5 seconds Prone alternating hip extension 2 sets of 10 for 5 seconds Hip abduction 2 sets of 10 for 3 seconds in side-lie  Functional Activities: Hip hike in door frame 2 sets of 5 x 3 seconds (postural correction/endurance, needs feedback to avoid hyperextension of the stance leg) Pull to chest/Rows Blue Thera-Band 20 x 3 seconds (posture) Quick review of correct body mechanics and reminder to prioritize home exercise program and home walking participation to meet long-term goals    02/25/2024 Single knee to chest stretch with the other leg straight 4 x 15 seconds Supine hamstrings stretch with the other leg straight 4 x 15 seconds Figfure 4 stretch 4 x 15 seconds Lumbar extension AROM, hands low and hips forward within comfortable range 10 x 3 seconds Yoga Bridge 10 for 5 seconds Prone alternating hip extension 2 sets of 10 for 5 seconds  Functional Activities: Hip hike in door frame 2 sets of 5 x 3 seconds (postural correction/endurance); review of correct body mechanics for bed mobility including logroll; reminders to use a lumbar roll when seated and reclined when possible; reminders to change position frequently and spread out lumbar extension throughout the day; reminder to prioritize home exercise program and home walking participation to meet long-term goals   02/18/2024 Pelvic rotation 10 x 5 seconds Single knee to chest stretch with the other leg straight 4 x 15 seconds Supine hamstrings stretch with the other leg straight 4 x 15 seconds Figfure 4 stretch 4 x 15 seconds Lumbar extension AROM, hands low and hips forward within comfortable range 10 x 3 seconds Yoga Bridge 2 sets of 10 for 5 seconds Prone alternating hip extension 2 sets of 10 for 3 seconds  Functional Activities: Practical logroll for bed mobility; discussed the importance of frequent changes of position at work;  discussed the importance of avoiding slouched and flexed postures;  discussed home walking prescription of 3 times a week for 20+ minutes; discussed spine anatomy with the model and mechanism of reducing a lumbar HNP; reviewed and made appropriate modifications to her day 1 home exercises   PATIENT EDUCATION:  Education details: HEP, POC, dry needling Person educated: Patient Education method: Explanation, Demonstration, Tactile cues, Verbal cues, and Handouts Education comprehension: verbalized understanding, returned demonstration, verbal cues required, and tactile cues required  HOME EXERCISE PROGRAM: Access Code: [email or web address] URL: [email or web address] Date: 02/25/2024 Prepared by: Lamar Ivory  Exercises - Supine Lower Trunk Rotation  - 1 x daily - 1 x weekly - 2 sets - 10 reps - 5s hold - Supine Single Knee to Chest Stretch  - 1 x daily - 1 x weekly - 1 sets - 4-5 reps - 15 seconds hold - Supine Double Knee to Chest  - 1 x daily - 1 x weekly - 2 sets - 30s hold - Supine Figure 4 Piriformis Stretch  - 1 x daily - 1 x weekly - 1 sets - 4-5 reps - 15 seconds hold - Supine Bridge  - 1-2 x daily - 1 x weekly - 2 sets - 10 reps - 5 seconds hold - Seated Slump Nerve Glide  - 1 x daily - 1 x weekly - 3 sets - 10 reps - Standing Lumbar Extension at Wall - Forearms  - 5 x daily - 7 x weekly - 1 sets - 5 reps - 3 seconds hold - Supine Hamstring Stretch  - 1-2 x daily - 7 x weekly - 1 sets - 4-5 reps - 15 seconds hold - Prone Hip Extension  - 1-2 x daily - 7 x weekly - 2-3 sets - 10 reps - 3 seconds hold - Standing Hip Hiking  - 5 x daily - 7 x weekly - 1 sets - 5-10 reps - 3 seconds hold  ASSESSMENT:  CLINICAL IMPRESSION: Mi does note periods of time during the day where she is not in as much pain.  Typically, this is when she is changing positions and participating in her home exercises.  Because of several things going on at work and at home, her home exercise program participation has not been as good as recommended.  If Micheale can increase  her home exercise program participation, I am confident she will be able to reduce her large L5-S1 disc and meet long-term goals established at evaluation.  With her current participation though, she will probably need an extension to her plan of care.  Patient is a 38 y.o. F who was seen today for physical therapy evaluation and treatment for chronic Rt low back pain with radiculopathy presenting with generalized strength deficits, functional mobility deficits, and high levels of pain. Patient is mainly limited secondary to high levels of pain. Patient will benefit from skilled PT to address above noted deficits.   OBJECTIVE IMPAIRMENTS: decreased activity tolerance, decreased coordination, decreased ROM, decreased strength, increased muscle spasms, and pain.   ACTIVITY LIMITATIONS: carrying, lifting, bending, sitting, standing, squatting, stairs, bed mobility, and caring for others  PARTICIPATION LIMITATIONS: cleaning, laundry, driving, community activity, and occupation  PERSONAL FACTORS: Time since onset of injury/illness/exacerbation and 3+ comorbidities: anemia, anxiety, kidney disease are also affecting patient's functional outcome.   REHAB POTENTIAL: Good  CLINICAL DECISION MAKING: Stable/uncomplicated  EVALUATION COMPLEXITY: Low   GOALS: Goals reviewed with patient? Yes  SHORT TERM GOALS: Target date: 02/29/2024  Patient will show  compliance with initial HEP. Baseline: Goal status: Ongoing 03/03/2024  2.  Patient will report pain levels no greater than 6/10 in order to show improved overall quality of life. Baseline:  Goal status: Partially met 03/03/2024   LONG TERM GOALS: Target date: 03/21/2024  Patient will be independent with final HEP in order to maintain and progress upon functional gains made within PT. Baseline:  Goal status: INITIAL  2.  Patient will report pain levels no greater than 4/10 in order to show improved overall quality of life. Baseline:  Goal  status: INITIAL  3.  Patient will increase PSFS to at least 4.33 in order to show a significant improvement in subjective disability rating. Baseline:  Goal status: INITIAL  4.  Patient will increase bilat hip extension strength to at least 4+/5 in order to improve biomechanics with functional mobility. Baseline:  Goal status: INITIAL  5.  Patient will report ability to make it through one full day of work with no increase in pain. Baseline:  Goal status: INITIAL    PLAN:  PT FREQUENCY: 1x/week  PT DURATION: 6 weeks  PLANNED INTERVENTIONS: 97164- PT Re-evaluation, 97750- Physical Performance Testing, 97110-Therapeutic exercises, 97530- Therapeutic activity, [id number]- Neuromuscular re-education, 97535- Self Care, [id number]- Manual therapy, [id number]- Gait training, [phone]- Orthotic Initial, [phone]- Orthotic/Prosthetic subsequent, [phone]- Canalith repositioning, [id number]- Aquatic Therapy, [phone]- Electrical stimulation (unattended), [phone]- Electrical stimulation (manual), [id number]- Vasopneumatic device, [id number]- Ultrasound, [id number]- Traction (mechanical), [id number]- Ionotophoresis 4mg /ml Dexamethasone , [id number] (1-2 muscles), [id number] (3+ muscles)- Dry Needling, Patient/Family education, Balance training, Stair training, Taping, Joint mobilization, Joint manipulation, Spinal manipulation, Spinal mobilization, Vestibular training, DME instructions, Cryotherapy, and Moist heat.  PLAN FOR NEXT SESSION: Extension emphasis secondary to positive straight leg raise test and likely lumbar HNP.  Posture and body mechanics work along with low back strengthening to meet long-term goals.  Update patient-specific functional scale and spine strength test.  Jetty will probably need an extension to her current plan as her home program participation has been poor and she will probably need an extension of another month.   Myer LELON Ivory, PT, MPT 03/03/24 11:41 AM

## 2024-03-07 ENCOUNTER — Telehealth: Payer: Self-pay | Admitting: Physical Therapy

## 2024-03-07 ENCOUNTER — Encounter: Admitting: Physical Therapy

## 2024-03-07 NOTE — Telephone Encounter (Signed)
 Called patient after 15 mins no show for appointment today.  Left message and reminder of next appointment time 03/13/24 at 8:00, as well as call back number. [phone] Delon Lunger, PT, MPT 03/07/24 8:32 AM

## 2024-03-08 NOTE — Therapy (Incomplete)
 OUTPATIENT PHYSICAL THERAPY THORACOLUMBAR TREATMENT   Patient Name: Shelly [name] MRN: [id number] DOB:Apr 23, 1985, 38 y.o., female Today's Date: 03/08/2024  END OF SESSION:       Past Medical History:  Diagnosis Date   Anemia    Anxiety    Chronic kidney disease    Endometriosis of pelvis 04/21/2012   Diagnosed on pathology after operative laparoscopy on 04/21/2012    PONV (postoperative nausea and vomiting)    Past Surgical History:  Procedure Laterality Date   BREAST ENHANCEMENT SURGERY     BREAST SURGERY     CESAREAN SECTION  09/17/00, 08/31/02, 06/14/08   x 3   CESAREAN SECTION N/A 11/29/2017   Procedure: Repeat CESAREAN SECTION;  Surgeon: Gorge Ade, MD;  Location: [facility];  Service: Obstetrics;  Laterality: N/A;  EDD: 12/05/17   CESAREAN SECTION N/A 11/06/2020   Procedure: Repeat CESAREAN SECTION;  Surgeon: Gorge Ade, MD;  Location: [facility];  Service: Obstetrics;  Laterality: N/A;  EDD: 11/10/20   CHROMOPERTUBATION  04/21/2012   Procedure: CHROMOPERTUBATION;  Surgeon: Gloris DELENA Hugger, MD;  Location: [facility];  Service: Gynecology;  Laterality: N/A;   LAPAROSCOPIC LYSIS OF ADHESIONS  04/21/2012   Procedure: LAPAROSCOPIC LYSIS OF ADHESIONS;  Surgeon: Gloris DELENA Hugger, MD;  Location: [facility];  Service: Gynecology;  Laterality: N/A;   LAPAROSCOPY  04/21/2012   Procedure: LAPAROSCOPY OPERATIVE;  Surgeon: Gloris DELENA Hugger, MD;  Location: [facility];  Service: Gynecology;  Laterality: N/A;  Peritoneal biopsies   Patient Active Problem List   Diagnosis Date Noted   Iron  deficiency 06/08/2023   Acute blood loss anemia (ABLA) 11/07/2020   Previous cesarean section 11/06/2020   Status post repeat low transverse cesarean section 7/27 11/06/2020   Postpartum care following cesarean delivery 7/27 11/06/2020   Anxiety 11/06/2020   History of anemia 11/06/2020   Previous cesarean delivery affecting pregnancy 11/06/2020    PCP: Tari ONEIDA Banter PA-C  REFERRING  PROVIDER: Lonell Sprang, DO  REFERRING DIAG: [phone] (ICD-10-CM) - Chronic right-sided low back pain with right-sided sciatica M51.362 (ICD-10-CM) - Degeneration of intervertebral disc of lumbar region with discogenic back pain and lower extremity pain  Rationale for Evaluation and Treatment: Rehabilitation  THERAPY DIAG:  No diagnosis found.  ONSET DATE: Summer 2025   SUBJECTIVE:                                                                                                                                                                                           SUBJECTIVE STATEMENT: *** Shelly [name] continues to struggle with her home exercise program compliance.  She notes she has a lot  going on at work as well as trying to close on a house with multiple problems.  Despite this, I encouraged her to increase her home exercise program compliance as she is unlikely to meet long-term goals with her current level of participation.  PERTINENT HISTORY:  Patient reports onset of low back pain after a busy day then had a child land of her legs causing them to increase Rt rotation of the back with trunk staying stationary that lead to immediate onset of pain. Patient endorses pain that can intermittently radiate to Rt ankle. Patient endorses spending a lot of time flat on the ground and pain can get bad enough to need PTO. Patient reports no previous injuries, trauma, or surgeries to the area or surrounding joints.  PAIN:  Are you having pain? Yes: NPRS scale: Can still be 3-5.5/10 (was 6-10/10)  Pain location: Rt sided low back pain, Rt hip, as distal as the ankle  Pain description: Spasms, burning  Aggravating factors: sitting for prolonged periods of times, driving, household chores, ADLs Relieving factors: lying flat on the floor, lumbar extension  PRECAUTIONS: None  RED FLAGS: Bowel or bladder incontinence: No and Cauda equina syndrome: No   WEIGHT BEARING RESTRICTIONS: No  FALLS:  Has  patient fallen in last 6 months? 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  DIAGNOSTIC FINDINGS:   X-rays demonstrate 5  nonrib-bearing lumbar vertebrae.  There is mild degenerative disc disease  at the L4-L5 level.  There is mild anterior listhesis of L5 on S1.  There  is no instability with flexion/extension views.   PATIENT SURVEYS:  PSFS: THE PATIENT SPECIFIC FUNCTIONAL SCALE  Place score of 0-10 (0 = unable to perform activity and 10 = able to perform activity at the same level as before injury or problem)  Activity Date: 02/08/2024    Household chores  2    2. Driving  4    3. Being more present  1    4.      Total Score 2.33      Total Score = Sum of activity scores/number of activities  Minimally Detectable Change: 3 points (for single activity); 2 points (for average score)  [facility] (nd). The Patient Specific Functional Scale . Retrieved from [email or web address]   COGNITION: Overall cognitive status: Within functional limits for tasks assessed     SENSATION: WFL  MUSCLE LENGTH: Not objectively measured, but tight   POSTURE: No Significant postural limitations  PALPATION: TTP in Rt lumbar musculature and Rt glute   LUMBAR ROM: painful in all direction in Rt lumbar musculature   AROM Eval 02/08/2024  Flexion WFL  Extension WFL  Right lateral flexion WFL  Left lateral flexion WFL  Right rotation [facility]  Left rotation WFL    (Blank rows = not tested)  LOWER EXTREMITY ROM:     Passive   Right  02/18/2024 Left  02/18/2024 Left/Right 02/25/2024  Hip flexion 90 105 110/110  Hip extension     Hip abduction     Hip adduction     Hip internal rotation 15 14 20/19  Hip external rotation 36 46 42/41  Knee flexion     Knee extension     Ankle dorsiflexion     Ankle plantarflexion     Ankle inversion     Ankle eversion     Hamstrings 40 40 50/50   (Blank rows = not tested)  LOWER EXTREMITY MMT:    MMT Right Eval 02/08/2024 Left Eval 02/08/2024 02/18/2024  Hip flexion 4+/5 4+/5   Hip extension 4/5 4+/5   Hip abduction 5/5 5/5   Hip adduction     Hip internal rotation     Hip external rotation     Knee flexion 5/5 5/5   Knee extension 5/5 5/5   Ankle dorsiflexion 5/5 5/5   Ankle plantarflexion     Ankle inversion     Ankle eversion     Lumbar extension   10 seconds   (Blank rows = not tested)  LUMBAR SPECIAL TESTS:  Straight leg raise test: Positive and Slump test: Positive FABER: no pain noted  FADIR: endorsing pain in Rt low back   FUNCTIONAL TESTS:    GAIT: Distance walked: not formally assessed  Assistive device utilized: None Level of assistance: supervision  Comments: no overt deviations noted   TREATMENT DATE:  03/13/2024 ***   03/03/2024 Single knee to chest stretch with the other leg straight 4 x 15 seconds Supine hamstrings stretch with the other leg straight 4 x 15 seconds Figfure 4 stretch 4 x 15 seconds Lumbar extension AROM, hands low and hips forward within comfortable range 10 x 3 seconds Yoga Bridge 10 for 5 seconds Prone alternating hip extension 2 sets of 10 for 5 seconds Hip abduction 2 sets of 10 for 3 seconds in side-lie  Functional Activities: Hip hike in door frame 2 sets of 5 x 3 seconds (postural correction/endurance, needs feedback to avoid hyperextension of the stance leg) Pull to chest/Rows Blue Thera-Band 20 x 3 seconds (posture) Quick review of correct body mechanics and reminder to prioritize home exercise  program and home walking participation to meet long-term goals    02/25/2024 Single knee to chest stretch with the other leg straight 4 x 15 seconds Supine hamstrings stretch with the other leg straight 4 x 15 seconds Figfure 4 stretch 4 x 15 seconds Lumbar extension AROM, hands low and hips forward within comfortable range 10 x 3 seconds Yoga Bridge 10 for 5 seconds Prone alternating hip extension 2 sets of 10 for 5 seconds  Functional Activities: Hip hike in door frame 2 sets of 5 x 3 seconds (postural correction/endurance); review of correct body mechanics for bed mobility including logroll; reminders to use a lumbar roll when seated and reclined when possible; reminders to change position frequently and spread out lumbar extension throughout the day; reminder to prioritize home exercise program and home walking participation to meet long-term goals   02/18/2024 Pelvic rotation 10 x 5 seconds Single knee to chest stretch with the other leg straight 4 x 15 seconds Supine hamstrings stretch with the other leg straight 4 x 15 seconds Figfure 4 stretch 4 x 15 seconds Lumbar extension AROM, hands low and hips forward within comfortable range 10 x 3 seconds Yoga Bridge 2 sets of 10 for 5 seconds Prone alternating hip extension 2 sets of 10 for 3 seconds  Functional Activities: Practical logroll for bed mobility; discussed the importance of frequent changes of position at work; discussed the importance of avoiding slouched and flexed postures; discussed home walking prescription of 3  times a week for 20+ minutes; discussed spine anatomy with the model and mechanism of reducing a lumbar HNP; reviewed and made appropriate modifications to her day 1 home exercises   PATIENT EDUCATION:  Education details: HEP, POC, dry needling Person educated: Patient Education method: Explanation, Demonstration, Tactile cues, Verbal cues, and Handouts Education comprehension: verbalized understanding,  returned demonstration, verbal cues required, and tactile cues required  HOME EXERCISE PROGRAM: Access Code: [email or web address] URL: [email or web address] Date: 02/25/2024 Prepared by: Lamar Ivory  Exercises - Supine Lower Trunk Rotation  - 1 x daily - 1 x weekly - 2 sets - 10 reps - 5s hold - Supine Single Knee to Chest Stretch  - 1 x daily - 1 x weekly - 1 sets - 4-5 reps - 15 seconds hold - Supine Double Knee to Chest  - 1 x daily - 1 x weekly - 2 sets - 30s hold - Supine Figure 4 Piriformis Stretch  - 1 x daily - 1 x weekly - 1 sets - 4-5 reps - 15 seconds hold - Supine Bridge  - 1-2 x daily - 1 x weekly - 2 sets - 10 reps - 5 seconds hold - Seated Slump Nerve Glide  - 1 x daily - 1 x weekly - 3 sets - 10 reps - Standing Lumbar Extension at Wall - Forearms  - 5 x daily - 7 x weekly - 1 sets - 5 reps - 3 seconds hold - Supine Hamstring Stretch  - 1-2 x daily - 7 x weekly - 1 sets - 4-5 reps - 15 seconds hold - Prone Hip Extension  - 1-2 x daily - 7 x weekly - 2-3 sets - 10 reps - 3 seconds hold - Standing Hip Hiking  - 5 x daily - 7 x weekly - 1 sets - 5-10 reps - 3 seconds hold  ASSESSMENT:  CLINICAL IMPRESSION: *** Shelly [name] does note periods of time during the day where she is not in as much pain.  Typically, this is when she is changing positions and participating in her home exercises.  Because of several things going on at work and at home, her home exercise program participation has not been as good as recommended.  If Shelly [name] can increase her home exercise program participation, I am confident she will be able to reduce her large L5-S1 disc and meet long-term goals established at evaluation.  With her current participation though, she will probably need an extension to her plan of care.  Patient is a 38 y.o. F who was seen today for physical therapy evaluation and treatment for chronic Rt low back pain with radiculopathy presenting with generalized strength deficits, functional  mobility deficits, and high levels of pain. Patient is mainly limited secondary to high levels of pain. Patient will benefit from skilled PT to address above noted deficits.   OBJECTIVE IMPAIRMENTS: decreased activity tolerance, decreased coordination, decreased ROM, decreased strength, increased muscle spasms, and pain.   ACTIVITY LIMITATIONS: carrying, lifting, bending, sitting, standing, squatting, stairs, bed mobility, and caring for others  PARTICIPATION LIMITATIONS: cleaning, laundry, driving, community activity, and occupation  PERSONAL FACTORS: Time since onset of injury/illness/exacerbation and 3+ comorbidities: anemia, anxiety, kidney disease are also affecting patient's functional outcome.   REHAB POTENTIAL: Good  CLINICAL DECISION MAKING: Stable/uncomplicated  EVALUATION COMPLEXITY: Low   GOALS: Goals reviewed with patient? Yes  SHORT TERM GOALS: Target date: 02/29/2024  Patient will show compliance with initial HEP. Baseline: Goal status: Ongoing 03/03/2024  2.  Patient will  report pain levels no greater than 6/10 in order to show improved overall quality of life. Baseline:  Goal status: Partially met 03/03/2024   LONG TERM GOALS: Target date: 03/21/2024  Patient will be independent with final HEP in order to maintain and progress upon functional gains made within PT. Baseline:  Goal status: INITIAL  2.  Patient will report pain levels no greater than 4/10 in order to show improved overall quality of life. Baseline:  Goal status: INITIAL  3.  Patient will increase PSFS to at least 4.33 in order to show a significant improvement in subjective disability rating. Baseline:  Goal status: INITIAL  4.  Patient will increase bilat hip extension strength to at least 4+/5 in order to improve biomechanics with functional mobility. Baseline:  Goal status: INITIAL  5.  Patient will report ability to make it through one full day of work with no increase in  pain. Baseline:  Goal status: INITIAL    PLAN:  PT FREQUENCY: 1x/week  PT DURATION: 6 weeks  PLANNED INTERVENTIONS: [id number]- PT Re-evaluation, 97750- Physical Performance Testing, 97110-Therapeutic exercises, 97530- Therapeutic activity, [id number]- Neuromuscular re-education, 97535- Self Care, [id number]- Manual therapy, [id number]- Gait training, [phone]- Orthotic Initial, [phone]- Orthotic/Prosthetic subsequent, [phone]- Canalith repositioning, [phone]- Aquatic Therapy, [id number]- Electrical stimulation (unattended), [phone]- Electrical stimulation (manual), [id number]- Vasopneumatic device, [id number]- Ultrasound, [id number]- Traction (mechanical), [id number]- Ionotophoresis 4mg /ml Dexamethasone , [id number] (1-2 muscles), [id number] (3+ muscles)- Dry Needling, Patient/Family education, Balance training, Stair training, Taping, Joint mobilization, Joint manipulation, Spinal manipulation, Spinal mobilization, Vestibular training, DME instructions, Cryotherapy, and Moist heat.  PLAN FOR NEXT SESSION: Extension emphasis secondary to positive straight leg raise test and likely lumbar HNP.  Posture and body mechanics work along with low back strengthening to meet long-term goals.  Update patient-specific functional scale and spine strength test.  Jermya will probably need an extension to her current plan as her home program participation has been poor and she will probably need an extension of another month.   Susannah Daring, PT, DPT 03/08/24 10:50 AM

## 2024-03-13 ENCOUNTER — Telehealth: Payer: Self-pay

## 2024-03-13 ENCOUNTER — Encounter

## 2024-03-13 NOTE — Telephone Encounter (Signed)
 PT called patient as she was a no-show for this morning's session at 0800. PT left voicemail reminding patient that today was last scheduled appointment and to call back to reschedule.   Susannah Daring, PT, DPT 03/13/24 8:21 AM
